# Patient Record
Sex: Female | Born: 1960
Health system: Southern US, Community
[De-identification: ages and names within clinical notes are randomized; demographics above are authoritative.]

## PROBLEM LIST (undated history)

## (undated) DIAGNOSIS — M779 Enthesopathy, unspecified: Secondary | ICD-10-CM

## (undated) DIAGNOSIS — E119 Type 2 diabetes mellitus without complications: Secondary | ICD-10-CM

## (undated) DIAGNOSIS — G8929 Other chronic pain: Secondary | ICD-10-CM

## (undated) DIAGNOSIS — K219 Gastro-esophageal reflux disease without esophagitis: Secondary | ICD-10-CM

## (undated) DIAGNOSIS — M549 Dorsalgia, unspecified: Secondary | ICD-10-CM

## (undated) DIAGNOSIS — M778 Other enthesopathies, not elsewhere classified: Secondary | ICD-10-CM

## (undated) DIAGNOSIS — I10 Essential (primary) hypertension: Secondary | ICD-10-CM

## (undated) HISTORY — PX: BREAST BIOPSY: SHX20

## (undated) HISTORY — PX: ABDOMINAL HYSTERECTOMY: SHX81

## (undated) HISTORY — PX: ABDOMINAL SURGERY: SHX537

---

## 2007-04-05 ENCOUNTER — Ambulatory Visit: Payer: Self-pay | Admitting: Unknown Physician Specialty

## 2007-04-08 ENCOUNTER — Inpatient Hospital Stay: Payer: Self-pay | Admitting: Unknown Physician Specialty

## 2007-04-19 ENCOUNTER — Inpatient Hospital Stay: Payer: Self-pay | Admitting: Unknown Physician Specialty

## 2008-01-01 ENCOUNTER — Emergency Department: Payer: Self-pay | Admitting: Internal Medicine

## 2008-11-08 ENCOUNTER — Emergency Department: Payer: Self-pay | Admitting: Internal Medicine

## 2008-11-15 LAB — METABOLIC PANEL, COMPREHENSIVE
A-G Ratio: 0.5 — ABNORMAL LOW (ref 1.1–2.2)
ALT (SGPT): 40 U/L (ref 30–65)
AST (SGOT): 67 U/L — ABNORMAL HIGH (ref 15–37)
Albumin: 2.9 g/dL — ABNORMAL LOW (ref 3.5–5.0)
Alk. phosphatase: 134 U/L (ref 50–136)
Anion gap: 5 mmol/L (ref 5–15)
BUN/Creatinine ratio: 15 (ref 12–20)
BUN: 26 MG/DL — ABNORMAL HIGH (ref 6–20)
Bilirubin, total: 0.3 MG/DL (ref <1.0)
CO2: 27 MMOL/L (ref 21–32)
Calcium: 8.6 MG/DL (ref 8.5–10.1)
Chloride: 101 MMOL/L (ref 97–108)
Creatinine: 1.7 MG/DL — ABNORMAL HIGH (ref 0.6–1.3)
GFR est AA: 41 mL/min/{1.73_m2} — ABNORMAL LOW (ref >60)
GFR est non-AA: 34 mL/min/{1.73_m2} — ABNORMAL LOW (ref >60)
Globulin: 5.6 g/dL — ABNORMAL HIGH (ref 2.0–4.0)
Glucose: 106 MG/DL — ABNORMAL HIGH (ref 50–100)
Potassium: 4.7 MMOL/L (ref 3.5–5.1)
Protein, total: 8.5 g/dL — ABNORMAL HIGH (ref 6.4–8.2)
Sodium: 133 MMOL/L — ABNORMAL LOW (ref 136–145)

## 2008-11-15 LAB — CK W/ REFLX CKMB: CK: 991 U/L — ABNORMAL HIGH (ref 21–215)

## 2008-11-15 LAB — CBC WITH AUTOMATED DIFF
ABS. BASOPHILS: 0 10*3/uL (ref 0.0–0.1)
ABS. EOSINOPHILS: 0 10*3/uL (ref 0.0–0.4)
ABS. LYMPHOCYTES: 0.8 10*3/uL (ref 0.8–3.5)
ABS. MONOCYTES: 0.3 10*3/uL (ref 0–1.0)
ABS. NEUTROPHILS: 5.8 10*3/uL (ref 1.8–8.0)
BASOPHILS: 0 % (ref 0–1)
EOSINOPHILS: 0 % (ref 0–7)
HCT: 33 % — ABNORMAL LOW (ref 35.0–47.0)
HGB: 11.3 g/dL — ABNORMAL LOW (ref 11.5–16.0)
LYMPHOCYTES: 12 % (ref 12–49)
MCH: 30.7 PG (ref 26.0–34.0)
MCHC: 34.2 g/dL (ref 30.0–35.0)
MCV: 89.7 FL (ref 80.0–99.0)
MONOCYTES: 4 % — ABNORMAL LOW (ref 5–13)
NEUTROPHILS: 84 % — ABNORMAL HIGH (ref 32–75)
PLATELET: 267 10*3/uL (ref 150–400)
RBC: 3.68 M/uL — ABNORMAL LOW (ref 3.80–5.20)
RDW: 15.4 % — ABNORMAL HIGH (ref 11.5–14.5)
WBC: 6.9 10*3/uL (ref 3.6–11.0)

## 2008-11-15 LAB — URINALYSIS W/ REFLEX CULTURE
Bacteria: NEGATIVE /HPF
Bilirubin: NEGATIVE
Glucose: NEGATIVE MG/DL
Ketone: NEGATIVE MG/DL
Leukocyte Esterase: NEGATIVE
Nitrites: NEGATIVE
Protein: 300 MG/DL — AB
Specific gravity: 1.014 (ref 1.003–1.030)
Urobilinogen: 0.2 EU/DL (ref 0.2–1.0)
pH (UA): 6 (ref 5.0–8.0)

## 2008-11-15 LAB — CK-MB,QUANT.
CK - MB: 1.9 NG/ML (ref 0–5)
CK-MB Index: 0.2 (ref 0–2.5)

## 2008-11-15 LAB — GRAM STAIN

## 2008-11-15 LAB — TROPONIN I: Troponin-I, Qt.: <0.04 ng/mL (ref 0.0–0.05)

## 2008-11-15 LAB — NT-PRO BNP: NT pro-BNP: 1249 PG/ML — ABNORMAL HIGH (ref 0–125)

## 2008-11-15 LAB — D DIMER: D-dimer: 3.46 MG/L — ABNORMAL HIGH (ref 0.0–3.0)

## 2008-11-15 NOTE — Consults (Signed)
Name: Cheyenne Owens, Cheyenne Owens Admitted: 11/15/2008  MR #: [id number] DOB: 04-14-1961  Account #: [id number] Age: 47  Consultant: Casimiro Needle A. Yug Loria, M.D. Location:     CONSULTATION REPORT    DATE OF CONSULTATION: 11/15/2008  REFERRING PHYSICIAN:      PROBLEM: Pneumonia.    HISTORY OF PRESENT ILLNESS: The patient is a 47 year old black female who  presents for evaluation of increasing cough and dyspnea. She states that  she was in her usual state of health until last Thursday. She developed a  scratchy throat. Through the weekend, she had almost persistent  temperature of 101.3 responsive only to Advil. She has had originally green  and yellow and then pinkish sputum production. Her cough is largely  nonproductive at this time. She has a history of reactive airway disease  and asthma. She is normally on Advair. She normally has good control of her  reactive airway disease. She has not received the flu vaccination or the  H1N1 vaccine. She has had some chest aching with cough but not otherwise.  She has chronic lower extremity edema. She has no foreign travel history.  She remains a nonsmoker. [facility] and [facility]  steroids and azithromycin. She is compliant with her CPAP device.    PAST MEDICAL HISTORY: Obstructive sleep apnea, obesity, hypertension,  atrial fibrillation, cardiomegaly, asthma.    MEDICATIONS AT HOME:  1. Advair.  2. Prednisone.  3. Azithromycin.  4. Labetalol.    ALLERGIES: The patient states that she developed skin flaking with  PENICILLIN.    SOCIAL HISTORY: The patient is unmarried. She has no children. She cares  for her mother with dementia and sister who currently has renal failure on  dialysis. She is a nonsmoker and does not drink alcohol. She works as a  Biomedical engineer.    FAMILY HISTORY: Noncontributory.    REVIEW OF SYSTEMS: Constitutional, HEENT, cardiovascular, pulmonary,  gastrointestinal, genitourinary, musculoskeletal, integument, neurologic,   and endocrinologic review of systems as per the history of the present  illness with the following additions. The patient has had urinary  incontinence with coughing. She has frequent retention of fluid within the  lower extremities. She remains overweight. Otherwise standard findings.    PHYSICAL EXAMINATION:  GENERAL: The patient is a morbidly obese black female resting comfortably.  VITAL SIGNS: Blood pressure 179/100, pulse 82, respirations 26,  temperature 99.7.  HEENT: Normocephalic, atraumatic. Pupils equal, round and reactive to  light. Sclerae anicteric. Conjunctivae not injected. Ears and nose without  drainage. Mouth: No oral lesions.  NECK: No lymphadenopathy or stridor.  BACK: Without CVA tenderness or spinal tenderness.  CHEST: Equal breath sounds bilaterally throughout. End-expiratory wheeze,  occasional rales. No rhonchi.  CARDIAC: Regular. Faint S1, S2. No appreciable murmur, rub, or gallop.  ABDOMEN: Obese, soft, nontender. No rigidity.  EXTREMITIES: Without cyanosis or clubbing. There is bilateral lower  extremity pedal edema to the knees.  SKIN: Warm and dry. No generalized rash or lesions.  NEUROLOGICAL: The patient is alert and oriented. She moves all extremities.      IMAGING: Chest x-ray is reviewed. The patient has extensive bilateral  infiltrates, right greater than left, and cardiomegaly without significant  effusions.    LABORATORY DATA: Sodium 137, potassium 4.7, chloride 101, bicarbonate 28,  BUN 26, creatinine 1.7, glucose 106. White count 6.9, hemoglobin 11.3,  platelets 267,000. D-dimer is 3.46.    ASSESSMENT:  1. Multilobar pneumonia.  2. Respiratory failure with hypoxemia.  3. Morbid obesity.  4. Obstructive sleep apnea.  5. Hypertension.  6. Asthma with mild exacerbation.  7. Atrial fibrillation.  8. Lack of prior flu vaccination.    PLAN:  1. Broad-spectrum antibiotics.  2. Supplement oxygen therapy.  3. Followup chest x-ray.   4. BIPAP at night. The patient has been advised to have her sister bring  her BIPAP machine to the hospital.  5. Wean steroids as able.  6. Cough suppressants.  7. Sputum Gram stain and culture.  8. Influenza vaccinations and Pneumococcal vaccine prior to discharge.  9. Bronchodilator therapy for wheezing.    The patient is at high risk for progressive respiratory failure. Chest  x-ray is very worrisome. She is not thought to have opportunistic  infection or aspiration. Congestive heart failure is thought to be less  likely. She may require bronchoscopy.    Thank you for this consultation.          Electronically Signed By  Rudy Jew Lucienne Capers, M.D. 11/27/2008 22:07    Casimiro Needle A. Tamarcus Condie, M.D.    cc: Fredirick Lathe, M.D.   Casimiro Needle A. Liam Graham, M.D.      MAM/WMX; D: 11/15/2008 3:02 P; T: 11/15/2008 4:08 P; DOC# [id number]; Job#  [id number]

## 2008-11-15 NOTE — H&P (Signed)
Name: Cheyenne Owens, Cheyenne Owens Admitted: 11/15/2008  MR #: [id number] DOB: 05-17-1961  Account #: [id number] Age: 47  Physician: Shireen Quan, M.D. Location: [id number]     HISTORY PHYSICAL      PRIMARY CARE PHYSICIAN: [facility].    CHIEF COMPLAINT: Cough.    HISTORY OF PRESENT ILLNESS: This 47 year old female is admitted for acute  hypoxic respiratory failure with a room air oxygen saturation of 86%. She  had been found in the emergency department to have pneumonia and possibly  congestive heart failure and was started on Solu-Medrol, nebulized  bronchodilators, Levaquin, and Lasix.    The patient has a long-standing history of asthma and uses Advair  routinely. Last week, she had developed a sore throat and cough which had  led to an asthma exacerbation. She had subsequently developed yellow/green  sputum. She had been seen at [facility] on November 19th and was  discharged with prescriptions for Z-PAK, prednisone Dosepak, albuterol and  Advair. From November 20th through the 23rd, she ran fevers in the 101.5  range which she treated with Tylenol and Advil. The fever subsequently  resolved on November 23rd. Unfortunately, she continued to have a frequent  cough. Not only has that resulted in marked musculoskeletal pain in her  chest and abdominal wall, but it is also resulted in stress urinary  incontinence with the urine at times flowing out "like a fountain." In  addition, the patient has had shortness of breath. She notes that her  sputum has developed a pink tinge to it. Because of her worsening  shortness of breath, she presented to the emergency department.    She has had no midsternal chest heaviness, nausea, vomiting or diaphoresis.  She has had no nausea, vomiting, or diarrhea.    PAST MEDICAL HISTORY:  1. Morbid obesity.  2. Hypertension.  3. Chronic kidney disease, stage 3 with a baseline creatinine from  February 2008 of 1.6.  4. Cardiomegaly diagnosed at least one year ago but never evaluated.   5. Gout which was brought on by Lasix use.    MEDICATIONS PRIOR TO ADMISSION:  1. Labetalol 200 mg 2 b.i.d.  2. Advair Diskus.  3. Medrol Dosepak.  4. Albuterol.  5. The patient has completed her Z-PAK.    ALLERGIES: PENICILLIN.    SOCIAL HISTORY: The patient does not use tobacco or alcohol. She lives  with her mother and sister.    FAMILY HISTORY: Her mother has severe Alzheimer's disease. Her sister has  diabetes, was recently admitted to [facility] with  sepsis, and was started on hemodialysis therapy.    REVIEW OF SYSTEMS: CONSTITUTIONAL: Positive for fevers. EYES: No  blurred vision. EARS, NOSE, MOUTH AND THROAT: No sinus discharge.  Positive for a tickle in her throat. CARDIAC: No midsternal chest  heaviness. RESPIRATORY: Positive for productive cough and shortness of  breath. GI: No nausea or vomiting. GU: Positive for stress  incontinence. MUSCULOSKELETAL: No muscle or joint pains. NEURO: No  headaches or loss of consciousness. SKIN: No rashes or lesions. All  other systems negative.    PHYSICAL EXAMINATION:  GENERAL: Reveals a morbidly obese female who is alert and oriented x3, and  in no acute distress, lying still and wearing supplemental oxygen. There  is no accessory muscle use. She is not anxious or agitated.  VITAL SIGNS: Included temperature 99.7, pulse 82, respirations 26, blood  pressure 179/101, oxygen saturation 86% on room air.  HEENT: Head normocephalic, atraumatic. Pupils equal, round, reactive  to  light. Extraocular muscles intact. No conjunctival drainage or scleral  icterus. No external lesions on the nose or ears. Oral mucosa moist.  NECK: Supple. Trachea midline. No masses or palpable lymph nodes.  Carotids 2+ bilaterally.  HEART: Regular in rate and rhythm without murmurs, gallops, or rubs.  LUNGS: Reveal crackles bilaterally.  ABDOMEN: Obese, soft, nontender. Bowel sounds present. No masses or  palpable organomegaly.  EXTREMITIES: Without pretibial or pedal edema.   SKIN: Without rashes or lesions.  NEURO: Cranial nerves II-XII intact. Motor exam nonfocal.    LABORATORY DATA: White count 6.9, hemoglobin 11.3, hematocrit 33.0,  platelets 267, MCV 89.7, 84 segs, 12 lymphs, 4 monos. Sodium 133,  potassium 4.7, chloride 101, bicarb 27, glucose 106, BUN 26, creatinine  1.7. The BUN and creatinine in February 2008 were 19 and 1.6. Liver  enzymes notable for AST of 67. CPK 991 with MB index of 0.2, troponin-I  less than 0.04. D-dimer 3.46. Urinalysis was clear. Chest x-ray showed  diffuse infiltrates which involved almost the entire right lung and  primarily the bottom half of the left lung. EKG showed sinus rhythm,  nonspecific ST changes.    ASSESSMENT AND PLAN:  1. Acute hypoxic respiratory failure. The patient will be continued on  supplemental oxygen.  2. Asthma. The patient has no wheezing at this time. She will be  continued on Solu-Medrol and nebulized bronchodilators in view of her acute  hypoxic respiratory failure.  3. Pneumonia. Levaquin will be continued and sputum studies requested.  Consultation will be placed to pulmonary for possible bronchoscopy.  4. Cardiomegaly. An echocardiogram will be obtained to evaluate heart  function. The patient may very well have core pulmonale in view of her  morbid obesity and likely sleep apnea. Nevertheless, because of the lung  findings suggestive of congestive heart failure, the patient will be  continued on Lasix therapy. No ACE inhibitors will be initiated in spite  of the possibility of congestive heart failure due to the risk of causing  acute renal in this patient with chronic renal failure who is being  diuresed.  5. Hypertension. For the present time, the patient will continue on her  usual labetalol dose.  6. Chronic kidney disease stage 3. Will need to monitor the patient's  renal function closely as she is diuresed.  7. Anemia. This may be related to her chronic kidney disease. Her MCV   suggests that her iron stores are adequate.  8. Prophylaxis. Protonix will be ordered for GI protection and sequential  compression devices will be ordered for DVT prophylaxis. I would like to  avoid Lovenox in view of the patient's sputum being pink tinged and in view  of the possibility of her needing to have bronchoscopy.  9. Morbid obesity. Unfortunately, the patient's long-term outlook is very  poor due to the complications which occur as a result of morbid obesity.    Total time here was 80 minutes.          E-Signed By  Shireen Quan, M.D. 11/15/2008 19:26    Shireen Quan, M.D.    cc: Shireen Quan, M.D.        RT/WMX; D: 11/15/2008 4:17 A; T: 11/15/2008 7:55 A; DOC# [id number]; Job#  [id number]

## 2008-11-15 NOTE — Procedures (Signed)
Name: Cheyenne Owens, Cheyenne Owens Admitted: 11/15/2008  MR #: [id number] DOB: 07-24-1961  Account #: [id number] Age 47  Ref MD: Location: [id number]     ECHOCARDIOGRAPHY REPORT    STUDY DATE: 11/15/2008    PROCEDURE: 2D and Doppler echocardiogram.    INDICATIONS: Congestive heart failure, respiratory failure.    FINDINGS:  1. Technically suboptimal study secondary to poor acoustic window.  2. Normal left ventricular size and systolic function with an estimated  ejection fraction of 60% to 65%. No obvious regional wall motion  abnormalities are seen.  3. The right ventricle is not well seen but does not appear significantly  enlarged.  4. The left atrium is at the upper limit of normal in size.  5. The aortic root does not appear enlarged.  6. The aortic valve is unremarkable in appearance without evidence of  significant stenosis or regurgitation.  7. The mitral valve is mildly thickened with evidence of some mitral  annular calcification and mild mitral regurgitation.  8. The mitral valve is unremarkable in appearance with mild mitral  regurgitation. Estimated PA systolic pressure is around 50 mmHg suggesting  mild pulmonary systolic hypertension.  9. No obvious intracardiac masses, thrombi or vegetations.  10. No pericardial effusion is seen.  11. There is Doppler evidence of at least a mild degree of left  ventricular diastolic dysfunction.    SUMMARY: Technically suboptimal study secondary to poor acoustic window.  Grossly normal left ventricular size and systolic function. No significant  valvular abnormalities seen. Mild pulmonary systolic hypertension by  Doppler estimate. Evidence of at least mild left ventricular diastolic  dysfunction by Doppler evaluation.        E-Signed By  Jasmine Awe, M.D. 12/01/2008 22:32    Jasmine Awe, M.D.    cc: Fredirick Lathe, M.D.   Jasmine Awe, M.D.       [facility] Stress Lab*    BKH/WMX; D: 11/15/2008 11:01 A; T: 11/15/2008 3:57 P; DOC# [id number]; Job#  [id number]

## 2008-11-16 LAB — METABOLIC PANEL, BASIC
Anion gap: 9 mmol/L (ref 5–15)
BUN/Creatinine ratio: 23 — ABNORMAL HIGH (ref 12–20)
BUN: 49 MG/DL — ABNORMAL HIGH (ref 6–20)
CO2: 26 MMOL/L (ref 21–32)
Calcium: 8.9 MG/DL (ref 8.5–10.1)
Chloride: 100 MMOL/L (ref 97–108)
Creatinine: 2.1 MG/DL — ABNORMAL HIGH (ref 0.6–1.3)
GFR est AA: 32 mL/min/{1.73_m2} — ABNORMAL LOW (ref >60)
GFR est non-AA: 27 mL/min/{1.73_m2} — ABNORMAL LOW (ref >60)
Glucose: 162 MG/DL — ABNORMAL HIGH (ref 50–100)
Potassium: 3.8 MMOL/L (ref 3.5–5.1)
Sodium: 135 MMOL/L — ABNORMAL LOW (ref 136–145)

## 2008-11-16 LAB — CBC W/O DIFF
HCT: 34 % — ABNORMAL LOW (ref 35.0–47.0)
HGB: 11.9 g/dL (ref 11.5–16.0)
MCH: 30.4 PG (ref 26.0–34.0)
MCHC: 35 g/dL (ref 30.0–35.0)
MCV: 87 FL (ref 80.0–99.0)
PLATELET: 260 10*3/uL (ref 150–400)
RBC: 3.91 M/uL (ref 3.80–5.20)
RDW: 15.1 % — ABNORMAL HIGH (ref 11.5–14.5)
WBC: 8.1 10*3/uL (ref 3.6–11.0)

## 2008-11-16 LAB — MAGNESIUM: Magnesium: 1.8 MG/DL (ref 1.6–2.4)

## 2008-11-17 LAB — CULTURE, RESPIRATORY/SPUTUM/BRONCH W GRAM STAIN: Culture result:: NORMAL

## 2008-11-18 LAB — METABOLIC PANEL, BASIC
Anion gap: 9 mmol/L (ref 5–15)
BUN/Creatinine ratio: 28 — ABNORMAL HIGH (ref 12–20)
BUN: 55 MG/DL — ABNORMAL HIGH (ref 6–20)
CO2: 27 MMOL/L (ref 21–32)
Calcium: 8.9 MG/DL (ref 8.5–10.1)
Chloride: 104 MMOL/L (ref 97–108)
Creatinine: 2 MG/DL — ABNORMAL HIGH (ref 0.6–1.3)
GFR est AA: 34 mL/min/{1.73_m2} — ABNORMAL LOW (ref >60)
GFR est non-AA: 28 mL/min/{1.73_m2} — ABNORMAL LOW (ref >60)
Glucose: 145 MG/DL — ABNORMAL HIGH (ref 50–100)
Potassium: 4.2 MMOL/L (ref 3.5–5.1)
Sodium: 140 MMOL/L (ref 136–145)

## 2008-11-19 NOTE — Discharge Summary (Signed)
Name: Cheyenne Owens, Cheyenne Owens Admitted: 11/15/2008  MR #: [id number] Discharged: 11/18/2008  Account #: [id number] DOB: [birth year]  Physician: Corine Shelter, MD Age 47     DISCHARGE SUMMARY        SUMMARY OF DIAGNOSES: Include:  1. Hypertension.  2. Morbid obesity.  3. Chronic kidney disease, stage III, with baseline creatinine in  February 2008 of 1.6.  4. Cardiomegaly.  5. Gout.  6. Acute hypoxic respiratory failure.  7. Acute asthma with exacerbation.  8. Bilateral pneumonia.  9. Anemia of chronic disease, probably of chronic disease.  10. Obstructive sleep apnea.    MEDICATIONS AT DISCHARGE:  1. Levaquin 500 mg daily for an additional 10 days.  2. Advair Diskus 250/50, 2 times a day.  3. Labetalol 400 mg 3 times a day.  4. Clonidine 0.2 mg 2 times a day.  5. Prednisone 60 mg, taper down to 10 mg over the next 6 days.  6. Mucinex 1200 mg 2 times a day.  7. DuoNeb every 6 hours at home.  DIET: Heart-healthy.    CONSULTANTS THIS ADMISSION: Dr. Rudie Meyer, [facility],  recommended giving pneumonia and flu vaccines, which were given.    The patient was treated for multilobar pneumonia with broad-spectrum  antibiotics and converted to oral Levaquin at discharge.    DIET: Heart-healthy.    ACTIVITY: As tolerates, stay out of work over the next week and convalesce  at home.    DISCHARGE PHYSICAL EXAMINATION: VITAL SIGNS: At discharge, blood pressure  182/98, temperature 98, pulse 86, respiratory rate 18, saturations 95% on 2  liters, the patient will go home on her BiPAP mask in the evening and home  O2 of 2 liters over the next 7 to 10 days, to be titrated as an outpatient.  HEENT: Otherwise unremarkable. NECK: Supple. LUNGS: Few end-expiratory  wheeze and rhonchi, but better air movement. HEART: Regular. ABDOMEN:  Benign. EXTREMITIES: No edema. NEUROLOGIC: Alert and oriented x3.  GENERAL: Morbidly obese.    LABORATORY DATA: At discharge on November 16, 2008, white count 8.1,   hemoglobin 11.9, and platelets 260,000. Sodium 140, potassium 4.2,  chloride 104, bicarbonate 27, BUN 55, creatinine 2.0, and calcium 8.9. BUN  and creatinine ranged from 1.6 to 2.1. These labs were obtained on  November 18, 2008.    DISCHARGE PLAN: She is to watch concentrated sweets in her diet and should  have a hemoglobin A1c checked as an outpatient as well. She will have  [facility]up [facility] Lianne Cure at the [facility]  location, to follow up accelerated hypertension, chronic kidney disease,  and followup bilateral pneumonia. She should have laboratory studies  including a BMP, CBC, and chest x-ray in the next 7 to 10 days with  cautious followup. Again, she was set up with home O2. She has her BiPAP  mask at home. [employer] provided Levaquin and [employer]. Her other medications can be filled at the $4 plan at [facility].    Greater than 30 minutes spent in coordinating care.            E-Signed By  Corine Shelter, MD 11/20/2008 07:22    Corine Shelter, MD    cc: Corine Shelter, MD      COPY TO: Dr. Melene Muller at [facility], [facility].      JMF/FI2; D: 11/19/2008 6:13 P; T: 11/19/2008 6:27 P; DOC# [id number]; Job#  [id number]

## 2008-11-20 LAB — CULTURE, BLOOD, PAIRED

## 2011-04-17 ENCOUNTER — Ambulatory Visit: Payer: Self-pay | Admitting: Unknown Physician Specialty

## 2011-08-01 ENCOUNTER — Ambulatory Visit: Payer: Self-pay | Admitting: Gastroenterology

## 2011-11-07 ENCOUNTER — Emergency Department: Payer: Self-pay | Admitting: Unknown Physician Specialty

## 2012-05-06 ENCOUNTER — Ambulatory Visit: Payer: Self-pay

## 2012-08-30 ENCOUNTER — Emergency Department: Payer: Self-pay | Admitting: Emergency Medicine

## 2013-01-03 LAB — COMPREHENSIVE METABOLIC PANEL
Albumin: 4.5 g/dL (ref 3.4–5.0)
Anion Gap: 12 (ref 7–16)
BUN: 20 mg/dL — ABNORMAL HIGH (ref 7–18)
Bilirubin,Total: 0.4 mg/dL (ref 0.2–1.0)
Chloride: 90 mmol/L — ABNORMAL LOW (ref 98–107)
Co2: 23 mmol/L (ref 21–32)
EGFR (African American): >60
EGFR (Non-African Amer.): >60
Glucose: 702 mg/dL (ref 65–99)
Sodium: 125 mmol/L — ABNORMAL LOW (ref 136–145)
Total Protein: 8.2 g/dL (ref 6.4–8.2)

## 2013-01-03 LAB — CBC
HGB: 15.4 g/dL (ref 12.0–16.0)
MCH: 26.5 pg (ref 26.0–34.0)
MCV: 80 fL (ref 80–100)
RDW: 17.2 % — ABNORMAL HIGH (ref 11.5–14.5)
WBC: 7.7 10*3/uL (ref 3.6–11.0)

## 2013-01-03 LAB — BASIC METABOLIC PANEL WITH GFR
Anion Gap: 9
BUN: 12 mg/dL
Calcium, Total: 8.2 mg/dL — ABNORMAL LOW
Chloride: 107 mmol/L
Co2: 21 mmol/L
Creatinine: 0.57 mg/dL — ABNORMAL LOW
EGFR (African American): >60
EGFR (Non-African Amer.): >60
Glucose: 273 mg/dL — ABNORMAL HIGH
Osmolality: 283
Potassium: 3.8 mmol/L
Sodium: 137 mmol/L

## 2013-01-03 LAB — URINALYSIS, COMPLETE
Bilirubin,UR: NEGATIVE
Blood: NEGATIVE
Leukocyte Esterase: NEGATIVE
Nitrite: NEGATIVE
WBC UR: 1 /HPF (ref 0–5)

## 2013-01-03 LAB — HEMOGLOBIN A1C: Hemoglobin A1C: 10 % — ABNORMAL HIGH

## 2013-01-04 ENCOUNTER — Observation Stay: Payer: Self-pay | Admitting: Internal Medicine

## 2013-05-09 ENCOUNTER — Ambulatory Visit: Payer: Self-pay | Admitting: Internal Medicine

## 2014-03-21 LAB — AMB EXT CREATININE: Creatinine, External: 3.14

## 2014-10-07 ENCOUNTER — Inpatient Hospital Stay
Admit: 2014-10-07 | Discharge: 2014-10-22 | Disposition: E | Payer: PRIVATE HEALTH INSURANCE | Attending: Emergency Medicine

## 2014-10-07 DIAGNOSIS — J9601 Acute respiratory failure with hypoxia: Secondary | ICD-10-CM

## 2014-10-07 MED ORDER — EPINEPHRINE 0.1 MG/ML SYRINGE
0.1 mg/mL | INTRAMUSCULAR | Status: AC
Start: 2014-10-07 — End: 2014-10-07
  Administered 2014-10-07: 21:00:00 via INTRAVENOUS

## 2014-10-07 MED ORDER — EPINEPHRINE 0.1 MG/ML SYRINGE
0.1 mg/mL | INTRAMUSCULAR | Status: AC | PRN
Start: 2014-10-07 — End: 2014-10-07
  Administered 2014-10-07 (×3): via INTRAVENOUS

## 2014-10-07 MED ORDER — SODIUM CHLORIDE 0.9% BOLUS IV
0.9 % | Freq: Once | INTRAVENOUS | Status: AC
Start: 2014-10-07 — End: 2014-10-07
  Administered 2014-10-07: 21:00:00 via INTRAVENOUS

## 2014-10-07 MED ORDER — SODIUM CHLORIDE 0.9% BOLUS IV
0.9 % | INTRAVENOUS | Status: AC | PRN
Start: 2014-10-07 — End: 2014-10-07
  Administered 2014-10-07: 21:00:00 via INTRAVENOUS

## 2014-10-07 MED ORDER — EPINEPHRINE (PF) 1 MG/ML INJECTION
1 mg/mL ( mL) | INTRAMUSCULAR | Status: DC
Start: 2014-10-07 — End: 2014-10-08
  Administered 2014-10-07: 22:00:00 via INTRAMUSCULAR

## 2014-10-07 MED ORDER — EPINEPHRINE 0.1 MG/ML SYRINGE
0.1 mg/mL | INTRAMUSCULAR | Status: AC | PRN
Start: 2014-10-07 — End: 2014-10-07
  Administered 2014-10-07: 21:00:00 via INTRAVENOUS

## 2014-10-07 MED ORDER — EPINEPHRINE 0.1 MG/ML SYRINGE
0.1 mg/mL | INTRAMUSCULAR | Status: AC
Start: 2014-10-07 — End: 2014-10-07
  Administered 2014-10-07: 21:00:00

## 2014-10-07 MED FILL — SODIUM CHLORIDE 0.9 % IV: INTRAVENOUS | Qty: 1000

## 2014-10-07 MED FILL — EPINEPHRINE 0.1 MG/ML SYRINGE: 0.1 mg/mL | INTRAMUSCULAR | Qty: 20

## 2014-10-07 NOTE — ED Notes (Signed)
Cheyenne Owens was paged at this time.

## 2014-10-07 NOTE — ED Notes (Signed)
Per [facility] the patient is not a candidate for eye donation.
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
## 2014-10-07 NOTE — ED Notes (Signed)
[facility]

## 2014-10-07 NOTE — ED Notes (Addendum)
Dr. Hetty ElyHeimbach is at bedside with ultrasound at this time. CPR is being held at this time. Dr. Hetty ElyHeimbach, Dr. Daleen BoSalley, Dr. Bryson HaKwan is at bedside. Pt has a pulse at this time. Cycle BP cuff at this time. Respiratory is continually bagging pt at this time.

## 2014-10-07 NOTE — ED Notes (Signed)
Pt's family members bedside with Dr. Hetty ElyHeimbach and Gavin Poundeborah from [facility].
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
## 2014-10-07 NOTE — ED Notes (Signed)
Pt arrives CPR was in progress 0 pulse.

## 2014-10-07 NOTE — Progress Notes (Signed)
Paged by Barnett Abuerecia to assist family of Ms. Manson PasseyBrown.  Consulted with RN and escorted family to conference room.  Offered a supportive and spritual presence to Ms. Doro's sister, brother, and sister-in-law.  Remained with them as Physican broke to the news of Ms. Coller's death.  Accompanied family to view Ms. Manson PasseyBrown.  Offered a gentle presence and affirmation.  Ms. Manson PasseyBrown and her family are [facility]; Ms. Manson PasseyBrown had a lovely singing voice that she often shared in worship.  The family is still grieving the loss of their mother about a year ago.  Provided opportunity for life review.  Her family was appropriately grieving and tearful.  She and her sister had a special bond and shared a home together.  Shared in prayer of commendation with family and RN Barnett Abuerecia.  Rev. Pollyann Samplesebbie Carlton, PRN Chaplain  [phone]  [phone])
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
## 2014-10-07 NOTE — ED Notes (Signed)
Dr. Hetty ElyHeimbach is attempting central line at this time.

## 2014-10-08 MED FILL — SODIUM CHLORIDE 0.9 % IV: INTRAVENOUS | Qty: 1

## 2014-10-08 MED FILL — EPINEPHRINE 1 MG/ML IJ SOLN: 1 mg/mL | INTRAMUSCULAR | Qty: 1

## 2014-10-08 MED FILL — BD POSIFLUSH NORMAL SALINE 0.9 % INJECTION SYRINGE: INTRAMUSCULAR | Qty: 50

## 2014-10-08 MED FILL — EPINEPHRINE 0.1 MG/ML SYRINGE: 0.1 mg/mL | INTRAMUSCULAR | Qty: 50

## 2014-10-22 DEATH — deceased

## 2014-11-10 ENCOUNTER — Ambulatory Visit: Payer: Self-pay | Admitting: Physician Assistant

## 2014-12-01 ENCOUNTER — Ambulatory Visit: Payer: Self-pay | Admitting: Podiatry

## 2014-12-29 ENCOUNTER — Ambulatory Visit: Payer: Self-pay | Admitting: Physician Assistant

## 2014-12-29 ENCOUNTER — Ambulatory Visit: Payer: Self-pay | Admitting: Podiatry

## 2015-02-02 ENCOUNTER — Ambulatory Visit: Payer: Self-pay | Admitting: Physician Assistant

## 2015-02-09 ENCOUNTER — Ambulatory Visit: Payer: Self-pay

## 2015-02-23 DIAGNOSIS — M7542 Impingement syndrome of left shoulder: Secondary | ICD-10-CM | POA: Insufficient documentation

## 2015-02-23 DIAGNOSIS — M65812 Other synovitis and tenosynovitis, left shoulder: Secondary | ICD-10-CM | POA: Insufficient documentation

## 2015-02-23 DIAGNOSIS — M65912 Unspecified synovitis and tenosynovitis, left shoulder: Secondary | ICD-10-CM | POA: Insufficient documentation

## 2015-04-13 NOTE — H&P (Signed)
PATIENT NAME:  Regina Short, Regina Short MR#:  [id number] DATE OF BIRTH:  November 15, 1961  DATE OF ADMISSION:  01/04/2013  PRIMARY CARE PHYSICIAN: She has no local physician.   REFERRING PHYSICIAN: Dr. Governor Rooks.   CHIEF COMPLAINT: Excessive thirst, dryness of mouth, mild dizziness and blurring of vision.   HISTORY OF PRESENT ILLNESS: The patient is a 54 year old, African American female with healthy past medical history. She is not known to be diabetic. She is not taking any medication for any reason. She states she was fine until the last 24 hours when she noticed that she is getting mild dizziness, mild blurring of vision, excessive thirst and excessive urination. She finally decided to come to the Emergency Department. Here, her blood sugar was checked and it was 702. The patient received IV hydration and was admitted to the hospital for further evaluation and treatment of newly diagnosed severe hyperglycemia that is non-ketotic. This is associated with hyponatremia with a sodium reaching 125. Other than that, the patient denies having any vomiting. No abdominal pain. No fever. No chills.   REVIEW OF SYSTEMS:   CONSTITUTIONAL: Denies any fever. No chills. No night sweats. She has mild fatigue.  EYES: Reports blurring of vision; however, this is getting better over the last couple of hours with the treatment of her hyperglycemia. No double vision.  EARS, NOSE AND THROAT: No hearing impairment. No sore throat. No dysphagia, but reports dry mouth.  CARDIOVASCULAR: No chest pain. No shortness of breath. No peripheral edema. No syncope.  RESPIRATORY: No cough. No shortness of breath. No chest pain.  GASTROINTESTINAL: No abdominal pain, no vomiting and no diarrhea.  GENITOURINARY: Reports polyuria, but no dysuria.  MUSCULOSKELETAL: No joint pain or swelling. No muscular pain or swelling.  INTEGUMENTARY: No skin rash. No ulcers.  NEUROLOGY: No focal weakness. No seizure activity. No headache.  PSYCHIATRY: No  anxiety. No depression.  ENDOCRINE: Reports polyuria and polydipsia. No heat or cold intolerance.  HEMATOLOGY: No easy bruisability. No lymph node enlargement.  SOCIAL HABITS: Nonsmoker. No history of alcohol or drug abuse.    FAMILY HISTORY: She reports her sisters have diabetes mellitus as well as her mother.   SOCIAL HISTORY: She is single and works at [employer].   ADMISSION MEDICATIONS: None.   ALLERGIES: No known drug allergies.   PAST SURGICAL HISTORY: Partial hysterectomy.   PHYSICAL EXAMINATION:  VITAL SIGNS: Blood pressure 144/78, respiratory rate 20, pulse 68, temperature 98.4, 02 saturation was 98%.  GENERAL APPEARANCE: Middle-aged female lying in bed in no acute distress.  HEAD AND NECK: No pallor. No icterus. No cyanosis. Ears: Ear examination revealed normal hearing, no discharge, no lesions. Nasal mucosa was normal without ulcers. No discharge. No bleeding. Oral mucosa was normal without any oral thrush. No ulcers. Dry mucous membranes. Eye examination revealed normal eyelids and conjunctiva. Pupils about 4 to 5 mm, round, equal and reactive to light.  NECK: Supple. Trachea at midline. No thyromegaly. No cervical lymphadenopathy. No masses.  HEART: Normal S1, S2. No S3, S4. No murmur. No gallop. No carotid bruits.  LUNGS: Normal breathing pattern without use of accessory muscles. No rales. No wheezing.  ABDOMEN: Soft without tenderness. No hepatosplenomegaly. No masses. No hernias.  SKIN: No ulcers. No subcutaneous nodules.  MUSCULOSKELETAL: No joint swelling. No clubbing.  NEUROLOGIC: Cranial nerves II through XII are intact. No focal motor deficit.  PSYCHIATRY: The patient is alert and oriented x3. Mood and affect were normal.   LABORATORY FINDINGS: Serum glucose 702, BUN  20, creatinine 1.02, sodium 125, potassium 4.2. Calcium 9.5, total protein 8.2, albumin 4.5, bilirubin 0.4, alkaline phosphatase 194, AST 16 and ALT 27. CBC showed white count of 7000, hemoglobin 15,  hematocrit 46, platelet count 200. Urinalysis showed more than 500 glucose, 1 RBC, 1 white blood cell.   ASSESSMENT:  1.  Hyperosmolar non-ketotic hyperglycemia. 2.  Polyuria and polydipsia secondary to the severe hyperglycemia.  3.  Hyponatremia.  4.  Noted elevated blood pressure, this needs to be further monitored to ensure there is no underlying hypertension.   PLAN: Aggressive IV hydration with normal saline. Follow up on the blood sugar and use sliding scale. Subsequent blood sugar was down to 600, then 492 and then at 310. We will follow up on her electrolytes and sodium in particular and potassium. Consult a dietitian since she is newly diagnosed diabetes mellitus. I would like to mention that her hemoglobin A1c came back now showing a level of 10.   Time Spent in evaluating this patient took more than 50 minutes.   ____________________________ Carney CornersAmir M. Rudene Rearwish, MD amd:aw D: 01/04/2013 02:05:48 ET T: 01/04/2013 07:09:27 ET JOB#: [id number]  cc: Carney CornersAmir M. Rudene Rearwish, MD, <Dictator> Karolee OhsAMIR Dala DockM Theador Jezewski MD ELECTRONICALLY SIGNED 01/05/2013 7:50

## 2015-04-13 NOTE — Consult Note (Signed)
PATIENT NAME:  Regina Short, Regina Short MR#:  [id number] DATE OF BIRTH:  1961-06-23  DATE OF CONSULTATION:  01/04/2013  REFERRING PHYSICIAN:  Enid Baas, MD  CONSULTING PHYSICIAN:  A. Wendall Mola, MD  CHIEF COMPLAINT: New onset diabetes.   HISTORY OF PRESENT ILLNESS: This is a 54 year old female with no significant past medical history who presented early this morning to the ED with complaints of dizziness, blurred vision, increased thirst and increased urination. Onset of symptoms was the day she presented, in the morning. She had had no recent illnesses. No recent known exposure to glucocorticoids. On presenting to the ED, initial blood sugar was 700. She was not acidotic. She was initiated on IV fluids and given subcutaneous insulin. Her A1c has been found to be 10%. This is consistent with nonketotic severe hyperglycemia.   PAST MEDICAL HISTORY: None.   PAST SURGICAL HISTORY: Hysterectomy.   SOCIAL HISTORY: The patient smokes 1/2 pack of cigarettes per day. She drinks alcohol at holidays only. She works first shift, she is a Writer.   FAMILY HISTORY: Parents as well as several sisters all have diabetes. Her sister and her daughter are both at the bedside. Her sister is fairly knowledgeable about diabetes, hers is diet controlled.   ALLERGIES: No known drug allergies.   OUTPATIENT MEDICATIONS: None.  REVIEW OF SYSTEMS:  GENERAL: No weight loss. No fever.  HEENT: Blurred vision has improved. No sore throat.  NECK: No neck pain. No dysphagia.  CARDIAC: No chest pain, no palpitations.  PULMONARY: No cough. No shortness of breath.   GASTROINTESTINAL: No abdominal pain. She had diarrhea after being given glyburide and metformin this morning. Her appetite is good.  EXTREMITIES: She denies leg swelling.  SKIN: She denies rash or pruritus.  ENDOCRINE: She denies heat or cold intolerance.  GENITOURINARY: She denies dysuria. She has had polyuria. No hematuria.  RHEUMATOLOGIC: She denies  myalgias or arthralgias.   PHYSICAL EXAMINATION: VITAL SIGNS: Height 65.9 inches, weight 160 pounds, BMI 25.8. Temperature 97.9, pulse 51, respirations 18, blood pressure 119/68, pulse ox 97% on room air.  GENERAL: Well-developed, well-nourished African American female in no acute distress.  HEENT: EOMI. No proptosis.  Oropharynx is clear. Mucous membranes moist.  NECK: Supple.  CARDIAC: Regular rate and rhythm. No carotid bruit.  PULMONARY: Clear to auscultation bilaterally. No rales or wheeze. Good inspiratory effort.  ABDOMEN: Diffusely soft, nontender, nondistended.  EXTREMITIES: No edema is present.  SKIN: No rash or dermatopathy is present.  PSYCHIATRIC: Alert and oriented x3, calm and cooperative.  NEUROLOGIC: Nonfocal.   LABORATORY DATA: Glucose 273, BUN 12, creatinine 0.57, sodium 137, potassium 3.8, chloride 107, CO2 21, calcium 8.2. Hemoglobin A1c 10%. Labs drawn 01/03/2013 showed glucose 702, BUN 20, creatinine 1.02, sodium 125, albumin 4.5, AST 16, ALT 27, alkaline phosphatase 194. Hematocrit 46.9, WBC 7.7. Urine with glucose greater than 500, ketones trace, negative leukocyte esterase, negative nitrite.   ASSESSMENT: The patient is a 54 year old female with severe hyperglycemia in setting of new onset diabetes. Blood sugars have improved with subcutaneous insulin and initiation of glyburide/metformin.   PLAN: 1. I agree with ongoing use of glyburide/metformin.  Will adjust dose to one tab b.i.d. She has already had some GI intolerance with her first dose, and it is likely that if her dose is increased much more she will not be able to tolerate it at all. I encouraged her good compliance. Typically, the GI symptoms improve after consistent use of metformin for one week.  2. The patient  should have diabetic teaching. This has been initiated. She will need to be given a glucometer. We did discuss the importance of self-monitoring of blood glucose. I suggested she check her blood  sugars twice daily.  3. She does not currently have a primary care physician. She plans to establish care with [facility], [facility]. She plans to get an appointment there next week. In the meantime, she should continue her glyburide/metformin one tab b.i.d. and check her blood sugars regularly. I did provide her with my clinic information and asked her to contact me if she has any questions or concerns. She declined a follow-up appointment in my clinic.  4. I would not discharge with insulin. I suspect blood sugar should be fairly controlled on the glyburide/metformin alone.  5. Ongoing outpatient diabetes teaching will be also important; she can be set up for [facility] diabetic classes by her PCP.   Thank you for the kind request for consultation.  ____________________________ A. Wendall MolaMelissa Solum, MD ams:cb D: 01/04/2013 13:02:59 ET T: 01/04/2013 13:18:24 ET JOB#: [id number]  cc: A. Wendall MolaMelissa Solum, MD, <Dictator> [facility] at [facility], [facility] Macy MisA. MELISSA SOLUM MD ELECTRONICALLY SIGNED 01/13/2013 16:11

## 2015-04-13 NOTE — Discharge Summary (Signed)
PATIENT NAME:  Regina Short, Regina Short DATE OF BIRTH:  1961-03-04  DATE OF ADMISSION:  01/04/2013  DATE OF DISCHARGE:  01/04/2013  ADMITTING PHYSICIAN: Dr. Marlaine HindAmir Darwish.   DISCHARGING PHYSICIAN:  Dr. Enid Baasadhika Kashmir Lysaght.   PRIMARY CARE PHYSICIAN:  None, but at the time of discharge she is being set up with [facility] with Dr. Welton FlakesKhan.   CONSULTATIONS IN THE HOSPITAL:  1.  Endocrinology consultation with Dr. Tedd SiasSolum.   DISCHARGE DIAGNOSES: 1.  New onset diabetes mellitus with HbA1c of 10.  2.  Hyponatremia.   DISCHARGE HOME MEDICATIONS:  1.  Lisinopril 5 mg p.o. daily.  2.  Glyburide/metformin 2.5 mg/500 mg 1 tablet p.o. b.i.d. with meals.   DISCHARGE DIET:  ADA 1800 diet.   DISCHARGE ACTIVITY:  As tolerated.     FOLLOWUP INSTRUCTIONS:  PCP followup in 2 weeks. Appointment has been set up with [facility] with Dr. Welton FlakesKhan.   LABS AND IMAGING STUDIES:  Sodium 137, potassium 3.8, chloride 107, bicarb 21, BUN 12, creatinine 0.57, glucose 273, calcium of 8.2.  HbA1c is 10.0.   WBC is 7.7, hemoglobin 15.4, hematocrit 46.9, platelet count 200, this is prior to giving her IV fluids, so I expect it to be further lower after dilution. ALT 27, AST 16, total bili 0.4, albumin 4.5. Urinalysis negative for any protein excretion or infection. Her anion gap was 12 at the time of admission, and her sugars were greater than 700.   BRIEF HOSPITAL COURSE:  The patient is a 54 year old African American female with no significant past medical history, who comes to the hospital secondary to worsening polyuria, polydipsia and weakness, and was found to have sugars greater than 700. 1.  New onset diabetes mellitus. Does have family history of diabetes mellitus. No personal history. Has not had a physician to check her lately.  The sugars were greater than 700. Normal anion gap. Nonketotic hyperglycemia. She was started on oral medication with glyburide and metformin, seen by endocrinologist, Dr.  Tedd SiasSolum, in the hospital.  Her sugars have been in the range of 160 to 200 and is being discharged on oral medications at this time. She will need a strict outpatient followup and would like to see Dr. Welton FlakesKhan from [facility] after discharge. 2.  She also had hyponatremia from elevated sugars and also dehydration on admission, which was treated with IV fluids, and her sodium has improved up to 137 from 125 on admission. Her  course has been otherwise uneventful in the hospital.   She was also started on low dose lisinopril for renal protection with her diabetes.   DISCHARGE CONDITION:  Stable.   DISCHARGE DISPOSITION:  Home.   TIME SPENT ON DISCHARGE:  45 minutes.    ____________________________ Enid Baasadhika Alger Kerstein, MD rk:dm D: 01/04/2013 15:13:00 ET T: 01/04/2013 21:02:06 ET JOB#: [id number]  cc: A. Wendall MolaMelissa Solum, MD Dr. [facility] at [facility] Enid Baasadhika Ivet Guerrieri, MD, <Dictator> Enid BaasADHIKA Marleah Beever MD ELECTRONICALLY SIGNED 01/24/2013 21:15

## 2015-04-13 NOTE — Consult Note (Signed)
Chief Complaint and History:   Referring Physician Dr. Nemiah CommanderKalisetti    Chief Complaint Diabetes   Allergies:  No Known Allergies:   Assessment/Plan:   Assessment/Plan 54 yo F presented to ED with polyuria, increased thirst, generalized weakness and was found to have initial BG of >700. She was admitted with new onset diabetes.  Hgb A1c is 10%. No acidosis or ketosis. Sugars have improved with SQ insulin. She had diarreha after first dose of glyburide-metformin this morning. She is tolerating her diet.  A/ 1. Diabetes, new onset, uncontrolled   2. Tobacco dependence  P/ Agree with use of glyburide-metformin 2.5-500 mg. Would adjust dose to BID. Agree with plan to DC home. Would not continue insulin on discharge. Patient plans to establish care with [facility]. She [facility].   She was counseled to quit smoking.  I am available for follow-up as needed.  Full consult has been dictated.   Electronic Signatures: Raj JanusSolum, Anna M (MD)  (Signed 14-Jan-14 13:04)  Authored: Chief Complaint and History, ALLERGIES, Assessment/Plan   Last Updated: 14-Jan-14 13:04 by Raj JanusSolum, Anna M (MD)

## 2015-04-16 LAB — SURGICAL PATHOLOGY

## 2015-07-13 ENCOUNTER — Ambulatory Visit
Admission: RE | Admit: 2015-07-13 | Discharge: 2015-07-13 | Disposition: A | Discharge status: Home / Self Care | Payer: No Typology Code available for payment source | Source: Ambulatory Visit | Attending: Nurse Practitioner | Admitting: Nurse Practitioner

## 2015-07-13 ENCOUNTER — Other Ambulatory Visit: Payer: Self-pay | Admitting: Nurse Practitioner

## 2015-07-13 DIAGNOSIS — M238X1 Other internal derangements of right knee: Secondary | ICD-10-CM | POA: Insufficient documentation

## 2015-07-13 DIAGNOSIS — M25561 Pain in right knee: Secondary | ICD-10-CM

## 2015-07-21 ENCOUNTER — Encounter: Payer: Self-pay | Admitting: *Deleted

## 2015-07-21 ENCOUNTER — Emergency Department
Admission: EM | Admit: 2015-07-21 | Discharge: 2015-07-21 | Disposition: A | Discharge status: Home / Self Care | Payer: No Typology Code available for payment source | Attending: Emergency Medicine | Admitting: Emergency Medicine

## 2015-07-21 DIAGNOSIS — R531 Weakness: Secondary | ICD-10-CM | POA: Diagnosis not present

## 2015-07-21 DIAGNOSIS — I10 Essential (primary) hypertension: Secondary | ICD-10-CM | POA: Diagnosis not present

## 2015-07-21 DIAGNOSIS — R519 Headache, unspecified: Secondary | ICD-10-CM

## 2015-07-21 DIAGNOSIS — E119 Type 2 diabetes mellitus without complications: Secondary | ICD-10-CM | POA: Diagnosis not present

## 2015-07-21 DIAGNOSIS — R51 Headache: Secondary | ICD-10-CM | POA: Diagnosis present

## 2015-07-21 DIAGNOSIS — K219 Gastro-esophageal reflux disease without esophagitis: Secondary | ICD-10-CM | POA: Insufficient documentation

## 2015-07-21 HISTORY — DX: Type 2 diabetes mellitus without complications: E11.9

## 2015-07-21 HISTORY — DX: Essential (primary) hypertension: I10

## 2015-07-21 LAB — BASIC METABOLIC PANEL
Anion gap: 8 (ref 5–15)
BUN: 16 mg/dL (ref 6–20)
CO2: 26 mmol/L (ref 22–32)
Calcium: 9.1 mg/dL (ref 8.9–10.3)
Chloride: 101 mmol/L (ref 101–111)
Creatinine, Ser: 0.57 mg/dL (ref 0.44–1.00)
GFR calc non Af Amer: >60 mL/min (ref >60)
Glucose, Bld: 195 mg/dL — ABNORMAL HIGH (ref 65–99)
POTASSIUM: 3.7 mmol/L (ref 3.5–5.1)
SODIUM: 135 mmol/L (ref 135–145)

## 2015-07-21 LAB — URINALYSIS COMPLETE WITH MICROSCOPIC (ARMC ONLY)
Bacteria, UA: NONE SEEN
Bilirubin Urine: NEGATIVE
Glucose, UA: NEGATIVE mg/dL
HGB URINE DIPSTICK: NEGATIVE
Leukocytes, UA: NEGATIVE
NITRITE: NEGATIVE
PH: 5 (ref 5.0–8.0)
PROTEIN: NEGATIVE mg/dL
Specific Gravity, Urine: 1.02 (ref 1.005–1.030)

## 2015-07-21 LAB — CBC
HCT: 37.4 % (ref 35.0–47.0)
HEMOGLOBIN: 12.3 g/dL (ref 12.0–16.0)
MCH: 25.6 pg — ABNORMAL LOW (ref 26.0–34.0)
MCHC: 33 g/dL (ref 32.0–36.0)
MCV: 77.4 fL — ABNORMAL LOW (ref 80.0–100.0)
Platelets: 185 10*3/uL (ref 150–440)
RBC: 4.83 MIL/uL (ref 3.80–5.20)
RDW: 16.7 % — ABNORMAL HIGH (ref 11.5–14.5)
WBC: 6.5 10*3/uL (ref 3.6–11.0)

## 2015-07-21 MED ORDER — FAMOTIDINE 20 MG PO TABS
40.0000 mg | ORAL_TABLET | Freq: Once | ORAL | Status: AC
  Administered 2015-07-21: 40 mg via ORAL
  Filled 2015-07-21: qty 2

## 2015-07-21 MED ORDER — RANITIDINE HCL 150 MG PO CAPS: 150.0000 mg | ORAL_CAPSULE | Freq: Two times a day (BID) | ORAL | Status: DC

## 2015-07-21 MED ORDER — DIPHENHYDRAMINE HCL 25 MG PO CAPS
25.0000 mg | ORAL_CAPSULE | Freq: Once | ORAL | Status: AC
  Administered 2015-07-21: 25 mg via ORAL
  Filled 2015-07-21: qty 1

## 2015-07-21 MED ORDER — METOCLOPRAMIDE HCL 10 MG PO TABS
10.0000 mg | ORAL_TABLET | Freq: Once | ORAL | Status: AC
  Administered 2015-07-21: 10 mg via ORAL
  Filled 2015-07-21: qty 1

## 2015-07-21 MED ORDER — GI COCKTAIL ~~LOC~~
30.0000 mL | ORAL | Status: AC
  Administered 2015-07-21: 30 mL via ORAL
  Filled 2015-07-21: qty 30

## 2015-07-21 NOTE — ED Notes (Signed)
Pt c/o headache on left temporal area aching starting last night 1900 pm took tylenol no relief. Pt c/o nausea after taking tramadol. Pt c/o weakness since headache started.  Pt denies vomiting, chest pain and shortness of breath.

## 2015-07-21 NOTE — ED Provider Notes (Signed)
[facility] Emergency Department Provider Note  ____________________________________________  Time seen: 3:30 AM  I have reviewed the triage vital signs and the nursing notes.   HISTORY  Chief Complaint Headache and Weakness    HPI Regina Short is a 54 y.o. female who complains of headache as well as upper abdominal pain and nausea after taking a tramadol yesterday.She took it around 4 PM for chronic musculoskeletal pain including in the left shoulder and left knee. She doesn't have any rashes or traumas or other injuries. She had not taken tramadol before. A few hours later around 7 PM, she began having a headache with dizziness, it was gradual onset bilateral, as well as upper abdominal pain and nausea without vomiting chest pain shortness of breath diarrhea. No fevers or chills.  She is otherwise been in her usual state of health in no acute symptoms prior to the tramadol ingestion   Past Medical History  Diagnosis Date  . Diabetes mellitus without complication   . Hypertension     There are no active problems to display for this patient.   Past Surgical History  Procedure Laterality Date  . Abdominal hysterectomy      Current Outpatient Rx  Name  Route  Sig  Dispense  Refill  . ranitidine (ZANTAC) 150 MG capsule   Oral   Take 1 capsule (150 mg total) by mouth 2 (two) times daily.   28 capsule   0     Allergies Review of patient's allergies indicates no known allergies.  History reviewed. No pertinent family history.  Social History History  Substance Use Topics  . Smoking status: Former Games developer  . Smokeless tobacco: Never Used  . Alcohol Use: No    Review of Systems  Constitutional: No fever or chills. No weight changes Eyes:No blurry vision or double vision.  ENT: No sore throat. Cardiovascular: No chest pain. Respiratory: No dyspnea or cough. Gastrointestinal: Upper abdominal pain with nausea as above.  No BRBPR or  melena. Genitourinary: Negative for dysuria, urinary retention, bloody urine, or difficulty urinating. Musculoskeletal: Negative for back pain. No joint swelling or pain. Skin: Negative for rash. Neurological: Headache without numbness weakness or paresthesias. Psychiatric:No anxiety or depression.   Endocrine:No hot/cold intolerance, changes in energy, or sleep difficulty.  10-point ROS otherwise negative.  ____________________________________________   PHYSICAL EXAM:  VITAL SIGNS: ED Triage Vitals  Enc Vitals Group     BP 07/21/15 0204 169/83 mmHg     Pulse Rate 07/21/15 0204 67     Resp 07/21/15 0204 18     Temp 07/21/15 0204 98.1 F (36.7 C)     Temp Source 07/21/15 0204 Oral     SpO2 07/21/15 0204 96 %     Weight 07/21/15 0204 160 lb (72.576 kg)     Height 07/21/15 0204  (1.676 m)     Head Cir --      Peak Flow --      Pain Score 07/21/15 0205 10     Pain Loc --      Pain Edu? --      Excl. in GC? --      Constitutional: Alert and oriented. Well appearing and in no distress. Eyes: No scleral icterus. No conjunctival pallor. PERRL. EOMI ENT   Head: Normocephalic and atraumatic.   Nose: No congestion/rhinnorhea. No septal hematoma   Mouth/Throat: MMM, no pharyngeal erythema. No peritonsillar mass. No uvula shift.   Neck: No stridor. No SubQ emphysema. No  meningismus. Hematological/Lymphatic/Immunilogical: No cervical lymphadenopathy. Cardiovascular: RRR. Normal and symmetric distal pulses are present in all extremities. No murmurs, rubs, or gallops. Respiratory: Normal respiratory effort without tachypnea nor retractions. Breath sounds are clear and equal bilaterally. No wheezes/rales/rhonchi. Gastrointestinal: Epigastric and left upper quadrant tenderness, mild. No distention. There is no CVA tenderness.  No rebound, rigidity, or guarding. Genitourinary: deferred Musculoskeletal: Nontender with normal range of motion in all extremities. No joint  effusions.  No lower extremity tenderness.  No edema. Neurologic:   Normal speech and language.  CN 2-10 normal. Motor grossly intact. No pronator drift.  Normal gait. No gross focal neurologic deficits are appreciated.  Skin:  Skin is warm, dry and intact. No rash noted.  No petechiae, purpura, or bullae. Psychiatric: Mood and affect are normal. Speech and behavior are normal. Patient exhibits appropriate insight and judgment.  ____________________________________________    LABS (pertinent positives/negatives) (all labs ordered are listed, but only abnormal results are displayed) Labs Reviewed  BASIC METABOLIC PANEL - Abnormal; Notable for the following:    Glucose, Bld 195 (*)    All other components within normal limits  CBC - Abnormal; Notable for the following:    MCV 77.4 (*)    MCH 25.6 (*)    RDW 16.7 (*)    All other components within normal limits  URINALYSIS COMPLETEWITH MICROSCOPIC ([facility] ONLY) - Abnormal; Notable for the following:    Color, Urine YELLOW (*)    APPearance CLEAR (*)    Ketones, ur TRACE (*)    Squamous Epithelial / LPF 0-5 (*)    All other components within normal limits  CBG MONITORING, ED   ____________________________________________   EKG  Interpreted by me Sinus bradycardia rate of 59, total axis and intervals, normal QRS ST segments and T waves  ____________________________________________    RADIOLOGY    ____________________________________________   PROCEDURES  ____________________________________________   INITIAL IMPRESSION / ASSESSMENT AND PLAN / ED COURSE  Pertinent labs & imaging results that were available during my care of the patient were reviewed by me and considered in my medical decision making (see chart for details).  Labs unremarkable. Patient appears to have had an adverse drug reaction to tramadol including nausea and headache. This is likely to be self-limited. Low suspicion for intracranial hemorrhage  or stroke, meningitis encephalitis, cardiopulmonary pathology, or surgical abdomen. Give her a GI cocktail as well as nausea medicine which I think will help with her GI symptoms as well as headache. She is well-appearing no acute distress with normal vital signs in the ED. We'll discharge her home to follow up with primary care.  ____________________________________________   FINAL CLINICAL IMPRESSION(S) / ED DIAGNOSES  Final diagnoses:  Acute nonintractable headache, unspecified headache type  Gastroesophageal reflux disease without esophagitis      Sharman Cheek, MD 07/21/15 [phone]

## 2015-07-21 NOTE — Discharge Instructions (Signed)
Heartburn  Heartburn is a painful, burning sensation in the chest. It may feel worse in certain positions, such as lying down or bending over. It is caused by stomach acid backing up into the tube that carries food from the mouth down to the stomach (lower esophagus).   CAUSES   · Large meals.  · Certain foods and drinks.  · Exercise.  · Increased acid production.  · Being overweight or obese.  · Certain medicines.  SYMPTOMS   · Burning pain in the chest or lower throat.  · Bitter taste in the mouth.  · Coughing.  DIAGNOSIS   If the usual treatments for heartburn do not improve your symptoms, then tests may be done to see if there is another condition present. Possible tests may include:  · X-rays.  · Endoscopy. This is when a tube with a light and a camera on the end is used to examine the esophagus and the stomach.  · A test to measure the amount of acid in the esophagus (pH test).  · A test to see if the esophagus is working properly (esophageal manometry).  · Blood, breath, or stool tests to check for bacteria that cause ulcers.  TREATMENT   · Your caregiver may tell you to use certain over-the-counter medicines (antacids, acid reducers) for mild heartburn.  · Your caregiver may prescribe medicines to decrease the acid in your stomach or protect your stomach lining.  · Your caregiver may recommend certain diet changes.  · For severe cases, your caregiver may recommend that the head of your bed be elevated on blocks. (Sleeping with more pillows is not an effective treatment as it only changes the position of your head and does not improve the main problem of stomach acid refluxing into the esophagus.)  HOME CARE INSTRUCTIONS   · Take all medicines as directed by your caregiver.  · Raise the head of your bed by putting blocks under the legs if instructed to by your caregiver.  · Do not exercise right after eating.  · Avoid eating 2 or 3 hours before bed. Do not lie down right after eating.  · Eat small meals  throughout the day instead of 3 large meals.  · Stop smoking if you smoke.  · Maintain a healthy weight.  · Identify foods and beverages that make your symptoms worse and avoid them. Foods you may want to avoid include:  ¨ Peppers.  ¨ Chocolate.  ¨ High-fat foods, including fried foods.  ¨ Spicy foods.  ¨ Garlic and onions.  ¨ Citrus fruits, including oranges, grapefruit, lemons, and limes.  ¨ Food containing tomatoes or tomato products.  ¨ Mint.  ¨ Carbonated drinks, caffeinated drinks, and alcohol.  ¨ Vinegar.  SEEK IMMEDIATE MEDICAL CARE IF:  · You have severe chest pain that goes down your arm or into your jaw or neck.  · You feel sweaty, dizzy, or lightheaded.  · You are short of breath.  · You vomit blood.  · You have difficulty or pain with swallowing.  · You have bloody or black, tarry stools.  · You have episodes of heartburn more than 3 times a week for more than 2 weeks.  MAKE SURE YOU:  · Understand these instructions.  · Will watch your condition.  · Will get help right away if you are not doing well or get worse.  Document Released: 04/26/2009 Document Revised: 03/01/2012 Document Reviewed: 05/25/2011  Exit[employer]® Patient Information ©2015 [employer]. This information is   not intended to replace advice given to you by your health care provider. Make sure you discuss any questions you have with your health care provider.

## 2015-07-21 NOTE — ED Notes (Addendum)
Pt c/o headache starting last night. Pt c/o nausea after taking tramadol. Pt c/o weakness since headache started. Pt is somewhat hypertensive in triage. Pt denies vomiting, chest pain and shortness of breath. CBG at home 131 just prior to arrival.

## 2015-07-25 ENCOUNTER — Emergency Department: Payer: No Typology Code available for payment source

## 2015-07-25 ENCOUNTER — Inpatient Hospital Stay
Admission: EM | Admit: 2015-07-25 | Discharge: 2015-07-28 | DRG: 390 | Disposition: A | Discharge status: Home / Self Care | Payer: No Typology Code available for payment source | Attending: Surgery | Admitting: Surgery

## 2015-07-25 ENCOUNTER — Inpatient Hospital Stay: Payer: No Typology Code available for payment source

## 2015-07-25 DIAGNOSIS — Z8249 Family history of ischemic heart disease and other diseases of the circulatory system: Secondary | ICD-10-CM

## 2015-07-25 DIAGNOSIS — R1084 Generalized abdominal pain: Secondary | ICD-10-CM | POA: Insufficient documentation

## 2015-07-25 DIAGNOSIS — I10 Essential (primary) hypertension: Secondary | ICD-10-CM | POA: Diagnosis present

## 2015-07-25 DIAGNOSIS — K219 Gastro-esophageal reflux disease without esophagitis: Secondary | ICD-10-CM | POA: Diagnosis present

## 2015-07-25 DIAGNOSIS — K565 Intestinal adhesions [bands] with obstruction (postprocedural) (postinfection): Principal | ICD-10-CM | POA: Diagnosis present

## 2015-07-25 DIAGNOSIS — E119 Type 2 diabetes mellitus without complications: Secondary | ICD-10-CM | POA: Diagnosis present

## 2015-07-25 DIAGNOSIS — Z87891 Personal history of nicotine dependence: Secondary | ICD-10-CM | POA: Diagnosis not present

## 2015-07-25 DIAGNOSIS — K5669 Other intestinal obstruction: Secondary | ICD-10-CM | POA: Diagnosis not present

## 2015-07-25 DIAGNOSIS — K56609 Unspecified intestinal obstruction, unspecified as to partial versus complete obstruction: Secondary | ICD-10-CM | POA: Diagnosis present

## 2015-07-25 HISTORY — DX: Dorsalgia, unspecified: M54.9

## 2015-07-25 HISTORY — DX: Other chronic pain: G89.29

## 2015-07-25 HISTORY — DX: Gastro-esophageal reflux disease without esophagitis: K21.9

## 2015-07-25 LAB — CBC WITH DIFFERENTIAL/PLATELET
BASOS ABS: 0 10*3/uL (ref 0–0.1)
BASOS PCT: 0 %
EOS ABS: 0.1 10*3/uL (ref 0–0.7)
Eosinophils Relative: 2 %
HCT: 39.5 % (ref 35.0–47.0)
HEMOGLOBIN: 13 g/dL (ref 12.0–16.0)
Lymphocytes Relative: 32 %
Lymphs Abs: 1.9 10*3/uL (ref 1.0–3.6)
MCH: 25.4 pg — ABNORMAL LOW (ref 26.0–34.0)
MCHC: 33 g/dL (ref 32.0–36.0)
MCV: 77 fL — ABNORMAL LOW (ref 80.0–100.0)
Monocytes Absolute: 0.3 10*3/uL (ref 0.2–0.9)
Monocytes Relative: 5 %
Neutro Abs: 3.6 10*3/uL (ref 1.4–6.5)
Neutrophils Relative %: 61 %
Platelets: 173 10*3/uL (ref 150–440)
RBC: 5.14 MIL/uL (ref 3.80–5.20)
RDW: 16.5 % — AB (ref 11.5–14.5)
WBC: 6 10*3/uL (ref 3.6–11.0)

## 2015-07-25 LAB — URINALYSIS COMPLETE WITH MICROSCOPIC (ARMC ONLY)
Bilirubin Urine: NEGATIVE
Glucose, UA: NEGATIVE mg/dL
Hgb urine dipstick: NEGATIVE
LEUKOCYTES UA: NEGATIVE
Nitrite: NEGATIVE
PH: 6 (ref 5.0–8.0)
PROTEIN: NEGATIVE mg/dL
SPECIFIC GRAVITY, URINE: 1.014 (ref 1.005–1.030)

## 2015-07-25 LAB — COMPREHENSIVE METABOLIC PANEL
ALK PHOS: 101 U/L (ref 38–126)
ALT: 15 U/L (ref 14–54)
AST: 20 U/L (ref 15–41)
Albumin: 4.3 g/dL (ref 3.5–5.0)
Anion gap: 9 (ref 5–15)
BUN: 16 mg/dL (ref 6–20)
CO2: 27 mmol/L (ref 22–32)
CREATININE: 0.52 mg/dL (ref 0.44–1.00)
Calcium: 9.6 mg/dL (ref 8.9–10.3)
Chloride: 104 mmol/L (ref 101–111)
GFR calc Af Amer: >60 mL/min (ref >60)
GFR calc non Af Amer: >60 mL/min (ref >60)
Glucose, Bld: 143 mg/dL — ABNORMAL HIGH (ref 65–99)
Potassium: 4.1 mmol/L (ref 3.5–5.1)
SODIUM: 140 mmol/L (ref 135–145)
TOTAL PROTEIN: 7.2 g/dL (ref 6.5–8.1)
Total Bilirubin: 0.2 mg/dL — ABNORMAL LOW (ref 0.3–1.2)

## 2015-07-25 LAB — LIPASE, BLOOD: LIPASE: 23 U/L (ref 22–51)

## 2015-07-25 MED ORDER — LACTATED RINGERS IV SOLN
Freq: Once | INTRAVENOUS | Status: AC
  Administered 2015-07-25: 23:00:00 via INTRAVENOUS

## 2015-07-25 MED ORDER — PANTOPRAZOLE SODIUM 40 MG IV SOLR
40.0000 mg | Freq: Two times a day (BID) | INTRAVENOUS | Status: DC
  Administered 2015-07-25 – 2015-07-28 (×5): 40 mg via INTRAVENOUS
  Filled 2015-07-25 (×6): qty 40

## 2015-07-25 MED ORDER — HEPARIN SODIUM (PORCINE) 5000 UNIT/ML IJ SOLN
5000.0000 [IU] | Freq: Three times a day (TID) | INTRAMUSCULAR | Status: DC
  Administered 2015-07-26 – 2015-07-27 (×6): 5000 [IU] via SUBCUTANEOUS
  Filled 2015-07-25 (×8): qty 1

## 2015-07-25 MED ORDER — KCL IN DEXTROSE-NACL 30-5-0.45 MEQ/L-%-% IV SOLN
INTRAVENOUS | Status: DC
  Administered 2015-07-26 – 2015-07-27 (×4): via INTRAVENOUS
  Filled 2015-07-25 (×9): qty 1000

## 2015-07-25 MED ORDER — ONDANSETRON HCL 4 MG/2ML IJ SOLN
4.0000 mg | Freq: Four times a day (QID) | INTRAMUSCULAR | Status: DC | PRN
Start: 2015-07-25 — End: 2015-07-28

## 2015-07-25 MED ORDER — ONDANSETRON HCL 4 MG PO TABS: 4.0000 mg | ORAL_TABLET | Freq: Four times a day (QID) | ORAL | Status: DC | PRN

## 2015-07-25 MED ORDER — SODIUM CHLORIDE 0.9 % IV BOLUS (SEPSIS)
500.0000 mL | Freq: Once | INTRAVENOUS | Status: AC
  Administered 2015-07-25: 500 mL via INTRAVENOUS

## 2015-07-25 MED ORDER — IOHEXOL 240 MG/ML SOLN
25.0000 mL | Freq: Once | INTRAMUSCULAR | Status: AC | PRN
  Administered 2015-07-25: 25 mL via ORAL

## 2015-07-25 MED ORDER — IOHEXOL 300 MG/ML  SOLN
100.0000 mL | Freq: Once | INTRAMUSCULAR | Status: AC | PRN
  Administered 2015-07-25: 100 mL via INTRAVENOUS

## 2015-07-25 MED ORDER — MORPHINE SULFATE 4 MG/ML IJ SOLN
4.0000 mg | Freq: Once | INTRAMUSCULAR | Status: AC
  Administered 2015-07-25: 4 mg via INTRAVENOUS
  Filled 2015-07-25: qty 1

## 2015-07-25 MED ORDER — MORPHINE SULFATE 2 MG/ML IJ SOLN
2.0000 mg | INTRAMUSCULAR | Status: DC | PRN
  Administered 2015-07-26 – 2015-07-27 (×4): 2 mg via INTRAVENOUS
  Filled 2015-07-25 (×6): qty 1

## 2015-07-25 MED ORDER — ONDANSETRON HCL 4 MG/2ML IJ SOLN
4.0000 mg | Freq: Once | INTRAMUSCULAR | Status: AC
  Administered 2015-07-25: 4 mg via INTRAVENOUS
  Filled 2015-07-25: qty 2

## 2015-07-25 NOTE — ED Notes (Addendum)
Patient stated that the abdominal pain started this morning at 0900 while at work. She worked through it until could not handle it any longer. [place] [facility] brought her to the ED. Patient states the "pain is a 10 of 10 and cannot function" the pain is in the LUQ.

## 2015-07-25 NOTE — Progress Notes (Signed)
   07/25/15 1800  Clinical Encounter Type  Visited With Patient and family together  Visit Type Spiritual support  Spiritual Encounters  Spiritual Needs Prayer  Stress Factors  Patient Stress Factors Health changes  Family Stress Factors Health changes   Status: patient says abdominal pain Faith: Christian Family: Sister, Aunt, Daughter, Boyfriend, present Age/Sex: 68 yrs female Visit Assessment: The chaplain visited with patient and then patient's family. The patient shared that she's in pain as she sat with boyfriend bedside. The family said that they were very upset that the doctor had not been in room yet. The chaplain calmed the family down a bit and politely explained that the ER has high volume tonight.The chaplain gave encouraging words as the family shared their anxieties about their niece or sister who had a heartattack this past weekend and was released and had to return with another heartattack, they said.  Pastoral Care: [phone] pager or submit a request online

## 2015-07-25 NOTE — H&P (Signed)
Regina Short is an 54 y.o. female.    Chief Complaint: Abdominal pain nausea and vomiting  HPI: This patient with abdominal pain nausea and vomiting that started this morning approximately 9:00 it has been gradually worsening she is vomited multiple times. She's never had an episode like this before she denies fevers or chills has not passed gas today but did have a normal bowel movement this morning no melena or hematochezia. She has a history of fibroid tumors removed in the past.  Past Medical History  Diagnosis Date  . Diabetes mellitus without complication   . Hypertension     Past Surgical History  Procedure Laterality Date  . Abdominal hysterectomy    . Abdominal surgery      5 tumors removed    History reviewed. No pertinent family history. Social History:  reports that she has quit smoking. She has quit using smokeless tobacco. She reports that she does not drink alcohol or use illicit drugs.  Allergies: No Known Allergies   (Not in a hospital admission)   Review of Systems  Constitutional: Negative for fever and chills.  HENT: Negative.   Eyes: Negative.   Respiratory: Negative.   Cardiovascular: Negative.   Gastrointestinal: Positive for nausea, vomiting and abdominal pain. Negative for heartburn, diarrhea, constipation, blood in stool and melena.  Genitourinary: Negative.   Musculoskeletal: Negative.   Skin: Negative.   Neurological: Negative.  Negative for weakness.  Endo/Heme/Allergies: Negative.   Psychiatric/Behavioral: Negative.      Physical Exam:  BP 152/92 mmHg  Pulse 61  Temp(Src) 98.3 F (36.8 C) (Oral)  Resp 12  Ht '5\' 6"'  (1.676 m)  Wt 160 lb (72.576 kg)  BMI 25.84 kg/m2  SpO2 99%  Physical Exam  Constitutional: She is oriented to person, place, and time and well-developed, well-nourished, and in no distress. No distress.  Uncomfortable-appearing  HENT:  Head: Normocephalic and atraumatic.  Eyes: No scleral icterus.  Neck: Normal  range of motion. Neck supple.  Cardiovascular: Normal rate and regular rhythm.   Pulmonary/Chest: Effort normal and breath sounds normal. No respiratory distress. She has no wheezes. She has no rales.  Abdominal: Soft. She exhibits distension. There is tenderness. There is guarding. There is no rebound.  Minimal tenderness no rebound no percussion tenderness no peritoneal signs moderately distended and tympanitic  Musculoskeletal: Normal range of motion. She exhibits no edema.  Lymphadenopathy:    She has no cervical adenopathy.  Neurological: She is alert and oriented to person, place, and time.  Skin: Skin is warm and dry. No erythema.  Psychiatric: Mood and affect normal.        Results for orders placed or performed during the hospital encounter of 07/25/15 (from the past 48 hour(s))  CBC with Differential     Status: Abnormal   Collection Time: 07/25/15  5:34 PM  Result Value Ref Range   WBC 6.0 3.6 - 11.0 K/uL   RBC 5.14 3.80 - 5.20 MIL/uL   Hemoglobin 13.0 12.0 - 16.0 g/dL   HCT 39.5 35.0 - 47.0 %   MCV 77.0 (L) 80.0 - 100.0 fL   MCH 25.4 (L) 26.0 - 34.0 pg   MCHC 33.0 32.0 - 36.0 g/dL   RDW 16.5 (H) 11.5 - 14.5 %   Platelets 173 150 - 440 K/uL   Neutrophils Relative % 61 %   Neutro Abs 3.6 1.4 - 6.5 K/uL   Lymphocytes Relative 32 %   Lymphs Abs 1.9 1.0 - 3.6 K/uL  Monocytes Relative 5 %   Monocytes Absolute 0.3 0.2 - 0.9 K/uL   Eosinophils Relative 2 %   Eosinophils Absolute 0.1 0 - 0.7 K/uL   Basophils Relative 0 %   Basophils Absolute 0.0 0 - 0.1 K/uL  Comprehensive metabolic panel     Status: Abnormal   Collection Time: 07/25/15  5:34 PM  Result Value Ref Range   Sodium 140 135 - 145 mmol/L   Potassium 4.1 3.5 - 5.1 mmol/L   Chloride 104 101 - 111 mmol/L   CO2 27 22 - 32 mmol/L   Glucose, Bld 143 (H) 65 - 99 mg/dL   BUN 16 6 - 20 mg/dL   Creatinine, Ser 0.52 0.44 - 1.00 mg/dL   Calcium 9.6 8.9 - 10.3 mg/dL   Total Protein 7.2 6.5 - 8.1 g/dL   Albumin  4.3 3.5 - 5.0 g/dL   AST 20 15 - 41 U/L   ALT 15 14 - 54 U/L   Alkaline Phosphatase 101 38 - 126 U/L   Total Bilirubin 0.2 (L) 0.3 - 1.2 mg/dL   GFR calc non Af Amer >60 >60 mL/min   GFR calc Af Amer >60 >60 mL/min    Comment: (NOTE) The eGFR has been calculated using the CKD EPI equation. This calculation has not been validated in all clinical situations. eGFR's persistently <60 mL/min signify possible Chronic Kidney Disease.    Anion gap 9 5 - 15  Lipase, blood     Status: None   Collection Time: 07/25/15  5:34 PM  Result Value Ref Range   Lipase 23 22 - 51 U/L  Urinalysis complete, with microscopic ([facility] only)     Status: Abnormal   Collection Time: 07/25/15  6:53 PM  Result Value Ref Range   Color, Urine STRAW (A) YELLOW   APPearance CLEAR (A) CLEAR   Glucose, UA NEGATIVE NEGATIVE mg/dL   Bilirubin Urine NEGATIVE NEGATIVE   Ketones, ur TRACE (A) NEGATIVE mg/dL   Specific Gravity, Urine 1.014 1.005 - 1.030   Hgb urine dipstick NEGATIVE NEGATIVE   pH 6.0 5.0 - 8.0   Protein, ur NEGATIVE NEGATIVE mg/dL   Nitrite NEGATIVE NEGATIVE   Leukocytes, UA NEGATIVE NEGATIVE   RBC / HPF 0-5 0 - 5 RBC/hpf   WBC, UA 0-5 0 - 5 WBC/hpf   Bacteria, UA RARE (A) NONE SEEN   Squamous Epithelial / LPF 0-5 (A) NONE SEEN   Dg Abd 1 View  07/25/2015   CLINICAL DATA:  Abdominal pain.  Evaluate perforation.  EXAM: ABDOMEN - 1 VIEW  COMPARISON:  None.  FINDINGS: Upright abdominal radiograph demonstrates no free air underneath the hemidiaphragms. Visible bowel gas pattern is within normal limits for not RIGHT AP image. Pelvis is excluded from view.  IMPRESSION: Negative for free air.  CT forthcoming.   Electronically Signed   By: Dereck Ligas M.D.   On: 07/25/2015 19:18   Ct Abdomen Pelvis W Contrast  07/25/2015   CLINICAL DATA:  Acute onset of left upper quadrant abdominal pain. Initial encounter.  EXAM: CT ABDOMEN AND PELVIS WITH CONTRAST  TECHNIQUE: Multidetector CT imaging of the abdomen and  pelvis was performed using the standard protocol following bolus administration of intravenous contrast.  CONTRAST:  182m OMNIPAQUE IOHEXOL 300 MG/ML  SOLN  COMPARISON:  None.  FINDINGS: The visualized lung bases are clear.  The liver and spleen are unremarkable in appearance. The gallbladder is within normal limits. The pancreas and adrenal glands are unremarkable.  The  kidneys are unremarkable in appearance. There is no evidence of hydronephrosis. No renal or ureteral stones are seen. No perinephric stranding is appreciated.  There is distention of the proximal to mid ileum to 3.6 cm in maximal diameter, with distention to the level of fecalization at the left hemipelvis. There is decompression of the distal ileum. This is compatible with at least partial small bowel obstruction, likely reflecting an underlying adhesion, given the patient's prior surgery. Trace associated free fluid and mild associated soft tissue inflammation are seen within the pelvis.  The stomach is within normal limits. No acute vascular abnormalities are seen. Minimal calcification is noted at the distal abdominal aorta.  The appendix is normal in caliber, without evidence of appendicitis. The colon contains a small to moderate amount of stool and is unremarkable in appearance.  The bladder is relatively decompressed and grossly unremarkable. The patient is status post hysterectomy. The right ovary is unremarkable in appearance. No suspicious adnexal masses are seen. No inguinal lymphadenopathy is seen.  No acute osseous abnormalities are identified.  IMPRESSION: Distention of proximal to mid ileum to 3.6 cm in maximal diameter, with fecalization at the left hemipelvis and decompression of the distal ileum. This is compatible with at least partial small bowel obstruction, likely due to an underlying adhesion, given prior surgery. Relatively early high-grade small bowel obstruction could have a similar appearance. Trace associated free fluid  and mild soft tissue inflammation noted within the pelvis.  These results were called by telephone at the time of interpretation on 07/25/2015 at 7:19 pm to Dr. Max Sane, who verbally acknowledged these results.   Electronically Signed   By: Garald Balding M.D.   On: 07/25/2015 19:21     Assessment/Plan  Radiographs and labs personally reviewed. Suspect adhesive bowel disease causing a partial small bowel obstruction. Nasogastric tube will be placed and the patient will be admitted the hospital for fluid therapy and observation. Reexamination will be necessary as well as repeat films in the morning if the nasogastric tube is not result and decompression and improvement then surgical intervention may be needed this is discussed with she and her family she understood and agreed with this plan  Florene Glen, MD, FACS

## 2015-07-25 NOTE — ED Provider Notes (Signed)
[facility] Emergency Department Provider Note  ____________________________________________  Time seen: Approximately 5:40 PM  I have reviewed the triage vital signs and the nursing notes.   HISTORY  Chief Complaint Abdominal Pain    HPI Regina Short is a 54 y.o. female with history of hypertension, diabetes who presents for evaluation of gradual onset constant diffuse abdominal pain/cramping this morning. Current severity of symptoms is 10 out of 10. She reports her pain has worsened throughout the day. She denies any chest pain, difficulty breathing, nausea, vomiting, diarrhea, fevers or chills. No dysuria or hematuria. No modifying factors. Prior to this morning, she had been in her usual state of health.   Past Medical History  Diagnosis Date  . Diabetes mellitus without complication   . Hypertension     Patient Active Problem List   Diagnosis Date Noted  . Obstruction of intestine 07/25/2015    Past Surgical History  Procedure Laterality Date  . Abdominal hysterectomy    . Abdominal surgery      5 tumors removed    No current outpatient prescriptions on file.  Allergies Review of patient's allergies indicates no known allergies.  History reviewed. No pertinent family history.  Social History History  Substance Use Topics  . Smoking status: Former Smoker    Quit date: 11/20/2014  . Smokeless tobacco: Former Neurosurgeon  . Alcohol Use: 0.0 oz/week    0 Standard drinks or equivalent per week     Comment: occassional drinks, last drink 6 months ago     Review of Systems Constitutional: No fever/chills Eyes: No visual changes. ENT: No sore throat. Cardiovascular: Denies chest pain. Respiratory: Denies shortness of breath. Gastrointestinal: +abdominal pain.  No nausea, no vomiting.  No diarrhea.  No constipation. Genitourinary: Negative for dysuria. Musculoskeletal: Negative for back pain. Skin: Negative for rash. Neurological:  Negative for headaches, focal weakness or numbness.  10-point ROS otherwise negative.  ____________________________________________   PHYSICAL EXAM:  VITAL SIGNS: ED Triage Vitals  Enc Vitals Group     BP 07/25/15 1728 170/92 mmHg     Pulse Rate 07/25/15 1728 65     Resp --      Temp 07/25/15 1728 97.7 F (36.5 C)     Temp Source 07/25/15 1728 Oral     SpO2 07/25/15 1728 100 %     Weight 07/25/15 1728 160 lb (72.576 kg)     Height 07/25/15 1728  (1.676 m)     Head Cir --      Peak Flow --      Pain Score 07/25/15 1730 10     Pain Loc --      Pain Edu? --      Excl. in GC? --     Constitutional: Alert and oriented. In distress secondary to pain. Eyes: Conjunctivae are normal. PERRL. EOMI. Head: Atraumatic. Nose: No congestion/rhinnorhea. Mouth/Throat: Mucous membranes are moist.  Oropharynx non-erythematous. Neck: No stridor.  Cardiovascular: Normal rate, regular rhythm. Grossly normal heart sounds.  Good peripheral circulation. Respiratory: Normal respiratory effort.  No retractions. Lungs CTAB. Gastrointestinal: Moderate diffuse tenderness to palpation with distention. No CVA tenderness Genitourinary: deferred Musculoskeletal: No lower extremity tenderness nor edema.  No joint effusions. Neurologic:  Normal speech and language. No gross focal neurologic deficits are appreciated. No gait instability. Skin:  Skin is warm, dry and intact. No rash noted. Psychiatric: Mood and affect are normal. Speech and behavior are normal.  ____________________________________________   LABS (all labs ordered are  listed, but only abnormal results are displayed)  Labs Reviewed  CBC WITH DIFFERENTIAL/PLATELET - Abnormal; Notable for the following:    MCV 77.0 (*)    MCH 25.4 (*)    RDW 16.5 (*)    All other components within normal limits  COMPREHENSIVE METABOLIC PANEL - Abnormal; Notable for the following:    Glucose, Bld 143 (*)    Total Bilirubin 0.2 (*)    All other  components within normal limits  URINALYSIS COMPLETEWITH MICROSCOPIC ([facility] ONLY) - Abnormal; Notable for the following:    Color, Urine STRAW (*)    APPearance CLEAR (*)    Ketones, ur TRACE (*)    Bacteria, UA RARE (*)    Squamous Epithelial / LPF 0-5 (*)    All other components within normal limits  LIPASE, BLOOD  BASIC METABOLIC PANEL  CBC   ____________________________________________  EKG  ED ECG REPORT I, Gayla Doss, the attending physician, personally viewed and interpreted this ECG.   Date: 07/25/2015  EKG Time: 18:02  Rate: 86  Rhythm: sinus rhythm with occasional PVCs  Axis: normal  Intervals:left bundle branch block (noted on EKG 07/21/15 as well)  ST&T Change: No acute ST segment elevation  ____________________________________________  RADIOLOGY  Upright KUB  FINDINGS: Upright abdominal radiograph demonstrates no free air underneath the hemidiaphragms. Visible bowel gas pattern is within normal limits for not RIGHT AP image. Pelvis is excluded from view.  IMPRESSION: Negative for free air. CT forthcoming.   CT abdomen and pelvis IMPRESSION: Distention of proximal to mid ileum to 3.6 cm in maximal diameter, with fecalization at the left hemipelvis and decompression of the distal ileum. This is compatible with at least partial small bowel obstruction, likely due to an underlying adhesion, given prior surgery. Relatively early high-grade small bowel obstruction could have a similar appearance. Trace associated free fluid and mild soft tissue inflammation noted within the pelvis.  ____________________________________________   PROCEDURES  Procedure(s) performed: None  Critical Care performed: No  ____________________________________________   INITIAL IMPRESSION / ASSESSMENT AND PLAN / ED COURSE  Pertinent labs & imaging results that were available during my care of the patient were reviewed by me and considered in my medical decision  making (see chart for details).  Regina Short is a 55 y.o. female with history of hypertension, diabetes who presents for evaluation of gradual onset constant diffuse abdominal pain/cramping this morning. On exam, she appears to be in distress secondary to pain. She has diffuse abdominal tenderness with some distention. Vital signs are stable however and she is afebrile. Plan for abdominal pain screening labs, upright KUB to rule out perforation or obstruction, likely CT of the abdomen and pelvis. We'll treat her pain.   ----------------------------------------- 7:28 PM on 07/25/2015 -----------------------------------------   CT abdomen and pelvis concerning for small bowel obstruction. Case discussed with Dr. Excell Seltzer of Gen. surgery who will evaluate and admit the patient. ____________________________________________   FINAL CLINICAL IMPRESSION(S) / ED DIAGNOSES  Final diagnoses:  Generalized abdominal pain      Gayla Doss, MD 07/26/15 0021

## 2015-07-26 ENCOUNTER — Inpatient Hospital Stay: Payer: No Typology Code available for payment source

## 2015-07-26 ENCOUNTER — Encounter: Payer: Self-pay | Admitting: Emergency Medicine

## 2015-07-26 LAB — CBC
HEMATOCRIT: 38.7 % (ref 35.0–47.0)
HEMOGLOBIN: 12.6 g/dL (ref 12.0–16.0)
MCH: 25.4 pg — AB (ref 26.0–34.0)
MCHC: 32.5 g/dL (ref 32.0–36.0)
MCV: 77.9 fL — AB (ref 80.0–100.0)
PLATELETS: 183 10*3/uL (ref 150–440)
RBC: 4.96 MIL/uL (ref 3.80–5.20)
RDW: 16.8 % — ABNORMAL HIGH (ref 11.5–14.5)
WBC: 7.5 10*3/uL (ref 3.6–11.0)

## 2015-07-26 LAB — BASIC METABOLIC PANEL
Anion gap: 7 (ref 5–15)
BUN: 10 mg/dL (ref 6–20)
CHLORIDE: 105 mmol/L (ref 101–111)
CO2: 25 mmol/L (ref 22–32)
Calcium: 9.1 mg/dL (ref 8.9–10.3)
Creatinine, Ser: 0.49 mg/dL (ref 0.44–1.00)
Glucose, Bld: 225 mg/dL — ABNORMAL HIGH (ref 65–99)
POTASSIUM: 3.9 mmol/L (ref 3.5–5.1)
Sodium: 137 mmol/L (ref 135–145)

## 2015-07-26 LAB — GLUCOSE, CAPILLARY
GLUCOSE-CAPILLARY: 132 mg/dL — AB (ref 65–99)
GLUCOSE-CAPILLARY: 185 mg/dL — AB (ref 65–99)

## 2015-07-26 MED ORDER — ACETAMINOPHEN 325 MG PO TABS
650.0000 mg | ORAL_TABLET | Freq: Once | ORAL | Status: AC
  Administered 2015-07-26: 650 mg via ORAL
  Filled 2015-07-26: qty 2

## 2015-07-26 MED ORDER — INSULIN ASPART 100 UNIT/ML ~~LOC~~ SOLN
0.0000 [IU] | Freq: Three times a day (TID) | SUBCUTANEOUS | Status: DC
  Administered 2015-07-26: 2 [IU] via SUBCUTANEOUS
  Administered 2015-07-26: 1 [IU] via SUBCUTANEOUS
  Administered 2015-07-27: 3 [IU] via SUBCUTANEOUS
  Administered 2015-07-27 – 2015-07-28 (×3): 2 [IU] via SUBCUTANEOUS
  Filled 2015-07-26: qty 3
  Filled 2015-07-26 (×2): qty 2
  Filled 2015-07-26: qty 3
  Filled 2015-07-26: qty 7
  Filled 2015-07-26: qty 1

## 2015-07-26 MED ORDER — BUTAMBEN-TETRACAINE-BENZOCAINE 2-2-14 % EX AERO
1.0000 | INHALATION_SPRAY | Freq: Once | CUTANEOUS | Status: AC
  Administered 2015-07-26: 1 via TOPICAL
  Filled 2015-07-26: qty 20

## 2015-07-26 NOTE — Consult Note (Signed)
[facility] HOSPITALIST  Medical Consultation  Regina Short ZOX:[id number] DOB: 03/06/1961 DOA: 07/25/2015 PCP: Lyndon Code, MD   Requesting physician: Dr. Dionne Milo Date of consultation: 07/26/15 Reason for consultation: Opinion regarding patient's hypertension, diabetes type 2  CHIEF COMPLAINT:   Chief Complaint  Patient presents with  . Abdominal Pain    LUQ    HISTORY OF PRESENT ILLNESS: Regina Short  Short a 54 y.o. female with a known history of  hypertension, diabetes type 2, GERD and chronic back pain who Short admitted to the surgical service after small bowel obstruction diagnosis. Patient started having severe abdominal pain nausea and throwing up since this morning. She was seen in the emergency room and was noted to have small bowel obstruction and has been admitted by surgery. She has had the NG tube in place. She currently complains of sharp abdominal pain and nausea. Reports last bowel movement was on Wednesday.  PAST MEDICAL HISTORY:   Past Medical History  Diagnosis Date  . Diabetes mellitus without complication   . Hypertension   . GERD (gastroesophageal reflux disease)   . Chronic back pain     PAST SURGICAL HISTORY:  Past Surgical History  Procedure Laterality Date  . Abdominal hysterectomy    . Abdominal surgery      5 tumors removed    SOCIAL HISTORY:  History  Substance Use Topics  . Smoking status: Former Smoker    Quit date: 11/20/2014  . Smokeless tobacco: Former Neurosurgeon  . Alcohol Use: No     Comment: occassional drinks, last drink 6 months ago     FAMILY HISTORY:  Family History  Problem Relation Age of Onset  . Congestive Heart Failure      DRUG ALLERGIES: No Known Allergies  REVIEW OF SYSTEMS:   CONSTITUTIONAL: No fever, fatigue or weakness.  EYES: No blurred or double vision.  EARS, NOSE, AND THROAT: No tinnitus or ear pain.  RESPIRATORY: No cough, shortness of breath, wheezing or hemoptysis.  CARDIOVASCULAR: No chest pain, orthopnea,  edema.  GASTROINTESTINAL: Positive nausea, vomiting, diarrhea or positive abdominal pain.  GENITOURINARY: No dysuria, hematuria.  ENDOCRINE: No polyuria, nocturia,  HEMATOLOGY: No anemia, easy bruising or bleeding SKIN: No rash or lesion. MUSCULOSKELETAL: Chronic back pain   NEUROLOGIC: No tingling, numbness, weakness.  PSYCHIATRY: No anxiety or depression.   MEDICATIONS AT HOME:  Prior to Admission medications   Medication Sig Start Date End Date Taking? Authorizing Provider  cyclobenzaprine (FLEXERIL) 10 MG tablet Take 1 tablet by mouth daily. 07/20/15  Yes Historical Provider, MD  glyBURIDE-metformin (GLUCOVANCE) 5-500 MG per tablet Take 1 tablet by mouth 2 (two) times daily. 05/31/15  Yes Historical Provider, MD  lisinopril (PRINIVIL,ZESTRIL) 10 MG tablet Take 1 tablet by mouth daily. 07/04/15  Yes Historical Provider, MD  ONGLYZA 5 MG TABS tablet Take 1 tablet by mouth daily. 07/20/15  Yes Historical Provider, MD  ranitidine (ZANTAC) 150 MG capsule Take 1 capsule (150 mg total) by mouth 2 (two) times daily. 07/21/15  Yes Sharman Cheek, MD  traMADol (ULTRAM) 50 MG tablet Take 1 tablet by mouth 2 (two) times daily. 07/20/15   Historical Provider, MD      PHYSICAL EXAMINATION:   VITAL SIGNS: Blood pressure 144/79, pulse 56, temperature 97.5 F (36.4 C), temperature source Oral, resp. rate 16, height 5\' 6"  (1.676 m), weight 77.429 kg (170 lb 11.2 oz), SpO2 100 %.  GENERAL:  54 y.o.-year-old patient lying in the bed with no acute distress.  EYES:  Pupils equal, round, reactive to light and accommodation. No scleral icterus. Extraocular muscles intact.  HEENT: Head atraumatic, normocephalic. Oropharynx and nasopharynx clear.  NECK:  Supple, no jugular venous distention. No thyroid enlargement, no tenderness.  LUNGS: Normal breath sounds bilaterally, no wheezing, rales,rhonchi or crepitation. No use of accessory muscles of respiration.  CARDIOVASCULAR: S1, S2 normal. No murmurs, rubs, or  gallops.  ABDOMEN: Distended, diffuse tenderness no guarding or rebound, diminished bowel sounds EXTREMITIES: No pedal edema, cyanosis, or clubbing.  NEUROLOGIC: Cranial nerves II through XII are intact. Muscle strength 5/5 in all extremities. Sensation intact. Gait not checked.  PSYCHIATRIC: The patient Short alert and oriented x 3.  SKIN: No obvious rash, lesion, or ulcer.   LABORATORY PANEL:   CBC  Recent Labs Lab 07/21/15 0213 07/25/15 1734 07/26/15 0312  WBC 6.5 6.0 7.5  HGB 12.3 13.0 12.6  HCT 37.4 39.5 38.7  PLT 185 173 183  MCV 77.4* 77.0* 77.9*  MCH 25.6* 25.4* 25.4*  MCHC 33.0 33.0 32.5  RDW 16.7* 16.5* 16.8*  LYMPHSABS  --  1.9  --   MONOABS  --  0.3  --   EOSABS  --  0.1  --   BASOSABS  --  0.0  --    ------------------------------------------------------------------------------------------------------------------  Chemistries   Recent Labs Lab 07/21/15 0213 07/25/15 1734 07/26/15 0312  NA 135 140 137  K 3.7 4.1 3.9  CL 101 104 105  CO2 26 27 25   GLUCOSE 195* 143* 225*  BUN 16 16 10   CREATININE 0.57 0.52 0.49  CALCIUM 9.1 9.6 9.1  AST  --  20  --   ALT  --  15  --   ALKPHOS  --  101  --   BILITOT  --  0.2*  --    ------------------------------------------------------------------------------------------------------------------ estimated creatinine clearance Short 84.4 mL/min (by C-G formula based on Cr of 0.49). ------------------------------------------------------------------------------------------------------------------ No results for input(s): TSH, T4TOTAL, T3FREE, THYROIDAB in the last 72 hours.  Invalid input(s): FREET3   Coagulation profile No results for input(s): INR, PROTIME in the last 168 hours. ------------------------------------------------------------------------------------------------------------------- No results for input(s): DDIMER in the last 72  hours. -------------------------------------------------------------------------------------------------------------------  Cardiac Enzymes No results for input(s): CKMB, TROPONINI, MYOGLOBIN in the last 168 hours.  Invalid input(s): CK ------------------------------------------------------------------------------------------------------------------ Invalid input(s): POCBNP  ---------------------------------------------------------------------------------------------------------------  Urinalysis    Component Value Date/Time   COLORURINE STRAW* 07/25/2015 1853   COLORURINE Straw 01/03/2013 1608   APPEARANCEUR CLEAR* 07/25/2015 1853   APPEARANCEUR Clear 01/03/2013 1608   LABSPEC 1.014 07/25/2015 1853   LABSPEC 1.028 01/03/2013 1608   PHURINE 6.0 07/25/2015 1853   PHURINE 5.0 01/03/2013 1608   GLUCOSEU NEGATIVE 07/25/2015 1853   GLUCOSEU >=500 01/03/2013 1608   HGBUR NEGATIVE 07/25/2015 1853   HGBUR Negative 01/03/2013 1608   BILIRUBINUR NEGATIVE 07/25/2015 1853   BILIRUBINUR Negative 01/03/2013 1608   KETONESUR TRACE* 07/25/2015 1853   KETONESUR Trace 01/03/2013 1608   PROTEINUR NEGATIVE 07/25/2015 1853   PROTEINUR Negative 01/03/2013 1608   NITRITE NEGATIVE 07/25/2015 1853   NITRITE Negative 01/03/2013 1608   LEUKOCYTESUR NEGATIVE 07/25/2015 1853   LEUKOCYTESUR Negative 01/03/2013 1608     RADIOLOGY: Dg Abd 1 View  07/26/2015   CLINICAL DATA:  Verify tube placement  EXAM: ABDOMEN - 1 VIEW  COMPARISON:  CT 07/25/2015  FINDINGS: The nasogastric tube extends into the stomach with tip in the region of the proximal gastric body. Abdominal gas pattern Short unremarkable.  IMPRESSION: Nasogastric tube extends into the stomach.   Electronically  Signed   By: Ellery Plunk M.D.   On: 07/26/2015 00:21   Dg Abd 1 View  07/25/2015   CLINICAL DATA:  Abdominal pain.  Evaluate perforation.  EXAM: ABDOMEN - 1 VIEW  COMPARISON:  None.  FINDINGS: Upright abdominal radiograph demonstrates no  free air underneath the hemidiaphragms. Visible bowel gas pattern Short within normal limits for not RIGHT AP image. Pelvis Short excluded from view.  IMPRESSION: Negative for free air.  CT forthcoming.   Electronically Signed   By: Andreas Newport M.D.   On: 07/25/2015 19:18   Ct Abdomen Pelvis W Contrast  07/25/2015   CLINICAL DATA:  Acute onset of left upper quadrant abdominal pain. Initial encounter.  EXAM: CT ABDOMEN AND PELVIS WITH CONTRAST  TECHNIQUE: Multidetector CT imaging of the abdomen and pelvis was performed using the standard protocol following bolus administration of intravenous contrast.  CONTRAST:  OMNIPAQUE IOHEXOL 300 MG/ML  SOLN  COMPARISON:  None.  FINDINGS: The visualized lung bases are clear.  The liver and spleen are unremarkable in appearance. The gallbladder Short within normal limits. The pancreas and adrenal glands are unremarkable.  The kidneys are unremarkable in appearance. There Short no evidence of hydronephrosis. No renal or ureteral stones are seen. No perinephric stranding Short appreciated.  There Short distention of the proximal to mid ileum to 3.6 cm in maximal diameter, with distention to the level of fecalization at the left hemipelvis. There Short decompression of the distal ileum. This Short compatible with at least partial small bowel obstruction, likely reflecting an underlying adhesion, given the patient's prior surgery. Trace associated free fluid and mild associated soft tissue inflammation are seen within the pelvis.  The stomach Short within normal limits. No acute vascular abnormalities are seen. Minimal calcification Short noted at the distal abdominal aorta.  The appendix Short normal in caliber, without evidence of appendicitis. The colon contains a small to moderate amount of stool and Short unremarkable in appearance.  The bladder Short relatively decompressed and grossly unremarkable. The patient Short status post hysterectomy. The right ovary Short unremarkable in appearance. No suspicious  adnexal masses are seen. No inguinal lymphadenopathy Short seen.  No acute osseous abnormalities are identified.  IMPRESSION: Distention of proximal to mid ileum to 3.6 cm in maximal diameter, with fecalization at the left hemipelvis and decompression of the distal ileum. This Short compatible with at least partial small bowel obstruction, likely due to an underlying adhesion, given prior surgery. Relatively early high-grade small bowel obstruction could have a similar appearance. Trace associated free fluid and mild soft tissue inflammation noted within the pelvis.  These results were called by telephone at the time of interpretation on 07/25/2015 at 7:19 pm to Dr. Helene Kelp, who verbally acknowledged these results.   Electronically Signed   By: Roanna Raider M.D.   On: 07/25/2015 19:21   Acute Abdominal Series  07/26/2015   CLINICAL DATA:  Small bowel obstruction, diffuse abdominal pain  EXAM: DG ABDOMEN ACUTE W/ 1V CHEST  COMPARISON:  07/25/2015  FINDINGS: Borderline cardiomegaly. No acute infiltrate or pulmonary edema. There are distended small bowel loops in left abdomen with some air-fluid levels highly suspicious for at least partial small bowel obstruction. Moderate stool noted in right colon and proximal transverse colon. No free abdominal air.  IMPRESSION: No acute disease within chest. Distended small bowel loops with some air-fluid levels in left abdomen highly suspicious for at least partial small bowel obstruction. No free abdominal air.   Electronically Signed  By: Natasha Mead M.D.   On: 07/26/2015 09:50    EKG: Orders placed or performed during the hospital encounter of 07/25/15  . ED EKG  . ED EKG  . EKG 12-Lead  . EKG 12-Lead    IMPRESSION AND PLAN: Patient Short a 54 year old African-American female with history of hypertension diabetes with small bowel obstruction  1. Diabetes type 2: Continue to hold glyburide and metformin for time being as well as Onglyza, I will start patient on  sliding scale insulin and do Accu-Cheks every 6 hours since she Short nothing by mouth.  2. Hypertension continue to hold lisinopril, due to nothing by mouth status start hydralazine when necessary  3. GERD continue Protonix   4. Miscellaneous SCDs for DVT prophylaxis    All the records are reviewed and case discussed with ED provider. Management plans discussed with the patient, family and they are in agreement.  CODE STATUS:    Code Status Orders        Start     Ordered   07/25/15 2109  Full code   Continuous     07/25/15 2110       TOTAL TIME TAKING CARE OF THIS PATIENT: 55 minutes.    Auburn Bilberry M.D on 07/26/2015 at 11:06 AM  Between 7am to 6pm - Pager - [phone]  After 6pm go to [email or web address] - password [email or web address] [facility]  [place] [facility]  Office  [phone]  CC: Primary care physician; Lyndon Code, MD

## 2015-07-26 NOTE — Progress Notes (Signed)
Patient A/O, no noted distress. C/o minimal pain. Administered prn pain regimen, effective. NG left nare, LWS. No noted skin issues. Staff will continue to monitor and meet needs.

## 2015-07-26 NOTE — Progress Notes (Signed)
Pt states NG tube fell out at 0730. Dr. Allena Katz notified. New orders to re-insert NG tube.

## 2015-07-26 NOTE — Progress Notes (Signed)
Pt very anxious and thrashing during NG tube insertion. C/o pain during procedure and instructed staff to stop with attempted administration. MD called and made aware. Will order cetacaine spray and re attempt shortly. Pt in agreement.

## 2015-07-26 NOTE — Progress Notes (Signed)
Surgery progress note.  NG tube fell out this morning. IV was lost. IV has been restarted. Attempts will be made for reinsertion of nasogastric tube.  The patient states that she's had flatus since this morning.  Filed Vitals:   07/25/15 2200 07/25/15 2230 07/26/15 0017 07/26/15 0743  BP: 149/94 150/85 182/74 144/79  Pulse:  62 56 56  Temp:  98.2 F (36.8 C) 97.5 F (36.4 C) 97.5 F (36.4 C)  TempSrc:  Oral Oral Oral  Resp: Height:      Weight:   170 lb 11.2 oz (77.429 kg)   SpO2:  97% 96% 100%   Physical examination demonstrates a distended and mildly tender abdomen in the left lower quadrant.  Review of plain films demonstrate continued small bowel dilatation with air-fluid levels.  Impression adhesive small bowel instruction.  Plan reinsert nasogastric tube decompressed stomach. Serial examinations. If she does not improve she will require laparotomy.   Discussed with RN on staff.

## 2015-07-27 ENCOUNTER — Inpatient Hospital Stay: Payer: No Typology Code available for payment source

## 2015-07-27 DIAGNOSIS — K5669 Other intestinal obstruction: Secondary | ICD-10-CM

## 2015-07-27 DIAGNOSIS — R1084 Generalized abdominal pain: Secondary | ICD-10-CM

## 2015-07-27 DIAGNOSIS — K56609 Unspecified intestinal obstruction, unspecified as to partial versus complete obstruction: Secondary | ICD-10-CM | POA: Insufficient documentation

## 2015-07-27 LAB — BASIC METABOLIC PANEL
Anion gap: 5 (ref 5–15)
BUN: 6 mg/dL (ref 6–20)
CO2: 30 mmol/L (ref 22–32)
Calcium: 9.4 mg/dL (ref 8.9–10.3)
Chloride: 102 mmol/L (ref 101–111)
Creatinine, Ser: 0.58 mg/dL (ref 0.44–1.00)
GFR calc Af Amer: >60 mL/min (ref >60)
GFR calc non Af Amer: >60 mL/min (ref >60)
Glucose, Bld: 211 mg/dL — ABNORMAL HIGH (ref 65–99)
Potassium: 5.2 mmol/L — ABNORMAL HIGH (ref 3.5–5.1)
SODIUM: 137 mmol/L (ref 135–145)

## 2015-07-27 LAB — GLUCOSE, CAPILLARY
GLUCOSE-CAPILLARY: 123 mg/dL — AB (ref 65–99)
GLUCOSE-CAPILLARY: 214 mg/dL — AB (ref 65–99)
GLUCOSE-CAPILLARY: 225 mg/dL — AB (ref 65–99)
Glucose-Capillary: 229 mg/dL — ABNORMAL HIGH (ref 65–99)
Glucose-Capillary: 164 mg/dL — ABNORMAL HIGH (ref 65–99)
Glucose-Capillary: 182 mg/dL — ABNORMAL HIGH (ref 65–99)

## 2015-07-27 LAB — CBC
HEMATOCRIT: 38.9 % (ref 35.0–47.0)
HEMOGLOBIN: 12.7 g/dL (ref 12.0–16.0)
MCH: 25.2 pg — AB (ref 26.0–34.0)
MCHC: 32.6 g/dL (ref 32.0–36.0)
MCV: 77.4 fL — ABNORMAL LOW (ref 80.0–100.0)
PLATELETS: 180 10*3/uL (ref 150–440)
RBC: 5.03 MIL/uL (ref 3.80–5.20)
RDW: 16.8 % — ABNORMAL HIGH (ref 11.5–14.5)
WBC: 4.1 10*3/uL (ref 3.6–11.0)

## 2015-07-27 MED ORDER — SODIUM CHLORIDE 0.9 % IV SOLN
INTRAVENOUS | Status: DC
  Administered 2015-07-27 – 2015-07-28 (×3): via INTRAVENOUS

## 2015-07-27 MED ORDER — ACETAMINOPHEN 325 MG PO TABS
650.0000 mg | ORAL_TABLET | Freq: Four times a day (QID) | ORAL | Status: DC | PRN
  Administered 2015-07-27: 650 mg via ORAL
  Filled 2015-07-27: qty 2

## 2015-07-27 NOTE — Progress Notes (Signed)
Patient ID: Regina Short, female   DOB: Dec 09, 1961, 54 y.o.   MRN: [id number]   [facility]   PATIENT NAME: Regina Short    MR#:  [id number]  DATE OF BIRTH:  08-Jan-1961  SUBJECTIVE:   She is passing some flatus but still with significant abdominal pain  No BM.  REVIEW OF SYSTEMS:   Review of Systems  Constitutional: Negative for fever, chills and weight loss.  Gastrointestinal: Positive for nausea and abdominal pain. Negative for diarrhea.  Genitourinary: Negative.   All other systems reviewed and are negative.   DRUG ALLERGIES:  No Known Allergies  VITALS:  Blood pressure 144/70, pulse 50, temperature 97.8 F (36.6 C), temperature source Oral, resp. rate 16, height  (1.676 m), weight 77.429 kg (170 lb 11.2 oz), SpO2 96 %.  PHYSICAL EXAMINATION:  GENERAL:  54 y.o.-year-old patient lying in the bed with no acute distress.  EYES: Pupils equal, round, reactive to light and accommodation. No scleral icterus. Extraocular muscles intact.  HEENT: Head atraumatic, normocephalic. Oropharynx and nasopharynx clear.  NECK:  Supple, no jugular venous distention. No thyroid enlargement, no tenderness.  LUNGS: Normal breath sounds bilaterally, no wheezing, rales,rhonchi or crepitation. No use of accessory muscles of respiration.  CARDIOVASCULAR: S1, S2 normal. No murmurs, rubs, or gallops.  ABDOMEN: distended, tender throughout. Could not check for tympany. EXTREMITIES: No pedal edema, cyanosis, or clubbing.  NEUROLOGIC: Cranial nerves II through XII are intact. Muscle strength 5/5 in all extremities. Sensation intact. Gait not checked.  PSYCHIATRIC: The patient is alert and oriented x 3.  SKIN: No obvious rash, lesion, or ulcer.   I/O last 3 completed shifts: In: 2955.1 [P.O.:30; I.V.:2805.1; Other:40; NG/GT:80] Out: 2350 [Urine:2000; Emesis/NG output:350] Total I/O In: -  Out: 600 [Urine:600]   CBC Latest Ref Rng 07/26/2015 07/25/2015 07/21/2015  WBC 3.6 - 11.0 K/uL  7.5 6.0 6.5  Hemoglobin 12.0 - 16.0 g/dL 78.2 95.6 21.3  Hematocrit 35.0 - 47.0 % 38.7 39.5 37.4  Platelets 150 - 440 K/uL 183 173 185      ASSESSMENT AND PLAN:   Review of films from yesterday demonstrates some small bowel dilation. She is passing gas although her amount of pain is very concerning for ongoing obstruction.   We will check a CBC And to a Films of the Abdomen Today and Make a Decision regarding Laparotomy Needed. I Discussed with Her and Her Family and They Are in Agreement.

## 2015-07-27 NOTE — Progress Notes (Signed)
Inpatient Diabetes Program Recommendations  AACE/ADA: New Consensus Statement on Inpatient Glycemic Control (2013)  Target Ranges:  Prepandial:   less than 140 mg/dL      Peak postprandial:   less than 180 mg/dL (1-2 hours)      Critically ill patients:  140 - 180 mg/dL   Reason for assessment: elevated CBG  Diabetes history: Type 2 Outpatient Diabetes medications: Glucovance 5-500mg  bid, Onglyza  qday Current orders for Inpatient glycemic control: Novolog insulin 0-9 units tid   Please consider increasing insulin; Novolog correction 0-9 units to q4h since the patient is NPO.   Susette Racer, RN, BA, MHA, CDE Diabetes Coordinator Inpatient Diabetes Program  [phone] (Team Pager) [phone] [facility] Office) 07/27/2015 11:53 AM

## 2015-07-27 NOTE — Progress Notes (Signed)
Pt. CBG at 00:00 214. MD notified

## 2015-07-27 NOTE — Progress Notes (Signed)
Patient ID: Regina Short, female   DOB: Mar 30, 1961, 54 y.o.   MRN: [id number]   [facility]   PATIENT NAME: Regina Short    MR#:  [id number]  DATE OF BIRTH:  19-Nov-1961  SUBJECTIVE:   She is passing flatus. Still with some abdominal pain.  REVIEW OF SYSTEMS:   Review of Systems  Constitutional: Negative for fever and chills.  Respiratory: Negative.   Cardiovascular: Negative.   Gastrointestinal: Positive for abdominal pain. Negative for nausea and vomiting.  Genitourinary: Negative for dysuria.  Musculoskeletal: Negative.   All other systems reviewed and are negative.   DRUG ALLERGIES:  No Known Allergies  VITALS:  Blood pressure 144/70, pulse 50, temperature 97.8 F (36.6 C), temperature source Oral, resp. rate 16, height  (1.676 m), weight 77.429 kg (170 lb 11.2 oz), SpO2 96 %.  PHYSICAL EXAMINATION:  GENERAL:  54 y.o.-year-old patient lying in the bed with no acute distress.  EYES: Pupils equal, round, reactive to light and accommodation. No scleral icterus. Extraocular muscles intact.  HEENT: Head atraumatic, normocephalic. Oropharynx and nasopharynx clear.  NECK:  Supple, no jugular venous distention. No thyroid enlargement, no tenderness.  LUNGS: Normal breath sounds bilaterally, no wheezing, rales,rhonchi or crepitation. No use of accessory muscles of respiration.  CARDIOVASCULAR: S1, S2 normal. No murmurs, rubs, or gallops.  ABDOMEN: mildly tender abdomen. EXTREMITIES: No pedal edema, cyanosis, or clubbing.  NEUROLOGIC: Cranial nerves II through XII are intact. Muscle strength 5/5 in all extremities. Sensation intact. Gait not checked.  PSYCHIATRIC: The patient is alert and oriented x 3.  SKIN: No obvious rash, lesion, or ulcer.   Films show non-distended bowels, no air-fluid levels.  CBC Latest Ref Rng 07/27/2015 07/26/2015 07/25/2015  WBC 3.6 - 11.0 K/uL 4.1 7.5 6.0  Hemoglobin 12.0 - 16.0 g/dL 08.6 57.8 46.9  Hematocrit 35.0 - 47.0 % 38.9 38.7 39.5   Platelets 150 - 440 K/uL 180 183 173     ASSESSMENT AND PLAN:   PSBo adhesive resolved by films.  Unclear why abdomen is tender.    Will d/ c NGT allow clears  Follow-up exam later this afternoon.

## 2015-07-27 NOTE — Progress Notes (Signed)
[facility] [place] [facility]                                                                                                                                                                                            Patient Demographics   Regina Short, is a 54 y.o. female, DOB - [birth year], ZOX:[id number]  Admit date - 07/25/2015   Admitting Physician Lattie Haw, MD  Outpatient Primary MD for the patient is Lyndon Code, MD   LOS - 2  Subjective: Continues to have abdominal pain. NG tube continues to have drainage     Review of Systems:   CONSTITUTIONAL: No documented fever. No fatigue, weakness. No weight gain, no weight loss.  EYES: No blurry or double vision.  ENT: No tinnitus. No postnasal drip. No redness of the oropharynx.  RESPIRATORY: No cough, no wheeze, no hemoptysis. No dyspnea.  CARDIOVASCULAR: No chest pain. No orthopnea. No palpitations. No syncope.  GASTROINTESTINAL: Positive nausea, no vomiting or diarrhea. Positive abdominal pain. No melena or hematochezia.  GENITOURINARY: No dysuria or hematuria.  ENDOCRINE: No polyuria or nocturia. No heat or cold intolerance.  HEMATOLOGY: No anemia. No bruising. No bleeding.  INTEGUMENTARY: No rashes. No lesions.  MUSCULOSKELETAL: No arthritis. No swelling. No gout.  NEUROLOGIC: No numbness, tingling, or ataxia. No seizure-type activity.  PSYCHIATRIC: No anxiety. No insomnia. No ADD.    Vitals:   Filed Vitals:   07/26/15 1616 07/26/15 2000 07/26/15 2309 07/27/15 0746  BP: 158/80 181/95 153/83 144/70  Pulse:  50 49 50  Temp:   98.2 F (36.8 C) 97.8 F (36.6 C)  TempSrc:   Oral Oral  Resp:   20 16  Height:      Weight:      SpO2:   98% 96%    Wt Readings from Last 3 Encounters:  07/26/15 77.429 kg (170 lb 11.2 oz)  07/21/15 72.576 kg (160 lb)     Intake/Output Summary (Last 24 hours) at 07/27/15 1142 Last data filed at 07/27/15 1109  Gross per 24 hour  Intake 3127.08 ml   Output   2925 ml  Net 202.08 ml    Physical Exam:   GENERAL: Pleasant-appearing in no apparent distress.  HEAD, EYES, EARS, NOSE AND THROAT: Atraumatic, normocephalic. Extraocular muscles are intact. Pupils equal and reactive to light. Sclerae anicteric. No conjunctival injection. No oro-pharyngeal erythema.  NECK: Supple. There is no jugular venous distention. No bruits, no lymphadenopathy, no thyromegaly.  HEART: Regular rate and rhythm,. No murmurs, no rubs, no clicks.  LUNGS: Clear to auscultation bilaterally. No rales or rhonchi. No wheezes.  ABDOMEN: Soft, mildly distended tender to touch diminished bowel sounds  EXTREMITIES: No evidence of any cyanosis, clubbing, or peripheral edema.  +2 pedal and radial pulses bilaterally.  NEUROLOGIC: The patient is alert, awake, and oriented x3 with no focal motor or sensory deficits appreciated bilaterally.  SKIN: Moist and warm with no rashes appreciated.  Psych: Not anxious, depressed LN: No inguinal LN enlargement    Antibiotics   Anti-infectives    None      Medications   Scheduled Meds: . heparin  5,000 Units Subcutaneous 3 times per day  . insulin aspart  0-9 Units Subcutaneous TID WC  . pantoprazole (PROTONIX) IV  40 mg Intravenous Q12H   Continuous Infusions: . sodium chloride     PRN Meds:.morphine injection, ondansetron **OR** ondansetron (ZOFRAN) IV   Data Review:   Micro Results No results found for this or any previous visit (from the past 240 hour(s)).  Radiology Reports Dg Abd 1 View  07/26/2015   CLINICAL DATA:  Verify tube placement  EXAM: ABDOMEN - 1 VIEW  COMPARISON:  CT 07/25/2015  FINDINGS: The nasogastric tube extends into the stomach with tip in the region of the proximal gastric body. Abdominal gas pattern is unremarkable.  IMPRESSION: Nasogastric tube extends into the stomach.   Electronically Signed   By: Ellery Plunk M.D.   On: 07/26/2015 00:21   Dg Abd 1 View  07/25/2015   CLINICAL DATA:   Abdominal pain.  Evaluate perforation.  EXAM: ABDOMEN - 1 VIEW  COMPARISON:  None.  FINDINGS: Upright abdominal radiograph demonstrates no free air underneath the hemidiaphragms. Visible bowel gas pattern is within normal limits for not RIGHT AP image. Pelvis is excluded from view.  IMPRESSION: Negative for free air.  CT forthcoming.   Electronically Signed   By: Andreas Newport M.D.   On: 07/25/2015 19:18   Ct Abdomen Pelvis W Contrast  07/25/2015   CLINICAL DATA:  Acute onset of left upper quadrant abdominal pain. Initial encounter.  EXAM: CT ABDOMEN AND PELVIS WITH CONTRAST  TECHNIQUE: Multidetector CT imaging of the abdomen and pelvis was performed using the standard protocol following bolus administration of intravenous contrast.  CONTRAST:  OMNIPAQUE IOHEXOL 300 MG/ML  SOLN  COMPARISON:  None.  FINDINGS: The visualized lung bases are clear.  The liver and spleen are unremarkable in appearance. The gallbladder is within normal limits. The pancreas and adrenal glands are unremarkable.  The kidneys are unremarkable in appearance. There is no evidence of hydronephrosis. No renal or ureteral stones are seen. No perinephric stranding is appreciated.  There is distention of the proximal to mid ileum to 3.6 cm in maximal diameter, with distention to the level of fecalization at the left hemipelvis. There is decompression of the distal ileum. This is compatible with at least partial small bowel obstruction, likely reflecting an underlying adhesion, given the patient's prior surgery. Trace associated free fluid and mild associated soft tissue inflammation are seen within the pelvis.  The stomach is within normal limits. No acute vascular abnormalities are seen. Minimal calcification is noted at the distal abdominal aorta.  The appendix is normal in caliber, without evidence of appendicitis. The colon contains a small to moderate amount of stool and is unremarkable in appearance.  The bladder is relatively  decompressed and grossly unremarkable. The patient is status post hysterectomy. The right ovary is unremarkable in appearance. No suspicious adnexal masses are seen. No inguinal lymphadenopathy is seen.  No acute osseous abnormalities are identified.  IMPRESSION: Distention of proximal to mid ileum to 3.6 cm in maximal diameter, with fecalization at the left hemipelvis and decompression of the distal ileum. This is compatible with at least partial small bowel obstruction, likely due to an underlying adhesion, given prior surgery. Relatively early high-grade small bowel obstruction could have a similar appearance. Trace associated free fluid and mild soft tissue inflammation noted within the pelvis.  These results were called by telephone at the time of interpretation on 07/25/2015 at 7:19 pm to Dr. Helene Kelp, who verbally acknowledged these results.   Electronically Signed   By: Roanna Raider M.D.   On: 07/25/2015 19:21   Dg Knee Complete 4 Views Right  07/13/2015   CLINICAL DATA:  Pain and swelling in the right knee for 1 month, no known injury, initial encounter  EXAM: RIGHT KNEE - COMPLETE 4+ VIEW  COMPARISON:  None.  FINDINGS: Mild degenerative changes are noted with medial osteophytic change. No significant joint effusion is noted. No acute fracture or dislocation is noted.  IMPRESSION: Degenerative change without acute abnormality.   Electronically Signed   By: Alcide Clever M.D.   On: 07/13/2015 16:15   Dg Abd 2 Views  07/27/2015   CLINICAL DATA:  New nasogastric tube. Recent small bowel obstruction  EXAM: ABDOMEN - 2 VIEW  COMPARISON:  July 26, 2015  FINDINGS: Supine and upright images obtained. Nasogastric tube tip and side port in stomach. There is no longer bowel dilatation. No air-fluid levels. Contrast is seen in the colon. No free air. No abnormal calcifications. There is mild atelectasis in the right lung base. Lung bases are otherwise clear.  IMPRESSION: Bowel gas pattern unremarkable.  Contrast in colon. Nasogastric tube tip and side port in stomach. Right base atelectasis. Lung bases otherwise clear.   Electronically Signed   By: Bretta Bang III M.D.   On: 07/27/2015 10:21   Acute Abdominal Series  07/26/2015   CLINICAL DATA:  Small bowel obstruction, diffuse abdominal pain  EXAM: DG ABDOMEN ACUTE W/ 1V CHEST  COMPARISON:  07/25/2015  FINDINGS: Borderline cardiomegaly. No acute infiltrate or pulmonary edema. There are distended small bowel loops in left abdomen with some air-fluid levels highly suspicious for at least partial small bowel obstruction. Moderate stool noted in right colon and proximal transverse colon. No free abdominal air.  IMPRESSION: No acute disease within chest. Distended small bowel loops with some air-fluid levels in left abdomen highly suspicious for at least partial small bowel obstruction. No free abdominal air.   Electronically Signed   By: Natasha Mead M.D.   On: 07/26/2015 09:50     CBC  Recent Labs Lab 07/21/15 0213 07/25/15 1734 07/26/15 0312 07/27/15 0955  WBC 6.5 6.0 7.5 4.1  HGB 12.3 13.0 12.6 12.7  HCT 37.4 39.5 38.7 38.9  PLT 185 173 183 180  MCV 77.4* 77.0* 77.9* 77.4*  MCH 25.6* 25.4* 25.4* 25.2*  MCHC 33.0 33.0 32.5 32.6  RDW 16.7* 16.5* 16.8* 16.8*  LYMPHSABS  --  1.9  --   --   MONOABS  --  0.3  --   --   EOSABS  --  0.1  --   --   BASOSABS  --  0.0  --   --     Chemistries   Recent Labs Lab 07/21/15 0213 07/25/15 1734 07/26/15 0312 07/27/15 0955  NA 135 140 137 137  K 3.7 4.1 3.9 5.2*  CL 101 104 105 102  CO2 26 27 25  30  GLUCOSE 195* 143* 225* 211*  BUN CREATININE 0.57 0.52 0.49 0.58  CALCIUM 9.1 9.6 9.1 9.4  AST  --  20  --   --   ALT  --  15  --   --   ALKPHOS  --  101  --   --   BILITOT  --  0.2*  --   --    ------------------------------------------------------------------------------------------------------------------ estimated creatinine clearance is 84.4 mL/min (by C-G formula based  on Cr of 0.58). ------------------------------------------------------------------------------------------------------------------ No results for input(s): HGBA1C in the last 72 hours. ------------------------------------------------------------------------------------------------------------------ No results for input(s): CHOL, HDL, LDLCALC, TRIG, CHOLHDL, LDLDIRECT in the last 72 hours. ------------------------------------------------------------------------------------------------------------------ No results for input(s): TSH, T4TOTAL, T3FREE, THYROIDAB in the last 72 hours.  Invalid input(s): FREET3 ------------------------------------------------------------------------------------------------------------------ No results for input(s): VITAMINB12, FOLATE, FERRITIN, TIBC, IRON, RETICCTPCT in the last 72 hours.  Coagulation profile No results for input(s): INR, PROTIME in the last 168 hours.  No results for input(s): DDIMER in the last 72 hours.  Cardiac Enzymes No results for input(s): CKMB, TROPONINI, MYOGLOBIN in the last 168 hours.  Invalid input(s): CK ------------------------------------------------------------------------------------------------------------------ Invalid input(s): POCBNP    Assessment & Plan   1. Diabetes type 2 patient's blood sugars have increased currently due to her nothing by mouth status she is only on sliding scale insulin, she is receiving D5 containing IV fluids so we'll go ahead and change that to normal saline fluids  2. Hypertension we'll add when necessary hydralazine  3. GERD continue Protonix  4. Miscellaneous heparin for DVT prophylaxis    Code Status Orders        Start     Ordered   07/25/15 2109  Full code   Continuous     07/25/15 2110              DVT Prophylaxis   Heparin  Lab Results  Component Value Date   PLT 180 07/27/2015     Time Spent in minutes  35 minutes   Auburn Bilberry M.D on 07/27/2015 at  11:42 AM  Between 7am to 6pm - Pager - [phone]  After 6pm go to [email or web address] - password [email or web address]  [facility]   Office  [phone]

## 2015-07-28 LAB — GLUCOSE, CAPILLARY
Glucose-Capillary: 152 mg/dL — ABNORMAL HIGH (ref 65–99)
Glucose-Capillary: 302 mg/dL — ABNORMAL HIGH (ref 65–99)

## 2015-07-28 MED ORDER — LISINOPRIL 10 MG PO TABS: 10.0000 mg | ORAL_TABLET | Freq: Every day | ORAL | Status: DC

## 2015-07-28 NOTE — Progress Notes (Signed)
Patient ID: Regina Short, female   DOB: March 09, 1961, 54 y.o.   MRN: [id number]   [facility]   PATIENT NAME: Regina Short    MR#:  [id number]  DATE OF BIRTH:  [birth year]  SUBJECTIVE:   She is passing gas. She tolerated the nasogastric tube removal yesterday afternoon without incident. There is been no nausea no vomiting she is tolerating a full liquid diet. She is interested in regular food. She is complaining of some left lower quadrant abdominal pain around an old laparoscopy site.  REVIEW OF SYSTEMS:   Review of Systems  Constitutional: Negative for fever and chills.  Respiratory: Negative for cough.   Cardiovascular: Negative for chest pain.  Gastrointestinal: Positive for abdominal pain. Negative for nausea and vomiting.  All other systems reviewed and are negative.   DRUG ALLERGIES:  No Known Allergies  VITALS:  Blood pressure 157/74, pulse 52, temperature 97.8 F (36.6 C), temperature source Oral, resp. rate 20, height  (1.676 m), weight 77.429 kg (170 lb 11.2 oz), SpO2 100 %.  PHYSICAL EXAMINATION:   Physical Exam  Constitutional: She is oriented to person, place, and time and well-developed, well-nourished, and in no distress. No distress.  HENT:  Head: Normocephalic and atraumatic.  Eyes: Pupils are equal, round, and reactive to light.  Cardiovascular: Normal rate and regular rhythm.   Pulmonary/Chest: Breath sounds normal.  Abdominal: Soft. She exhibits no distension. There is tenderness in the left lower quadrant. There is no rigidity, no rebound and no guarding.    Neurological: She is oriented to person, place, and time.  Skin: Skin is warm. She is not diaphoretic.  Psychiatric: Affect and judgment normal.     ASSESSMENT AND PLAN:   54 year old female with resolved partial small bowel obstruction likely secondary to adhesions. I'm unclear why she is having tenderness now over an old laparoscopy site in the left lower quadrant as described  above.    At any rate I do not see any indication for operative intervention this morning given the normal-appearing x-rays and improvement in her abdominal examination.   Diet will be advanced to soft diet. I'll reassess her later this afternoon if she is stable she can be discharged home.

## 2015-07-28 NOTE — Progress Notes (Signed)
Pt to be discharged per Md order. Iv removed. Instructions given to pt and family with all questions answered. Will be taken out in wheelchair by staff

## 2015-07-28 NOTE — Discharge Instructions (Signed)

## 2015-07-28 NOTE — Progress Notes (Signed)
Patient ID: Regina Short, female   DOB: August 23, 1961, 54 y.o.   MRN: [id number] [facility] PROGRESS NOTE  PCP: Lyndon Code, MD  HPI/Subjective: Patient still having some abdominal soreness especially in the left lower quadrant than prior surgical site. Patient states that she had a bowel movement this morning.  Objective: Filed Vitals:   07/28/15 0733  BP: 157/74  Pulse: 52  Temp: 97.8 F (36.6 C)  Resp:     Filed Weights   07/25/15 1728 07/26/15 0017  Weight: 72.576 kg (160 lb) 77.429 kg (170 lb 11.2 oz)    ROS: Review of Systems  Constitutional: Negative for fever and chills.  Eyes: Negative for blurred vision.  Respiratory: Negative for cough and shortness of breath.   Cardiovascular: Negative for chest pain.  Gastrointestinal: Positive for abdominal pain. Negative for nausea, vomiting, diarrhea and constipation.  Genitourinary: Negative for dysuria.  Musculoskeletal: Negative for joint pain.  Neurological: Negative for dizziness and headaches.   Exam: Physical Exam  Constitutional: She is oriented to person, place, and time.  HENT:  Nose: No mucosal edema.  Mouth/Throat: No oropharyngeal exudate or posterior oropharyngeal edema.  Eyes: Conjunctivae, EOM and lids are normal. Pupils are equal, round, and reactive to light.  Neck: No JVD present. Carotid bruit is not present. No edema present. No thyroid mass and no thyromegaly present.  Cardiovascular: Regular rhythm, S1 normal and S2 normal.  Exam reveals no gallop.   No murmur heard. Pulses:      Dorsalis pedis pulses are 2+ on the right side, and 2+ on the left side.  Respiratory: No respiratory distress. She has no wheezes. She has no rhonchi. She has no rales.  GI: Soft. Bowel sounds are normal. There is generalized tenderness.  Musculoskeletal:       Right ankle: She exhibits no swelling.       Left ankle: She exhibits no swelling.  Lymphadenopathy:    She has no cervical adenopathy.   Neurological: She is alert and oriented to person, place, and time. No cranial nerve deficit.  Skin: Skin is warm. No rash noted. Nails show no clubbing.  Psychiatric: She has a normal mood and affect.    Data Reviewed: Basic Metabolic Panel:  Recent Labs Lab 07/25/15 1734 07/26/15 0312 07/27/15 0955  NA 140 137 137  K 4.1 3.9 5.2*  CL 104 105 102  CO2 27 25 30   GLUCOSE 143* 225* 211*  BUN 16 10 6   CREATININE 0.52 0.49 0.58  CALCIUM 9.6 9.1 9.4   Liver Function Tests:  Recent Labs Lab 07/25/15 1734  AST 20  ALT 15  ALKPHOS 101  BILITOT 0.2*  PROT 7.2  ALBUMIN 4.3   CBC:  Recent Labs Lab 07/25/15 1734 07/26/15 0312 07/27/15 0955  WBC 6.0 7.5 4.1  NEUTROABS 3.6  --   --   HGB 13.0 12.6 12.7  HCT 39.5 38.7 38.9  MCV 77.0* 77.9* 77.4*  PLT 173 183 180   CBG:  Recent Labs Lab 07/27/15 1118 07/27/15 1704 07/27/15 2100 07/28/15 0732 07/28/15 1209  GLUCAP 164* 182* 123* 152* 302*   Studies: Dg Abd 2 Views  07/27/2015   CLINICAL DATA:  New nasogastric tube. Recent small bowel obstruction  EXAM: ABDOMEN - 2 VIEW  COMPARISON:  July 26, 2015  FINDINGS: Supine and upright images obtained. Nasogastric tube tip and side port in stomach. There is no longer bowel dilatation. No air-fluid levels. Contrast is seen in the colon. No free  air. No abnormal calcifications. There is mild atelectasis in the right lung base. Lung bases are otherwise clear.  IMPRESSION: Bowel gas pattern unremarkable. Contrast in colon. Nasogastric tube tip and side port in stomach. Right base atelectasis. Lung bases otherwise clear.   Electronically Signed   By: Bretta Bang III M.D.   On: 07/27/2015 10:21    Scheduled Meds: . heparin  5,000 Units Subcutaneous 3 times per day  . insulin aspart  0-9 Units Subcutaneous TID WC  . lisinopril  10 mg Oral Daily  . pantoprazole (PROTONIX) IV  40 mg Intravenous Q12H   Continuous Infusions: . sodium chloride 50 mL/hr at 07/28/15 0927     Assessment/Plan:  1. Essential hypertension- restart lisinopril. 2. Type 2 diabetes without complication- patient just started on a diet will continue to monitor on sliding scale. Likely can restart oral medications tomorrow versus as outpatient. 3. Small bowel obstruction status post surgery. Patient had a bowel movement and diet has advanced. 4. Gastroesophageal reflux disease without esophagitis on Protonix.  Code Status:     Code Status Orders        Start     Ordered   07/25/15 2109  Full code   Continuous     07/25/15 2110     Family Communication: Sister at bedside Disposition Plan: Home soon  Time spent: 20 minutes  Alford Highland  [facility]

## 2015-07-28 NOTE — Discharge Summary (Signed)
Physician Discharge Summary  Patient ID: Regina Short MRN: [id number] DOB/AGE: 03/07/1961 54 y.o.  Admit date: 07/25/2015 Discharge date: 07/28/2015  Admission Diagnoses: Small bowel obstruction  Discharge Diagnoses:  Active Problems:   Obstruction of intestine   Generalized abdominal pain   SBO (small bowel obstruction)   Hospital Course:   The patient was admitted with an adhesive small bowel obstruction. A nasogastric tube was placed. Her films initially looked a little worse and she continued to have pain however nasogastric tube did not put out much. She did pass some gas following have a bowel movement and repeat films on hospital day #2 demonstrated contrast seen within the colon as well as air and resolution of the bowel obstruction. Nasogastric tube was removed. She was started on a liquid diet and this was advanced on August 6 to a regular diet and she tolerated this well without any pain.  the decision was for her to be discharged with follow-up in the office in 5-7 days.   Consults:  internal medicine   Significant Diagnostic Studies: CT scan of the abdomen, multiple plain films of the abdomen.   Treatments: Nasogastric tube.   Disposition: 01-Home or Self Care  Discharge Instructions    Call MD for:  persistant nausea and vomiting    Complete by:  As directed      Call MD for:  severe uncontrolled pain    Complete by:  As directed      Call MD for:  temperature >100.4    Complete by:  As directed      Diet general    Complete by:  As directed             Medication List    TAKE these medications        cyclobenzaprine 10 MG tablet  Commonly known as:  FLEXERIL  Take 1 tablet by mouth daily.     glyBURIDE-metformin 5-500 MG per tablet  Commonly known as:  GLUCOVANCE  Take 1 tablet by mouth 2 (two) times daily.     lisinopril 10 MG tablet  Commonly known as:  PRINIVIL,ZESTRIL  Take 1 tablet by mouth daily.     ONGLYZA 5 MG Tabs tablet  Generic drug:   saxagliptin HCl  Take 1 tablet by mouth daily.     ranitidine 150 MG capsule  Commonly known as:  ZANTAC  Take 1 capsule (150 mg total) by mouth 2 (two) times daily.     traMADol 50 MG tablet  Commonly known as:  ULTRAM  Take 1 tablet by mouth 2 (two) times daily.           Follow-up Information    Follow up with Ricarda Frame, MD In 1 week.   Specialty:  General Surgery   Contact information:   [address] [place] [address] [phone]       Signed: Natale Lay 07/28/2015, 2:36 PM

## 2015-08-02 ENCOUNTER — Ambulatory Visit (INDEPENDENT_AMBULATORY_CARE_PROVIDER_SITE_OTHER): Payer: No Typology Code available for payment source | Admitting: Surgery

## 2015-08-02 ENCOUNTER — Encounter: Payer: Self-pay | Admitting: Surgery

## 2015-08-02 VITALS — BP 171/96 | HR 75 | Temp 98.4°F | Ht 66.0 in | Wt 163.2 lb

## 2015-08-02 DIAGNOSIS — K5669 Other intestinal obstruction: Secondary | ICD-10-CM

## 2015-08-02 DIAGNOSIS — K565 Intestinal adhesions [bands], unspecified as to partial versus complete obstruction: Secondary | ICD-10-CM

## 2015-08-02 DIAGNOSIS — R1084 Generalized abdominal pain: Secondary | ICD-10-CM | POA: Diagnosis not present

## 2015-08-02 DIAGNOSIS — K56609 Unspecified intestinal obstruction, unspecified as to partial versus complete obstruction: Secondary | ICD-10-CM

## 2015-08-02 NOTE — Patient Instructions (Signed)
Please call our office with any questions or concerns.  Please see the return to work note that you have been given today.

## 2015-08-03 DIAGNOSIS — M7050 Other bursitis of knee, unspecified knee: Secondary | ICD-10-CM | POA: Insufficient documentation

## 2015-08-06 NOTE — Progress Notes (Signed)
Patient ID: Regina Short, female   DOB: 1961-01-09, 54 y.o.   MRN: [id number]  Chief Complaint  Patient presents with  . Follow-up    Small Bowel Obstruction    HPI Location, Quality, Duration, Severity, Timing, Context, Modifying Factors, Associated Signs and Symptoms.  Regina Short is a 54 y.o. female. Recently admitted and released from hospital with what looked like an adhesive small bowel obstruction.  She has a prior history of hysterectomy.  Since discharge she has had no further nausea emesis of abdominal pain.  She is here in office for follow-up.  Past Medical History  Diagnosis Date  . Diabetes mellitus without complication   . Hypertension   . GERD (gastroesophageal reflux disease)   . Chronic back pain     Past Surgical History  Procedure Laterality Date  . Abdominal hysterectomy    . Abdominal surgery      5 tumors removed    Family History  Problem Relation Age of Onset  . Congestive Heart Failure      Social History Social History  Substance Use Topics  . Smoking status: Former Smoker    Quit date: 11/20/2014  . Smokeless tobacco: Former Neurosurgeon  . Alcohol Use: No     Comment: occassional drinks, last drink 6 months ago     Allergies  Allergen Reactions  . Ciprofloxacin Rash    Current Outpatient Prescriptions  Medication Sig Dispense Refill  . cyclobenzaprine (FLEXERIL) 10 MG tablet Take 1 tablet by mouth daily.    Marland Kitchen glyBURIDE-metformin (GLUCOVANCE) 5-500 MG per tablet Take 1 tablet by mouth 2 (two) times daily.    Marland Kitchen lisinopril (PRINIVIL,ZESTRIL) 10 MG tablet Take 1 tablet by mouth daily.    . ONGLYZA 5 MG TABS tablet Take 1 tablet by mouth daily.    . ranitidine (ZANTAC) 150 MG capsule Take 1 capsule (150 mg total) by mouth 2 (two) times daily. (Patient taking differently: Take 150 mg by mouth 2 (two) times daily as needed. ) 28 capsule 0   No current facility-administered medications for this visit.    Blood pressure 171/96, pulse 75,  temperature 98.4 F (36.9 C), temperature source Oral, height  (1.676 m), weight 163 lb 3.2 oz (74.027 kg).   Review of Systems  Constitutional: Negative for fever, chills, weight loss and malaise/fatigue.  Eyes: Negative.   Respiratory: Negative.   Cardiovascular: Negative.   Gastrointestinal: Negative for heartburn, nausea, vomiting, abdominal pain, diarrhea, constipation, blood in stool and melena.  Genitourinary: Negative for dysuria and urgency.  Neurological: Negative.   Endo/Heme/Allergies: Negative.   Psychiatric/Behavioral: Negative.     Physical Exam  Constitutional: She is oriented to person, place, and time and well-developed, well-nourished, and in no distress. No distress.  HENT:  Head: Normocephalic.  Eyes: Conjunctivae are normal. Pupils are equal, round, and reactive to light.  Neck: Normal range of motion.  Cardiovascular: Normal rate and regular rhythm.   Pulmonary/Chest: Breath sounds normal.  Abdominal: Bowel sounds are normal. She exhibits no distension and no mass. There is no tenderness. There is no rebound and no guarding.  Neurological: She is oriented to person, place, and time.  Skin: She is not diaphoretic.  Psychiatric: Memory, affect and judgment normal.    Assessment    Resolved adhesive SBO.  No prior history of SBO,    Plan    We discussed the nature history of this problem with possiblity of future SBO which may require surgical intervention.  I will  see her back in office as needed.        Natale Lay MD, FACS 08/06/2015, 7:57 PM

## 2016-02-04 ENCOUNTER — Encounter: Payer: Self-pay | Admitting: Emergency Medicine

## 2016-02-04 ENCOUNTER — Emergency Department
Admission: EM | Admit: 2016-02-04 | Discharge: 2016-02-04 | Disposition: A | Discharge status: Home / Self Care | Payer: BLUE CROSS/BLUE SHIELD | Attending: Emergency Medicine | Admitting: Emergency Medicine

## 2016-02-04 DIAGNOSIS — Z79899 Other long term (current) drug therapy: Secondary | ICD-10-CM | POA: Insufficient documentation

## 2016-02-04 DIAGNOSIS — R531 Weakness: Secondary | ICD-10-CM | POA: Insufficient documentation

## 2016-02-04 DIAGNOSIS — Z87891 Personal history of nicotine dependence: Secondary | ICD-10-CM | POA: Insufficient documentation

## 2016-02-04 DIAGNOSIS — E1165 Type 2 diabetes mellitus with hyperglycemia: Secondary | ICD-10-CM | POA: Insufficient documentation

## 2016-02-04 DIAGNOSIS — I10 Essential (primary) hypertension: Secondary | ICD-10-CM | POA: Diagnosis not present

## 2016-02-04 DIAGNOSIS — R739 Hyperglycemia, unspecified: Secondary | ICD-10-CM

## 2016-02-04 HISTORY — DX: Enthesopathy, unspecified: M77.9

## 2016-02-04 HISTORY — DX: Other enthesopathies, not elsewhere classified: M77.8

## 2016-02-04 LAB — URINALYSIS COMPLETE WITH MICROSCOPIC (ARMC ONLY)
BACTERIA UA: NONE SEEN
Bilirubin Urine: NEGATIVE
LEUKOCYTES UA: NEGATIVE
Nitrite: NEGATIVE
PH: 5 (ref 5.0–8.0)
PROTEIN: NEGATIVE mg/dL
SPECIFIC GRAVITY, URINE: 1.033 — AB (ref 1.005–1.030)

## 2016-02-04 LAB — BASIC METABOLIC PANEL
Anion gap: 14 (ref 5–15)
BUN: 13 mg/dL (ref 6–20)
CHLORIDE: 101 mmol/L (ref 101–111)
CO2: 20 mmol/L — AB (ref 22–32)
Calcium: 9.5 mg/dL (ref 8.9–10.3)
Creatinine, Ser: 0.76 mg/dL (ref 0.44–1.00)
GFR calc non Af Amer: >60 mL/min (ref >60)
Glucose, Bld: 427 mg/dL — ABNORMAL HIGH (ref 65–99)
POTASSIUM: 3.7 mmol/L (ref 3.5–5.1)
SODIUM: 135 mmol/L (ref 135–145)

## 2016-02-04 LAB — GLUCOSE, CAPILLARY
GLUCOSE-CAPILLARY: 369 mg/dL — AB (ref 65–99)
Glucose-Capillary: 288 mg/dL — ABNORMAL HIGH (ref 65–99)
Glucose-Capillary: 215 mg/dL — ABNORMAL HIGH (ref 65–99)

## 2016-02-04 LAB — CBC
HEMATOCRIT: 39.8 % (ref 35.0–47.0)
HEMOGLOBIN: 13.2 g/dL (ref 12.0–16.0)
MCH: 25.2 pg — ABNORMAL LOW (ref 26.0–34.0)
MCHC: 33.2 g/dL (ref 32.0–36.0)
MCV: 75.9 fL — AB (ref 80.0–100.0)
Platelets: 193 10*3/uL (ref 150–440)
RBC: 5.24 MIL/uL — AB (ref 3.80–5.20)
RDW: 16.5 % — ABNORMAL HIGH (ref 11.5–14.5)
WBC: 5.2 10*3/uL (ref 3.6–11.0)

## 2016-02-04 NOTE — ED Notes (Signed)
Patient presents to the ED with hyperglycemia.  Patient states this morning about 9:10am she started feeling very poorly.  Patient reports feeling weak and nauseous.  Patient checked her blood sugar and it was 400.  Patient states she was out of her blood sugar medication for about 1 week due to an insurance change.  Patient states she takes a combo medication that has metformin and glipizide as well as another medication that starts with an an O.  Patient is in no obvious distress at this time.

## 2016-02-04 NOTE — Discharge Instructions (Signed)
Hyperglycemia °Hyperglycemia occurs when the glucose (sugar) in your blood is too high. Hyperglycemia can happen for many reasons, but it most often happens to people who do not know they have diabetes or are not managing their diabetes properly.  °CAUSES  °Whether you have diabetes or not, there are other causes of hyperglycemia. Hyperglycemia can occur when you have diabetes, but it can also occur in other situations that you might not be as aware of, such as: °Diabetes °· If you have diabetes and are having problems controlling your blood glucose, hyperglycemia could occur because of some of the following reasons: °¨ Not following your meal plan. °¨ Not taking your diabetes medications or not taking it properly. °¨ Exercising less or doing less activity than you normally do. °¨ Being sick. °Pre-diabetes °· This cannot be ignored. Before people develop Type 2 diabetes, they almost always have "pre-diabetes." This is when your blood glucose levels are higher than normal, but not yet high enough to be diagnosed as diabetes. Research has shown that some long-term damage to the body, especially the heart and circulatory system, may already be occurring during pre-diabetes. If you take action to manage your blood glucose when you have pre-diabetes, you may delay or prevent Type 2 diabetes from developing. °Stress °· If you have diabetes, you may be "diet" controlled or on oral medications or insulin to control your diabetes. However, you may find that your blood glucose is higher than usual in the hospital whether you have diabetes or not. This is often referred to as "stress hyperglycemia." Stress can elevate your blood glucose. This happens because of hormones put out by the body during times of stress. If stress has been the cause of your high blood glucose, it can be followed regularly by your caregiver. That way he/she can make sure your hyperglycemia does not continue to get worse or progress to  diabetes. °Steroids °· Steroids are medications that act on the infection fighting system (immune system) to block inflammation or infection. One side effect can be a rise in blood glucose. Most people can produce enough extra insulin to allow for this rise, but for those who cannot, steroids make blood glucose levels go even higher. It is not unusual for steroid treatments to "uncover" diabetes that is developing. It is not always possible to determine if the hyperglycemia will go away after the steroids are stopped. A special blood test called an A1c is sometimes done to determine if your blood glucose was elevated before the steroids were started. °SYMPTOMS °· Thirsty. °· Frequent urination. °· Dry mouth. °· Blurred vision. °· Tired or fatigue. °· Weakness. °· Sleepy. °· Tingling in feet or leg. °DIAGNOSIS  °Diagnosis is made by monitoring blood glucose in one or all of the following ways: °· A1c test. This is a chemical found in your blood. °· Fingerstick blood glucose monitoring. °· Laboratory results. °TREATMENT  °First, knowing the cause of the hyperglycemia is important before the hyperglycemia can be treated. Treatment may include, but is not be limited to: °· Education. °· Change or adjustment in medications. °· Change or adjustment in meal plan. °· Treatment for an illness, infection, etc. °· More frequent blood glucose monitoring. °· Change in exercise plan. °· Decreasing or stopping steroids. °· Lifestyle changes. °HOME CARE INSTRUCTIONS  °· Test your blood glucose as directed. °· Exercise regularly. Your caregiver will give you instructions about exercise. Pre-diabetes or diabetes which comes on with stress is helped by exercising. °· Eat wholesome,   balanced meals. Eat often and at regular, fixed times. Your caregiver or nutritionist will give you a meal plan to guide your sugar intake. °· Being at an ideal weight is important. If needed, losing as little as 10 to 15 pounds may help improve blood  glucose levels. °SEEK MEDICAL CARE IF:  °· You have questions about medicine, activity, or diet. °· You continue to have symptoms (problems such as increased thirst, urination, or weight gain). °SEEK IMMEDIATE MEDICAL CARE IF:  °· You are vomiting or have diarrhea. °· Your breath smells fruity. °· You are breathing faster or slower. °· You are very sleepy or incoherent. °· You have numbness, tingling, or pain in your feet or hands. °· You have chest pain. °· Your symptoms get worse even though you have been following your caregiver's orders. °· If you have any other questions or concerns. °  °This information is not intended to replace advice given to you by your health care provider. Make sure you discuss any questions you have with your health care provider. °  °Document Released: 06/03/2001 Document Revised: 03/01/2012 Document Reviewed: 08/14/2015 °Elsevier Interactive Patient Education ©2016 [employer] ° °

## 2016-02-04 NOTE — ED Notes (Signed)
AAOx3.  Skin warm and dry. NAD.  Ambulates with easy and steady gait.   

## 2016-02-04 NOTE — ED Provider Notes (Signed)
[facility] Emergency Department Provider Note  ____________________________________________  Time seen: 12:40 AM  I have reviewed the triage vital signs and the nursing notes.   HISTORY  Chief Complaint Hyperglycemia      HPI Regina Short is a 55 y.o. female resents with history of hyperglycemia this morning. Per the patient her glucose read greater than 500 today at home. Patient admits to recent interruption in insurance and a such was out of her metformin and Onglyza until Friday. Patient's glucose on presentation to the emergency department was 427 most recent capillary glucose is 288. Patient admits to taking both of her medications before arriving to the emergency department.   Past Medical History  Diagnosis Date  . Diabetes mellitus without complication (HCC)   . Hypertension   . GERD (gastroesophageal reflux disease)   . Chronic back pain   . Hand tendonitis     Patient Active Problem List   Diagnosis Date Noted  . SBO (small bowel obstruction) (HCC)   . Obstruction of intestine (HCC) 07/25/2015    Past Surgical History  Procedure Laterality Date  . Abdominal hysterectomy    . Abdominal surgery      5 tumors removed    Current Outpatient Rx  Name  Route  Sig  Dispense  Refill  . cyclobenzaprine (FLEXERIL) 10 MG tablet   Oral   Take 1 tablet by mouth daily.         Marland Kitchen glyBURIDE-metformin (GLUCOVANCE) 5-500 MG per tablet   Oral   Take 1 tablet by mouth 2 (two) times daily.         Marland Kitchen lisinopril (PRINIVIL,ZESTRIL) 10 MG tablet   Oral   Take 1 tablet by mouth daily.         . ONGLYZA 5 MG TABS tablet   Oral   Take 1 tablet by mouth daily.           Dispense as written.   . ranitidine (ZANTAC) 150 MG capsule   Oral   Take 1 capsule (150 mg total) by mouth 2 (two) times daily. Patient taking differently: Take 150 mg by mouth 2 (two) times daily as needed.    28 capsule   0     Allergies Ciprofloxacin  Family  History  Problem Relation Age of Onset  . Congestive Heart Failure      Social History Social History  Substance Use Topics  . Smoking status: Former Smoker    Quit date: 11/20/2014  . Smokeless tobacco: Former Neurosurgeon  . Alcohol Use: No     Comment: occassional drinks, last drink 6 months ago     Review of Systems  Constitutional: Negative for fever. Eyes: Negative for visual changes. ENT: Negative for sore throat. Cardiovascular: Negative for chest pain. Respiratory: Negative for shortness of breath. Gastrointestinal: Negative for abdominal pain, vomiting and diarrhea. Genitourinary: Negative for dysuria. Musculoskeletal: Negative for back pain. Skin: Negative for rash. Neurological: Positive for left facial weakness   10-point ROS otherwise negative.  ____________________________________________   PHYSICAL EXAM:  VITAL SIGNS: ED Triage Vitals  Enc Vitals Group     BP 02/04/16 0959 143/91 mmHg     Pulse Rate 02/04/16 0959 83     Resp 02/04/16 0959 18     Temp 02/04/16 0959 98.2 F (36.8 C)     Temp Source 02/04/16 0959 Oral     SpO2 02/04/16 0959 99 %     Weight 02/04/16 0959 162 lb (73.483 kg)  Height 02/04/16 0959  (1.676 m)     Head Cir --      Peak Flow --      Pain Score 02/04/16 1000 10     Pain Loc --      Pain Edu? --      Excl. in GC? --      Constitutional: Alert and oriented. Well appearing and in no distress. Eyes: Conjunctivae are normal. PERRL. Normal extraocular movements. ENT   Head: Normocephalic and atraumatic.   Nose: No congestion/rhinnorhea.   Mouth/Throat: Mucous membranes are moist.   Neck: No stridor. Hematological/Lymphatic/Immunilogical: No cervical lymphadenopathy. Cardiovascular: Normal rate, regular rhythm. Normal and symmetric distal pulses are present in all extremities. No murmurs, rubs, or gallops. Respiratory: Normal respiratory effort without tachypnea nor retractions. Breath sounds are clear and  equal bilaterally. No wheezes/rales/rhonchi. Gastrointestinal: Soft and nontender. No distention. There is no CVA tenderness. Genitourinary: deferred Musculoskeletal: Nontender with normal range of motion in all extremities. No joint effusions.  No lower extremity tenderness nor edema. Neurologic:  Normal speech and language.  Psychiatric: Mood and affect are normal. Speech and behavior are normal. Patient exhibits appropriate insight and judgment.  ____________________________________________    LABS (pertinent positives/negatives)  Labs Reviewed  BASIC METABOLIC PANEL - Abnormal; Notable for the following:    CO2 20 (*)    Glucose, Bld 427 (*)    All other components within normal limits  CBC - Abnormal; Notable for the following:    RBC 5.24 (*)    MCV 75.9 (*)    MCH 25.2 (*)    RDW 16.5 (*)    All other components within normal limits  URINALYSIS COMPLETEWITH MICROSCOPIC ([facility] ONLY) - Abnormal; Notable for the following:    Color, Urine YELLOW (*)    APPearance CLEAR (*)    Glucose, UA >500 (*)    Ketones, ur TRACE (*)    Specific Gravity, Urine 1.033 (*)    Hgb urine dipstick 1+ (*)    Squamous Epithelial / LPF 0-5 (*)    All other components within normal limits  GLUCOSE, CAPILLARY - Abnormal; Notable for the following:    Glucose-Capillary 369 (*)    All other components within normal limits  GLUCOSE, CAPILLARY - Abnormal; Notable for the following:    Glucose-Capillary 288 (*)    All other components within normal limits  CBG MONITORING, ED      INITIAL IMPRESSION / ASSESSMENT AND PLAN / ED COURSE  Pertinent labs & imaging results that were available during my care of the patient were reviewed by me and considered in my medical decision making (see chart for details). History of physical exam consistent with hyperglycemia secondary to recent change in insurance coverage. I advised patient to take 1000 mg of metformin if glucose exceeds 300 at home and to  follow-up with Dr. Doylene Canard primary care provider in the morning.  ____________________________________________   FINAL CLINICAL IMPRESSION(S) / ED DIAGNOSES  Final diagnoses:  Hyperglycemia      Darci Current, MD 02/04/16 1312

## 2016-02-04 NOTE — ED Notes (Signed)
Patient states was without Metformin for one week due to changing insurance.  Restarted taking Metformin on Friday 2/10.  Blood sugar's have been elevated all weekend, and today began feeling weak at work.  Patient is AAOx3.  Skin warm and dry.  NAD.  Ambulates with easy and steady gait.

## 2016-02-18 ENCOUNTER — Other Ambulatory Visit: Payer: Self-pay | Admitting: Nurse Practitioner

## 2016-02-18 DIAGNOSIS — Z1231 Encounter for screening mammogram for malignant neoplasm of breast: Secondary | ICD-10-CM

## 2016-02-22 ENCOUNTER — Ambulatory Visit
Admission: RE | Admit: 2016-02-22 | Discharge: 2016-02-22 | Disposition: A | Discharge status: Home / Self Care | Payer: BLUE CROSS/BLUE SHIELD | Source: Ambulatory Visit | Attending: Nurse Practitioner | Admitting: Nurse Practitioner

## 2016-02-22 DIAGNOSIS — Z1231 Encounter for screening mammogram for malignant neoplasm of breast: Secondary | ICD-10-CM | POA: Insufficient documentation

## 2016-11-07 DIAGNOSIS — M6530 Trigger finger, unspecified finger: Secondary | ICD-10-CM | POA: Insufficient documentation

## 2017-04-28 ENCOUNTER — Other Ambulatory Visit: Payer: Self-pay | Admitting: Internal Medicine

## 2017-04-28 DIAGNOSIS — Z1231 Encounter for screening mammogram for malignant neoplasm of breast: Secondary | ICD-10-CM

## 2017-05-15 ENCOUNTER — Ambulatory Visit
Admission: RE | Admit: 2017-05-15 | Discharge: 2017-05-15 | Disposition: A | Discharge status: Home / Self Care | Payer: BLUE CROSS/BLUE SHIELD | Source: Ambulatory Visit | Attending: Internal Medicine | Admitting: Internal Medicine

## 2017-05-15 DIAGNOSIS — Z1231 Encounter for screening mammogram for malignant neoplasm of breast: Secondary | ICD-10-CM | POA: Diagnosis not present

## 2017-07-16 ENCOUNTER — Encounter: Payer: Self-pay | Admitting: Emergency Medicine

## 2017-07-16 DIAGNOSIS — Z5321 Procedure and treatment not carried out due to patient leaving prior to being seen by health care provider: Secondary | ICD-10-CM | POA: Diagnosis not present

## 2017-07-16 DIAGNOSIS — R51 Headache: Secondary | ICD-10-CM | POA: Insufficient documentation

## 2017-07-16 NOTE — ED Triage Notes (Addendum)
Patient ambulatory to triage with steady gait, without difficulty or distress noted; pt reports HA to temples tonight [facility]rel[facility]; st hx of same; pt denies any other c/o or accompanying symptoms

## 2017-07-17 ENCOUNTER — Emergency Department
Admission: EM | Admit: 2017-07-17 | Discharge: 2017-07-17 | Disposition: A | Discharge status: Home / Self Care | Payer: BLUE CROSS/BLUE SHIELD | Attending: Emergency Medicine | Admitting: Emergency Medicine

## 2017-11-28 ENCOUNTER — Emergency Department
Admission: EM | Admit: 2017-11-28 | Discharge: 2017-11-28 | Disposition: A | Discharge status: Home / Self Care | Payer: BLUE CROSS/BLUE SHIELD | Attending: Emergency Medicine | Admitting: Emergency Medicine

## 2017-11-28 ENCOUNTER — Emergency Department: Payer: BLUE CROSS/BLUE SHIELD

## 2017-11-28 ENCOUNTER — Other Ambulatory Visit: Payer: Self-pay

## 2017-11-28 ENCOUNTER — Encounter: Payer: Self-pay | Admitting: *Deleted

## 2017-11-28 DIAGNOSIS — I10 Essential (primary) hypertension: Secondary | ICD-10-CM | POA: Insufficient documentation

## 2017-11-28 DIAGNOSIS — Y929 Unspecified place or not applicable: Secondary | ICD-10-CM | POA: Insufficient documentation

## 2017-11-28 DIAGNOSIS — E119 Type 2 diabetes mellitus without complications: Secondary | ICD-10-CM | POA: Diagnosis not present

## 2017-11-28 DIAGNOSIS — S60221A Contusion of right hand, initial encounter: Secondary | ICD-10-CM | POA: Diagnosis not present

## 2017-11-28 DIAGNOSIS — Y939 Activity, unspecified: Secondary | ICD-10-CM | POA: Diagnosis not present

## 2017-11-28 DIAGNOSIS — F17200 Nicotine dependence, unspecified, uncomplicated: Secondary | ICD-10-CM | POA: Diagnosis not present

## 2017-11-28 DIAGNOSIS — W230XXA Caught, crushed, jammed, or pinched between moving objects, initial encounter: Secondary | ICD-10-CM | POA: Diagnosis not present

## 2017-11-28 DIAGNOSIS — Z79899 Other long term (current) drug therapy: Secondary | ICD-10-CM | POA: Insufficient documentation

## 2017-11-28 DIAGNOSIS — S6991XA Unspecified injury of right wrist, hand and finger(s), initial encounter: Secondary | ICD-10-CM | POA: Diagnosis present

## 2017-11-28 DIAGNOSIS — Y999 Unspecified external cause status: Secondary | ICD-10-CM | POA: Insufficient documentation

## 2017-11-28 MED ORDER — MELOXICAM 15 MG PO TABS: 15.0000 mg | ORAL_TABLET | Freq: Every day | ORAL | 2 refills | Status: DC

## 2017-11-28 NOTE — ED Triage Notes (Signed)
Pt to ED reporting having had a screen door shut on right hand. Pt reports a hx of trigger finger in right fourth finger that has become worse after injury today. No laceration noted. Swelling at the joint.

## 2017-11-28 NOTE — ED Notes (Signed)
  Esign not working. Pt verbalized discharge instructions and has no questions at this time. 

## 2017-11-28 NOTE — ED Provider Notes (Signed)
[facility] Emergency Department Provider Note  ____________________________________________   First MD Initiated Contact with Patient 11/28/17 1621     (approximate)  I have reviewed the triage vital signs and the nursing notes.   HISTORY  Chief Complaint Hand Pain    HPI Regina Short is a 56 y.o. female states she slammed her hand with the screen door today, she has pain in the right fourth finger, she states the hand is swollen, she has a history of trigger finger, is followed by Dr. Hyacinth MeekerMiller, she denies any other injury  Past Medical History:  Diagnosis Date  . Chronic back pain   . Diabetes mellitus without complication (HCC)   . GERD (gastroesophageal reflux disease)   . Hand tendonitis   . Hypertension     Patient Active Problem List   Diagnosis Date Noted  . SBO (small bowel obstruction) (HCC)   . Obstruction of intestine (HCC) 07/25/2015    Past Surgical History:  Procedure Laterality Date  . ABDOMINAL HYSTERECTOMY    . ABDOMINAL SURGERY     5 tumors removed  . BREAST BIOPSY Right 02/02/15 core   focal fibroadenomatoid changes and sclerosis    Prior to Admission medications   Medication Sig Start Date End Date Taking? Authorizing Provider  cyclobenzaprine (FLEXERIL) 10 MG tablet Take 1 tablet by mouth daily. 07/20/15   [provider]  glyBURIDE-metformin (GLUCOVANCE) 5-500 MG per tablet Take 1 tablet by mouth 2 (two) times daily. 05/31/15   [provider]  lisinopril (PRINIVIL,ZESTRIL) 10 MG tablet Take 1 tablet by mouth daily. 07/04/15   [provider]  meloxicam (MOBIC) 15 MG tablet Take 1 tablet (15 mg total) by mouth daily. 11/28/17 11/28/18  Fisher, Roselyn BeringSusan W, PA  ONGLYZA 5 MG TABS tablet Take 1 tablet by mouth daily. 07/20/15   [provider]    Allergies Ciprofloxacin and Tramadol  Family History  Problem Relation Age of Onset  . Congestive Heart Failure Unknown   . Breast cancer  Maternal Aunt [id number]    Social History Social History   Tobacco Use  . Smoking status: Current Every Day Smoker    Packs/day: 0.50    Last attempt to quit: 11/20/2014    Years since quitting: 3.0  . Smokeless tobacco: Former Engineer, waterUser  Substance Use Topics  . Alcohol use: No    Alcohol/week: 0.0 oz    Comment: occassional drinks, last drink 6 months ago   . Drug use: No    Review of Systems  Constitutional: No fever/chills Eyes: No visual changes. ENT: No sore throat. Respiratory: Denies cough Genitourinary: Negative for dysuria. Musculoskeletal: Negative for back pain.  Positive for right hand pain Skin: Negative for rash.    ____________________________________________   PHYSICAL EXAM:  VITAL SIGNS: ED Triage Vitals [11/28/17 1542]  Enc Vitals Group     BP (!) 181/93     Pulse Rate 80     Resp 16     Temp 97.6 F (36.4 C)     Temp Source Oral     SpO2 99 %     Weight 142 lb (64.4 kg)     Height 5\' 6"  (1.676 m)     Head Circumference      Peak Flow      Pain Score 9     Pain Loc      Pain Edu?      Excl. in GC?     Constitutional: Alert and oriented. Well  appearing and in no acute distress.  Is able to answer all questions in a normal manner Eyes: Conjunctivae are normal.  Head: Atraumatic. Nose: No congestion/rhinnorhea. Cardiovascular: Normal rate, regular rhythm.  Heart sounds are normal Respiratory: Normal respiratory effort.  No retractions, lungs are clear to auscultation GU: deferred Musculoskeletal: FROM all extremities, warm and well perfused, right middle and fourth finger are swollen and tender, full range of motion, neurovascular is intact Neurologic:  Normal speech and language.  Skin:  Skin is warm, dry and intact. No rash noted.  No bruising noted Psychiatric: Mood and affect are normal. Speech and behavior are normal.  ____________________________________________   LABS (all labs ordered are listed, but only abnormal results are  displayed)  Labs Reviewed - No data to display ____________________________________________   ____________________________________________  RADIOLOGY  X-ray of the right hand is negative ____________________________________________   PROCEDURES  Procedure(s) performed: No      ____________________________________________   INITIAL IMPRESSION / ASSESSMENT AND PLAN / ED COURSE  Pertinent labs & imaging results that were available during my care of the patient were reviewed by me and considered in my medical decision making (see chart for details).  Patient is a 56 year old female with a history of trigger finger, slammed her hand in the screen door today, x-ray is negative, 2 fingers were buddy taped by the tech, she was given a prescription for meloxicam for pain and inflammation, she is to follow-up with Dr. Hyacinth MeekerMiller or her family doctor if she is not better in 5-7 days, patient states she understands and will comply with treatment plan      ____________________________________________   FINAL CLINICAL IMPRESSION(S) / ED DIAGNOSES  Final diagnoses:  Contusion of right hand, initial encounter      NEW MEDICATIONS STARTED DURING THIS VISIT:  This SmartLink is deprecated. Use AVSMEDLIST instead to display the medication list for a patient.   Note:  This document was prepared using Dragon voice recognition software and may include unintentional dictation errors.    Faythe GheeFisher, Susan W, PA 11/28/17 1711    Minna AntisPaduchowski, Kevin, MD 11/28/17 2015

## 2017-11-28 NOTE — Discharge Instructions (Signed)
Follow-up with your regular doctor if you are not better in 5 days, keep your fingers buddy taped for 3-5 days, apply ice to the area, take meloxicam as needed for pain and inflammation, return if you are worsening

## 2018-01-15 ENCOUNTER — Ambulatory Visit: Payer: BLUE CROSS/BLUE SHIELD | Admitting: Nurse Practitioner

## 2018-01-15 ENCOUNTER — Encounter: Payer: Self-pay | Admitting: Nurse Practitioner

## 2018-01-15 VITALS — BP 151/79 | HR 70 | Resp 16 | Ht 66.0 in | Wt 146.2 lb

## 2018-01-15 DIAGNOSIS — B373 Candidiasis of vulva and vagina: Secondary | ICD-10-CM

## 2018-01-15 DIAGNOSIS — I10 Essential (primary) hypertension: Secondary | ICD-10-CM

## 2018-01-15 DIAGNOSIS — E119 Type 2 diabetes mellitus without complications: Secondary | ICD-10-CM

## 2018-01-15 DIAGNOSIS — B3731 Acute candidiasis of vulva and vagina: Secondary | ICD-10-CM

## 2018-01-15 LAB — POCT GLYCOSYLATED HEMOGLOBIN (HGB A1C): Hemoglobin A1C: 6.8

## 2018-01-15 MED ORDER — LOSARTAN POTASSIUM 50 MG PO TABS: 50.0000 mg | ORAL_TABLET | Freq: Every day | ORAL | 3 refills | Status: DC

## 2018-01-15 MED ORDER — GLUCOSE BLOOD VI STRP: ORAL_STRIP | 12 refills | Status: DC

## 2018-01-15 MED ORDER — ONGLYZA 5 MG PO TABS: 5.0000 mg | ORAL_TABLET | Freq: Every day | ORAL | 5 refills | Status: DC

## 2018-01-15 MED ORDER — FLUCONAZOLE 150 MG PO TABS: ORAL_TABLET | ORAL | 0 refills | Status: DC

## 2018-01-15 NOTE — Progress Notes (Addendum)
[facility] [address] [place], [place] [address]  Internal MEDICINE  Office Visit Note  Patient Name: Regina Short  [id number]  [id number]  Date of Service: 01/15/2018  Chief Complaint  Patient presents with  . Follow-up    3 month follow up diabetes  . Diabetes    The patient is here for her routine follow up. She has brought her blood sugar log showing blood sugars between 70 and 197. Only two readings in 3 months over 175. Most of them are between 100 and 150.  She does report white vaginal discharge which is clupy with slight odor. Was treated for mild uti at her last visit and this has been going on since she was on antibiotics. She reports no vaginal ithing or irritation.    Diabetes  She presents for her follow-up diabetic visit. She has type 2 diabetes mellitus. No MedicAlert identification noted. Her disease course has been fluctuating. There are no hypoglycemic associated symptoms. Pertinent negatives for hypoglycemia include no headaches, nervousness/anxiousness or tremors. There are no diabetic associated symptoms. Pertinent negatives for diabetes include no chest pain and no fatigue. There are no hypoglycemic complications. Symptoms are stable. Diabetic complications include peripheral neuropathy. Risk factors for coronary artery disease include hypertension and tobacco exposure. Current diabetic treatment includes oral agent (triple therapy). She is compliant with treatment most of the time. Her weight is stable. She is following a diabetic diet. Meal planning includes ADA exchanges and avoidance of concentrated sweets. There is no change in her home blood glucose trend. Her breakfast blood glucose is taken between 6-7 am. Her overall blood glucose range is 140-180 mg/dl.    Pt is here for routine follow up.    Current Medication: Outpatient Encounter Medications as of 01/15/2018  Medication Sig  . cyclobenzaprine (FLEXERIL) 10 MG tablet Take 1 tablet by  mouth daily.  Marland Kitchen glyBURIDE-metformin (GLUCOVANCE) 5-500 MG per tablet Take 1 tablet by mouth 2 (two) times daily.  Marland Kitchen lisinopril (PRINIVIL,ZESTRIL) 10 MG tablet Take 1 tablet by mouth daily.  . meloxicam (MOBIC) 15 MG tablet Take 1 tablet (15 mg total) by mouth daily.  . ONGLYZA 5 MG TABS tablet Take 1 tablet by mouth daily.   No facility-administered encounter medications on file as of 01/15/2018.     Surgical History: Past Surgical History:  Procedure Laterality Date  . ABDOMINAL HYSTERECTOMY    . ABDOMINAL SURGERY     5 tumors removed  . BREAST BIOPSY Right 02/02/15 core   focal fibroadenomatoid changes and sclerosis    Medical History: Past Medical History:  Diagnosis Date  . Chronic back pain   . Diabetes mellitus without complication (HCC)   . GERD (gastroesophageal reflux disease)   . Hand tendonitis   . Hypertension     Family History: Family History  Problem Relation Age of Onset  . Congestive Heart Failure Unknown   . Breast cancer Maternal Aunt 82    Social History   Socioeconomic History  . Marital status: Single    Spouse name: Not on file  . Number of children: Not on file  . Years of education: Not on file  . Highest education level: Not on file  Social Needs  . Financial resource strain: Not on file  . Food insecurity - worry: Not on file  . Food insecurity - inability: Not on file  . Transportation needs - medical: Not on file  . Transportation needs - non-medical: Not on file  Occupational History  .  Not on file  Tobacco Use  . Smoking status: Current Every Day Smoker    Packs/day: 0.50    Last attempt to quit: 11/20/2014    Years since quitting: 3.1  . Smokeless tobacco: Former Engineer, waterUser  Substance and Sexual Activity  . Alcohol use: No    Alcohol/week: 0.0 oz    Comment: occassional drinks, last drink 6 months ago   . Drug use: No  . Sexual activity: Yes    Birth control/protection: None  Other Topics Concern  . Not on file  Social  History Narrative  . Not on file      Review of Systems  Constitutional: Negative for activity change, chills, fatigue and unexpected weight change.  HENT: Negative for congestion, postnasal drip, rhinorrhea, sneezing and sore throat.   Eyes: Positive for redness and itching.       Treated at walk-in clinic 2 weeks ago for pink eye. Still using antibiotic eye drops as prescribed.   Respiratory: Negative for cough, chest tightness, shortness of breath and wheezing.   Cardiovascular: Negative for chest pain and palpitations.  Gastrointestinal: Negative for abdominal pain, constipation, diarrhea, nausea and vomiting.  Endocrine:       Blood sugars doing well   Genitourinary: Positive for vaginal discharge. Negative for dysuria and frequency.  Musculoskeletal: Negative for arthralgias, back pain, joint swelling and neck pain.  Skin: Negative for rash.  Allergic/Immunologic: Negative for environmental allergies.  Neurological: Negative for tremors, numbness and headaches.  Hematological: Negative for adenopathy. Does not bruise/bleed easily.  Psychiatric/Behavioral: Negative for behavioral problems (Depression), sleep disturbance and suicidal ideas. The patient is not nervous/anxious.     Today's Vitals   01/15/18 0834  BP: (!) 151/79  Pulse: 70  Resp: 16  SpO2: 99%  Weight: 146 lb 3.2 oz (66.3 kg)  Height: 5\' 6"  (1.676 m)     Physical Exam  Constitutional: She is oriented to person, place, and time. She appears well-developed and well-nourished. No distress.  HENT:  Head: Normocephalic and atraumatic.  Mouth/Throat: Oropharynx is clear and moist. No oropharyngeal exudate.  Eyes: EOM are normal. Pupils are equal, round, and reactive to light.  Slight redness to bilateral conjunctiva. No excess tearing or drainage present.   Neck: Normal range of motion. Neck supple. No JVD present. No tracheal deviation present. No thyromegaly present.  Cardiovascular: Normal rate, regular  rhythm and normal heart sounds. Exam reveals no gallop and no friction rub.  No murmur heard. Pulmonary/Chest: Effort normal and breath sounds normal. No respiratory distress. She has no wheezes. She has no rales. She exhibits no tenderness.  Abdominal: Soft. Bowel sounds are normal. There is no tenderness.  Musculoskeletal: Normal range of motion.  Lymphadenopathy:    She has no cervical adenopathy.  Neurological: She is alert and oriented to person, place, and time. No cranial nerve deficit.  Skin: Skin is warm and dry. She is not diaphoretic.  Psychiatric: She has a normal mood and affect. Her behavior is normal. Judgment and thought content normal.  Nursing note and vitals reviewed.  Assessment/Plan:  1. Diabetes mellitus without complication (HCC) - POCT HgB A1C - 6.8 today. Continue diabetic meds as prescribed. Continue to follow ADA diet. New Accu-check guide glucose monitor provided.  - ONGLYZA 5 MG TABS tablet; Take 1 tablet (5 mg total) by mouth daily.  Dispense: 30 tablet; Refill: 5 - glucose blood (ACCU-CHEK GUIDE) test strip; Blood sugar testing QAM and prn  Dispense: 100 each; Refill: 12  2. Vaginal  candidiasis - fluconazole (DIFLUCAN) 150 MG tablet; Take 1 tablet po every other day for total of 5 doses  Dispense: 5 tablet; Refill: 0  3. Essential hypertension Generally stable, however, patient has not taken bp medication today. Continue losartan as prescribed.  - losartan (COZAAR) 50 MG tablet; Take 1 tablet (50 mg total) by mouth daily.  Dispense: 90 tablet; Refill: 3  She should follow up in 3 months and sooner if needed   General Counseling: Regina Short verbalizes understanding of the findings of todays visit and agrees with plan of treatment. I have discussed any further diagnostic evaluation that may be needed or ordered today. We also reviewed her medications today. she has been encouraged to call the office with any questions or concerns that should arise related to todays  visit.  Diabetes Counseling:  1. Addition of ACE inh/ ARB'S for nephroprotection. 2. Diabetic foot care, prevention of complications.  3.Exercise and lose weight.  4. Diabetic eye examination, 5. Monitor blood sugar closlely. nutrition counseling.  6.Sign and symptoms of hypoglycemia including shaking sweating,confusion and headaches.     Orders Placed This Encounter  Procedures  . POCT HgB A1C    This patient was seen by Vincent Gros, FNP- C in Collaboration with Dr Lyndon Code as a part of collaborative care agreement  Time spent: 20 Minutes     Dr Lyndon Code Internal medicine

## 2018-01-22 ENCOUNTER — Other Ambulatory Visit: Payer: Self-pay | Admitting: Nurse Practitioner

## 2018-01-22 ENCOUNTER — Other Ambulatory Visit: Payer: Self-pay

## 2018-01-22 DIAGNOSIS — E1165 Type 2 diabetes mellitus with hyperglycemia: Secondary | ICD-10-CM

## 2018-01-22 MED ORDER — GLYBURIDE-METFORMIN 5-500 MG PO TABS: 1.0000 | ORAL_TABLET | Freq: Two times a day (BID) | ORAL | 3 refills | Status: DC

## 2018-02-26 ENCOUNTER — Other Ambulatory Visit: Payer: Self-pay | Admitting: Nurse Practitioner

## 2018-02-26 DIAGNOSIS — I10 Essential (primary) hypertension: Secondary | ICD-10-CM

## 2018-02-26 MED ORDER — OLMESARTAN MEDOXOMIL 20 MG PO TABS
20.0000 mg | ORAL_TABLET | Freq: Every day | ORAL | 3 refills | Status: DC
Start: 2018-02-26 — End: 2018-03-12

## 2018-02-26 NOTE — Progress Notes (Signed)
D/c losartan due to recall. Start olmesartan 20mg  daily. New rx sent to her pharmacy.

## 2018-03-12 ENCOUNTER — Telehealth: Payer: Self-pay

## 2018-03-12 ENCOUNTER — Other Ambulatory Visit: Payer: Self-pay | Admitting: Nurse Practitioner

## 2018-03-12 ENCOUNTER — Other Ambulatory Visit: Payer: Self-pay

## 2018-03-12 DIAGNOSIS — I10 Essential (primary) hypertension: Secondary | ICD-10-CM

## 2018-03-12 MED ORDER — OLMESARTAN MEDOXOMIL 20 MG PO TABS: 20.0000 mg | ORAL_TABLET | Freq: Every day | ORAL | 3 refills | Status: DC

## 2018-03-12 MED ORDER — TELMISARTAN 20 MG PO TABS: 20.0000 mg | ORAL_TABLET | Freq: Every day | ORAL | 3 refills | Status: DC

## 2018-03-12 NOTE — Progress Notes (Signed)
D/c olmesartan due to national back order. Change to telmisartan 20mg daily and sent to [facility].

## 2018-03-12 NOTE — Telephone Encounter (Signed)
Called pt to let her know that her telmisartan was sent to [facility].

## 2018-03-12 NOTE — Telephone Encounter (Signed)
I called pt to let her know the olmesartan that was called in to replace her past bp med. Is also on back order and once there is another med. We will call her back.

## 2018-03-12 NOTE — Telephone Encounter (Signed)
D/c olmesartan due to national back order. Change to telmisartan 20mg  daily and sent to [facility].

## 2018-03-15 ENCOUNTER — Other Ambulatory Visit: Payer: Self-pay | Admitting: Nurse Practitioner

## 2018-03-15 ENCOUNTER — Telehealth: Payer: Self-pay

## 2018-03-15 DIAGNOSIS — I10 Essential (primary) hypertension: Secondary | ICD-10-CM

## 2018-03-15 MED ORDER — LOSARTAN POTASSIUM 50 MG PO TABS: 50.0000 mg | ORAL_TABLET | Freq: Every day | ORAL | 3 refills | Status: DC

## 2018-03-15 NOTE — Telephone Encounter (Signed)
Called [facility] pharmacy and they said that they still have losartan available. She just switched pharmacies from [facility] to [facility] so that may be why you have had to switch so many times, but when I called [facility] the pharmacist still had losartan available and he said only certain lots were recalled.

## 2018-03-15 NOTE — Telephone Encounter (Signed)
Sent losartan 50mg  to [facility]

## 2018-03-15 NOTE — Progress Notes (Signed)
Sent losartan 50mg to [facility]

## 2018-03-15 NOTE — Telephone Encounter (Signed)
Can you ask the pharmacy what other providers are doing? I have switched this patient from losartan, to olmesartan, to lisinopril, and telmisartan. All on backorder or recall. She is a diabetic patient, so should be on ACE or ARM. The ACE causes negative side effects and every ARM is on recall or now backordered. Please ask what other providers are doing? thanks

## 2018-03-19 ENCOUNTER — Telehealth: Payer: Self-pay

## 2018-03-19 NOTE — Telephone Encounter (Signed)
On 3/25 I was notified that she changed pharmacies and could go back to losartan at regular dose because of the negative side effect to the temisartan. This has already been taken care of. I sent the rx for losartan to her new pharmacy 3/25

## 2018-03-23 NOTE — Telephone Encounter (Signed)
Left vm that pt can go back on losartan and that new rx sent to new pharmacy.  dbs

## 2018-04-02 ENCOUNTER — Encounter: Payer: Self-pay | Admitting: Nurse Practitioner

## 2018-04-02 ENCOUNTER — Ambulatory Visit: Payer: BLUE CROSS/BLUE SHIELD | Admitting: Nurse Practitioner

## 2018-04-02 VITALS — BP 143/94 | HR 78 | Resp 16 | Ht 66.0 in | Wt 147.6 lb

## 2018-04-02 DIAGNOSIS — Z1231 Encounter for screening mammogram for malignant neoplasm of breast: Secondary | ICD-10-CM | POA: Diagnosis not present

## 2018-04-02 DIAGNOSIS — E1165 Type 2 diabetes mellitus with hyperglycemia: Secondary | ICD-10-CM | POA: Insufficient documentation

## 2018-04-02 DIAGNOSIS — Z0001 Encounter for general adult medical examination with abnormal findings: Secondary | ICD-10-CM | POA: Insufficient documentation

## 2018-04-02 DIAGNOSIS — Z1239 Encounter for other screening for malignant neoplasm of breast: Secondary | ICD-10-CM

## 2018-04-02 DIAGNOSIS — E119 Type 2 diabetes mellitus without complications: Secondary | ICD-10-CM

## 2018-04-02 DIAGNOSIS — I10 Essential (primary) hypertension: Secondary | ICD-10-CM | POA: Diagnosis not present

## 2018-04-02 LAB — POCT GLYCOSYLATED HEMOGLOBIN (HGB A1C): HEMOGLOBIN A1C: 6.5

## 2018-04-02 MED ORDER — ONGLYZA 5 MG PO TABS: 5.0000 mg | ORAL_TABLET | Freq: Every day | ORAL | 5 refills | Status: DC

## 2018-04-02 MED ORDER — GLYBURIDE-METFORMIN 5-500 MG PO TABS: 1.0000 | ORAL_TABLET | Freq: Two times a day (BID) | ORAL | 3 refills | Status: DC

## 2018-04-02 MED ORDER — AMLODIPINE BESYLATE 5 MG PO TABS: 5.0000 mg | ORAL_TABLET | Freq: Every day | ORAL | 3 refills | Status: DC

## 2018-04-02 NOTE — Progress Notes (Signed)
[facility] [address] [place], [place] [address]  Internal MEDICINE  Office Visit Note  Patient Name: Regina JamesWanda W Short  [id number]-[id number]  [id number]  Date of Service: 04/02/2018  Chief Complaint  Patient presents with  . Hypertension  . Diabetes    The patient is here for routine follow up exam. Blood pressure medication was changed at her last visit. Changed to losartan, but then was recalled. We have tried several different ARBs. These did cause her to have negative side effects. Blood pressure is slightly elevated today, but she admits she has not taken the originally prescribed losartan.  Blood sugars have remained well controlled. Blood sugars are generally 70-100. She did have the highest reading she had over last three months was 167.    Pt is here for routine follow up.    Current Medication: Outpatient Encounter Medications as of 04/02/2018  Medication Sig  . glyBURIDE-metformin (GLUCOVANCE) 5-500 MG tablet Take 1 tablet by mouth 2 (two) times daily.  . ONGLYZA 5 MG TABS tablet Take 1 tablet (5 mg total) by mouth daily.  . [DISCONTINUED] glyBURIDE-metformin (GLUCOVANCE) 5-500 MG tablet Take 1 tablet by mouth 2 (two) times daily.  . [DISCONTINUED] ONGLYZA 5 MG TABS tablet Take 1 tablet (5 mg total) by mouth daily.  Marland Kitchen. amLODipine (NORVASC) 5 MG tablet Take 1 tablet (5 mg total) by mouth daily.  . fluconazole (DIFLUCAN) 150 MG tablet Take 1 tablet po every other day for total of 5 doses  . glucose blood (ACCU-CHEK GUIDE) test strip Blood sugar testing QAM and prn  . losartan (COZAAR) 50 MG tablet Take 1 tablet (50 mg total) by mouth daily. (Patient not taking: Reported on 04/02/2018)  . [DISCONTINUED] cyclobenzaprine (FLEXERIL) 10 MG tablet Take 1 tablet by mouth daily.  . [DISCONTINUED] meloxicam (MOBIC) 15 MG tablet Take 1 tablet (15 mg total) by mouth daily. (Patient not taking: Reported on 04/02/2018)  . [DISCONTINUED] telmisartan (MICARDIS) 20 MG tablet Take 1 tablet  (20 mg total) by mouth daily.   No facility-administered encounter medications on file as of 04/02/2018.     Surgical History: Past Surgical History:  Procedure Laterality Date  . ABDOMINAL HYSTERECTOMY    . ABDOMINAL SURGERY     5 tumors removed  . BREAST BIOPSY Right 02/02/15 core   focal fibroadenomatoid changes and sclerosis    Medical History: Past Medical History:  Diagnosis Date  . Chronic back pain   . Diabetes mellitus without complication (HCC)   . GERD (gastroesophageal reflux disease)   . Hand tendonitis   . Hypertension     Family History: Family History  Problem Relation Age of Onset  . Congestive Heart Failure Unknown   . Breast cancer Maternal Aunt [id number]    Social History   Socioeconomic History  . Marital status: Single    Spouse name: Not on file  . Number of children: Not on file  . Years of education: Not on file  . Highest education level: Not on file  Occupational History  . Not on file  Social Needs  . Financial resource strain: Not on file  . Food insecurity:    Worry: Not on file    Inability: Not on file  . Transportation needs:    Medical: Not on file    Non-medical: Not on file  Tobacco Use  . Smoking status: Current Every Day Smoker    Packs/day: 0.50    Last attempt to quit: 11/20/2014    Years since  quitting: 3.3  . Smokeless tobacco: Former Engineer, water and Sexual Activity  . Alcohol use: No    Alcohol/week: 0.0 oz    Comment: occassional drinks, last drink 6 months ago   . Drug use: No  . Sexual activity: Yes    Birth control/protection: None  Lifestyle  . Physical activity:    Days per week: Not on file    Minutes per session: Not on file  . Stress: Not on file  Relationships  . Social connections:    Talks on phone: Not on file    Gets together: Not on file    Attends religious service: Not on file    Active member of club or organization: Not on file    Attends meetings of clubs or organizations: Not on file     Relationship status: Not on file  . Intimate partner violence:    Fear of current or ex partner: Not on file    Emotionally abused: Not on file    Physically abused: Not on file    Forced sexual activity: Not on file  Other Topics Concern  . Not on file  Social History Narrative  . Not on file      Review of Systems  Constitutional: Negative for activity change, chills, fatigue and unexpected weight change.  HENT: Negative for congestion, postnasal drip, rhinorrhea, sneezing and sore throat.   Eyes: Negative for redness and itching.       Treated at walk-in clinic 2 weeks ago for pink eye. Still using antibiotic eye drops as prescribed.   Respiratory: Negative for cough, chest tightness, shortness of breath and wheezing.   Cardiovascular: Negative for chest pain and palpitations.       Blood pressure elevated today.   Gastrointestinal: Negative for abdominal pain, constipation, diarrhea, nausea and vomiting.  Endocrine:       Blood sugars doing well   Genitourinary: Positive for vaginal discharge. Negative for dysuria and frequency.  Musculoskeletal: Negative for arthralgias, back pain, joint swelling and neck pain.  Skin: Negative for rash.  Allergic/Immunologic: Negative for environmental allergies.  Neurological: Negative for tremors, numbness and headaches.  Hematological: Negative for adenopathy. Does not bruise/bleed easily.  Psychiatric/Behavioral: Negative for behavioral problems (Depression), sleep disturbance and suicidal ideas. The patient is not nervous/anxious.    Today's Vitals   04/02/18 0854  BP: (!) 143/94  Pulse: 78  Resp: 16  SpO2: 97%  Weight: 147 lb 9.6 oz (67 kg)  Height: 5\' 6"  (1.676 m)    Physical Exam  Constitutional: She is oriented to person, place, and time. She appears well-developed and well-nourished. No distress.  HENT:  Head: Normocephalic and atraumatic.  Mouth/Throat: Oropharynx is clear and moist. No oropharyngeal exudate.  Eyes:  Pupils are equal, round, and reactive to light. EOM are normal.  Slight redness to bilateral conjunctiva. No excess tearing or drainage present.   Neck: Normal range of motion. Neck supple. No JVD present. Carotid bruit is not present. No tracheal deviation present. No thyromegaly present.  Cardiovascular: Normal rate, regular rhythm and normal heart sounds. Exam reveals no gallop and no friction rub.  No murmur heard. Pulmonary/Chest: Effort normal and breath sounds normal. No respiratory distress. She has no wheezes. She has no rales. She exhibits no tenderness.  Abdominal: Soft. Bowel sounds are normal. There is no tenderness.  Musculoskeletal: Normal range of motion.  Lymphadenopathy:    She has no cervical adenopathy.  Neurological: She is alert and oriented to person,  place, and time. No cranial nerve deficit.  Skin: Skin is warm and dry. She is not diaphoretic.  Psychiatric: She has a normal mood and affect. Her behavior is normal. Judgment and thought content normal.  Nursing note and vitals reviewed.   Assessment/Plan: 1. Uncontrolled type 2 diabetes mellitus with hyperglycemia (HCC) - POCT HgB A1C 6.5 today. Continue diabetic medications as prescribed. Refills provided today  2. Essential hypertension C/c ARBs and ACE due to negative side effects experienced. Start amlodipine 5mg  daily.  - amLODipine (NORVASC) 5 MG tablet; Take 1 tablet (5 mg total) by mouth daily.  Dispense: 30 tablet; Refill: 3  3. Type 2 diabetes mellitus with hyperglycemia, without long-term current use of insulin (HCC) - glyBURIDE-metformin (GLUCOVANCE) 5-500 MG tablet; Take 1 tablet by mouth 2 (two) times daily.  Dispense: 60 tablet; Refill: 3  4. Diabetes mellitus without complication (HCC) - ONGLYZA 5 MG TABS tablet; Take 1 tablet (5 mg total) by mouth daily.  Dispense: 30 tablet; Refill: 5  5. Screening for breast cancer - MM DIGITAL SCREENING BILATERAL; Future - should be scheduled on or after  05/16/2018  General Counseling: Bonney Roussel understanding of the findings of todays visit and agrees with plan of treatment. I have discussed any further diagnostic evaluation that may be needed or ordered today. We also reviewed her medications today. she has been encouraged to call the office with any questions or concerns that should arise related to todays visit.   Hypertension Counseling:   The following hypertensive lifestyle modification were recommended and discussed:  1. Limiting alcohol intake to less than 1 oz/day of ethanol:(24 oz of beer or 8 oz of wine or 2 oz of 100-proof whiskey). 2. Take baby ASA 81 mg daily. 3. Importance of regular aerobic exercise and losing weight. 4. Reduce dietary saturated fat and cholesterol intake for overall cardiovascular health. 5. Maintaining adequate dietary potassium, calcium, and magnesium intake. 6. Regular monitoring of the blood pressure. 7. Reduce sodium intake to less than 100 mmol/day (less than 2.3 gm of sodium or less than 6 gm of sodium choride)     Orders Placed This Encounter  Procedures  . MM DIGITAL SCREENING BILATERAL  . POCT HgB A1C    Meds ordered this encounter  Medications  . amLODipine (NORVASC) 5 MG tablet    Sig: Take 1 tablet (5 mg total) by mouth daily.    Dispense:  30 tablet    Refill:  3    Order Specific Question:   Supervising Provider    Answer:   Lyndon Code [[id number]]  . glyBURIDE-metformin (GLUCOVANCE) 5-500 MG tablet    Sig: Take 1 tablet by mouth 2 (two) times daily.    Dispense:  60 tablet    Refill:  3    Order Specific Question:   Supervising Provider    Answer:   Lyndon Code [[id number]]  . ONGLYZA 5 MG TABS tablet    Sig: Take 1 tablet (5 mg total) by mouth daily.    Dispense:  30 tablet    Refill:  5    Order Specific Question:   Supervising Provider    Answer:   Lyndon Code [[id number]]    Time spent: 85 Minutes    Dr Lyndon Code Internal medicine

## 2018-05-28 ENCOUNTER — Ambulatory Visit
Admission: RE | Admit: 2018-05-28 | Discharge: 2018-05-28 | Disposition: A | Discharge status: Home / Self Care | Payer: BLUE CROSS/BLUE SHIELD | Source: Ambulatory Visit | Attending: Nurse Practitioner | Admitting: Nurse Practitioner

## 2018-05-28 DIAGNOSIS — Z1239 Encounter for other screening for malignant neoplasm of breast: Secondary | ICD-10-CM

## 2018-05-28 DIAGNOSIS — Z1231 Encounter for screening mammogram for malignant neoplasm of breast: Secondary | ICD-10-CM | POA: Insufficient documentation

## 2018-07-09 ENCOUNTER — Ambulatory Visit (INDEPENDENT_AMBULATORY_CARE_PROVIDER_SITE_OTHER): Payer: BLUE CROSS/BLUE SHIELD | Admitting: Nurse Practitioner

## 2018-07-09 ENCOUNTER — Encounter: Payer: Self-pay | Admitting: Nurse Practitioner

## 2018-07-09 VITALS — BP 130/86 | HR 74 | Resp 16 | Ht 66.0 in | Wt 149.2 lb

## 2018-07-09 DIAGNOSIS — I10 Essential (primary) hypertension: Secondary | ICD-10-CM | POA: Diagnosis not present

## 2018-07-09 DIAGNOSIS — M6530 Trigger finger, unspecified finger: Secondary | ICD-10-CM | POA: Diagnosis not present

## 2018-07-09 DIAGNOSIS — R300 Dysuria: Secondary | ICD-10-CM

## 2018-07-09 DIAGNOSIS — L600 Ingrowing nail: Secondary | ICD-10-CM

## 2018-07-09 DIAGNOSIS — Z0001 Encounter for general adult medical examination with abnormal findings: Secondary | ICD-10-CM | POA: Diagnosis not present

## 2018-07-09 DIAGNOSIS — E1165 Type 2 diabetes mellitus with hyperglycemia: Secondary | ICD-10-CM | POA: Diagnosis not present

## 2018-07-09 LAB — POCT GLYCOSYLATED HEMOGLOBIN (HGB A1C): Hemoglobin A1C: 6.6 % — AB (ref 4.0–5.6)

## 2018-07-09 NOTE — Progress Notes (Signed)
[facility] [address] [place], [place] [address]  Internal MEDICINE  Office Visit Note  Patient Name: Regina Short  [id number]  [id number]  Date of Service: 07/09/2018   Pt is here for routine health maintenance examination   Chief Complaint  Patient presents with  . Annual Exam  . Diabetes    3 month follow up  . Hand Pain    right 4th finger and right hand.  . Leg Pain    lower aspect of both legs. get darker in color and sore .     The patient is having had pain in regard to the ring finger of her right hand. Will get "stuck" in bent position and she will have to force it to straighten with her other hand. The knuckle is sore and swollen. She has had this happen in the past. Has seen orthopedics and had injections into her finger. This has helped, but only lasts for a short period of time . She also c/o ingrown toenails of both great toes. These are causing a great deal of pain in both feet, especially when wearing closed toed shoes.   Diabetes  She presents for her follow-up diabetic visit. She has gestational diabetes mellitus. No MedicAlert identification noted. Her disease course has been fluctuating. Hypoglycemia symptoms include sweats. Pertinent negatives for hypoglycemia include no dizziness, headaches, nervousness/anxiousness or tremors. Pertinent negatives for diabetes include no chest pain, no fatigue, no polydipsia, no polyphagia and no polyuria. There are no hypoglycemic complications. Symptoms are stable. Risk factors for coronary artery disease include dyslipidemia, post-menopausal, stress and diabetes mellitus. Current diabetic treatment includes oral agent (triple therapy). She is compliant with treatment most of the time. She is following a generally healthy diet. Meal planning includes avoidance of concentrated sweets. She has not had a previous visit with a dietitian. She participates in exercise intermittently. There is no change in her home  blood glucose trend. An ACE inhibitor/angiotensin II receptor blocker is being taken. She does not see a podiatrist.Eye exam is current.    Current Medication: Outpatient Encounter Medications as of 07/09/2018  Medication Sig  . amLODipine (NORVASC) 5 MG tablet Take 1 tablet (5 mg total) by mouth daily.  Marland Kitchen glucose blood (ACCU-CHEK GUIDE) test strip Blood sugar testing QAM and prn  . glyBURIDE-metformin (GLUCOVANCE) 5-500 MG tablet Take 1 tablet by mouth 2 (two) times daily.  . ONGLYZA 5 MG TABS tablet Take 1 tablet (5 mg total) by mouth daily.  . fluconazole (DIFLUCAN) 150 MG tablet Take 1 tablet po every other day for total of 5 doses (Patient not taking: Reported on 07/09/2018)  . losartan (COZAAR) 50 MG tablet Take 1 tablet (50 mg total) by mouth daily. (Patient not taking: Reported on 04/02/2018)   No facility-administered encounter medications on file as of 07/09/2018.     Surgical History: Past Surgical History:  Procedure Laterality Date  . ABDOMINAL HYSTERECTOMY    . ABDOMINAL SURGERY     5 tumors removed  . BREAST BIOPSY Right 02/02/15 core   focal fibroadenomatoid changes and sclerosis    Medical History: Past Medical History:  Diagnosis Date  . Chronic back pain   . Diabetes mellitus without complication (HCC)   . GERD (gastroesophageal reflux disease)   . Hand tendonitis   . Hypertension     Family History: Family History  Problem Relation Age of Onset  . Congestive Heart Failure Unknown   . Breast cancer Maternal Aunt 60  Review of Systems  Constitutional: Negative for activity change, chills, fatigue and unexpected weight change.  HENT: Negative for congestion, postnasal drip, rhinorrhea, sneezing and sore throat.   Eyes: Negative.  Negative for redness and itching.  Respiratory: Negative for cough, chest tightness, shortness of breath and wheezing.   Cardiovascular: Negative for chest pain and palpitations.          Gastrointestinal: Negative for  abdominal pain, constipation, diarrhea, nausea and vomiting.  Endocrine: Negative for cold intolerance, heat intolerance, polydipsia, polyphagia and polyuria.       Blood sugars doing well   Genitourinary: Negative for dysuria, frequency, urgency and vaginal discharge.  Musculoskeletal: Positive for arthralgias and myalgias. Negative for back pain, joint swelling and neck pain.  Skin: Negative for rash.  Allergic/Immunologic: Negative for environmental allergies.  Neurological: Negative for dizziness, tremors, numbness and headaches.  Hematological: Negative for adenopathy. Does not bruise/bleed easily.  Psychiatric/Behavioral: Negative for behavioral problems (Depression), sleep disturbance and suicidal ideas. The patient is not nervous/anxious.      Today's Vitals   07/09/18 0837  BP: 130/86  Pulse: 74  Resp: 16  SpO2: 99%  Weight: 149 lb 3.2 oz (67.7 kg)  Height: 5\' 6"  (1.676 m)     Physical Exam  Constitutional: She is oriented to person, place, and time. She appears well-developed and well-nourished. No distress.  HENT:  Head: Normocephalic and atraumatic.  Nose: Nose normal.  Mouth/Throat: Oropharynx is clear and moist. No oropharyngeal exudate.  Eyes: Pupils are equal, round, and reactive to light. Conjunctivae and EOM are normal.  Neck: Normal range of motion. Neck supple. No JVD present. Carotid bruit is not present. No tracheal deviation present. No thyromegaly present.  Cardiovascular: Normal rate, regular rhythm, normal heart sounds and intact distal pulses. Exam reveals no gallop and no friction rub.  No murmur heard. Pulses:      Dorsalis pedis pulses are 2+ on the right side, and 2+ on the left side.       Posterior tibial pulses are 2+ on the right side, and 2+ on the left side.  Pulmonary/Chest: Effort normal and breath sounds normal. No respiratory distress. She has no wheezes. She has no rales. She exhibits no tenderness.  Abdominal: Soft. Bowel sounds are  normal. There is no tenderness.  Musculoskeletal: Normal range of motion.  4th finger of right hand is tender with palpation of medial joint. Currently wrapped in tape to provide support, ROM is limited.   Feet:  Right Foot:  Protective Sensation: 10 sites tested. 10 sites sensed.  Left Foot:  Protective Sensation: 10 sites tested. 10 sites sensed.  Lymphadenopathy:    She has no cervical adenopathy.  Neurological: She is alert and oriented to person, place, and time. No cranial nerve deficit.  Skin: Skin is warm and dry. Capillary refill takes less than 2 seconds. She is not diaphoretic.  Ingrown toenails bilateral great toes  Psychiatric: She has a normal mood and affect. Her behavior is normal. Judgment and thought content normal.  Nursing note and vitals reviewed.    LABS: Recent Results (from the past 2160 hour(s))  POCT HgB A1C     Status: Abnormal   Collection Time: 07/09/18  8:54 AM  Result Value Ref Range   Hemoglobin A1C 6.6 (A) 4.0 - 5.6 %   HbA1c POC (<> result, manual entry)  4.0 - 5.6 %   HbA1c, POC (prediabetic range)  5.7 - 6.4 %   HbA1c, POC (controlled diabetic range)  0.0 - 7.0 %    Assessment/Plan: 1. Encounter for general adult medical examination with abnormal findings Annual health maintenance exam today.   2. Uncontrolled type 2 diabetes mellitus with hyperglycemia (HCC) - POCT HgB A1C 6.6 today. Continue diabetic medication as prescribed. Recommend adherence to ADA diet. Refer to podiatry for foot care and ingrown toenails - Ambulatory referral to Podiatry  3. Acquired trigger finger Refer to orthopedic provider for further evaluation and treatment.  - Ambulatory referral to Orthopedic Surgery  4. Ingrown toenail without infection Refer to podiatry for treatment.  - Ambulatory referral to Podiatry  5. Essential hypertension Stable. Continue bp medication as prescribed.   6. Dysuria - UA/M w/rflx Culture, Routine   General Counseling: Regina Short  verbalizes understanding of the findings of todays visit and agrees with plan of treatment. I have discussed any further diagnostic evaluation that may be needed or ordered today. We also reviewed her medications today. she has been encouraged to call the office with any questions or concerns that should arise related to todays visit.    Counseling:  Diabetes Counseling:  1. Addition of ACE inh/ ARB'S for nephroprotection. Microalbumin is updated  2. Diabetic foot care, prevention of complications. Podiatry consult 3. Exercise and lose weight.  4. Diabetic eye examination, Diabetic eye exam is updated  5. Monitor blood sugar closlely. nutrition counseling.  6. Sign and symptoms of hypoglycemia including shaking sweating,confusion and headaches.  This patient was seen by Vincent GrosHeather Leianne Callins FNP Collaboration with Dr Lyndon CodeFozia M Khan as a part of collaborative care agreement  Orders Placed This Encounter  Procedures  . UA/M w/rflx Culture, Routine  . Ambulatory referral to Orthopedic Surgery  . Ambulatory referral to Podiatry  . POCT HgB A1C      Time spent: 7630 Minutes      Lyndon CodeFozia M Khan, MD  Internal Medicine

## 2018-07-10 LAB — SPECIMEN STATUS REPORT

## 2018-07-15 LAB — MICROSCOPIC EXAMINATION: Casts: NONE SEEN /lpf

## 2018-07-15 LAB — UA/M W/RFLX CULTURE, ROUTINE
BILIRUBIN UA: NEGATIVE
GLUCOSE, UA: NEGATIVE
KETONES UA: NEGATIVE
Nitrite, UA: NEGATIVE
PROTEIN UA: NEGATIVE
RBC, UA: NEGATIVE
SPEC GRAV UA: 1.025 (ref 1.005–1.030)
UUROB: 0.2 mg/dL (ref 0.2–1.0)
pH, UA: 5.5 (ref 5.0–7.5)

## 2018-07-15 LAB — URINE CULTURE, REFLEX

## 2018-07-16 DIAGNOSIS — M1710 Unilateral primary osteoarthritis, unspecified knee: Secondary | ICD-10-CM | POA: Insufficient documentation

## 2018-07-16 DIAGNOSIS — M179 Osteoarthritis of knee, unspecified: Secondary | ICD-10-CM | POA: Insufficient documentation

## 2018-07-23 ENCOUNTER — Other Ambulatory Visit: Payer: Self-pay | Admitting: Nurse Practitioner

## 2018-07-24 LAB — COMPREHENSIVE METABOLIC PANEL
A/G RATIO: 2 (ref 1.2–2.2)
ALBUMIN: 4.2 g/dL (ref 3.5–5.5)
ALT: 14 IU/L (ref 0–32)
AST: 14 IU/L (ref 0–40)
Alkaline Phosphatase: 114 IU/L (ref 39–117)
BUN/Creatinine Ratio: 25 — ABNORMAL HIGH (ref 9–23)
BUN: 15 mg/dL (ref 6–24)
Bilirubin Total: 0.3 mg/dL (ref 0.0–1.2)
CO2: 24 mmol/L (ref 20–29)
CREATININE: 0.59 mg/dL (ref 0.57–1.00)
Calcium: 9.7 mg/dL (ref 8.7–10.2)
Chloride: 102 mmol/L (ref 96–106)
GFR calc non Af Amer: 102 mL/min/{1.73_m2} (ref >59)
GFR, EST AFRICAN AMERICAN: 118 mL/min/{1.73_m2} (ref >59)
GLOBULIN, TOTAL: 2.1 g/dL (ref 1.5–4.5)
Glucose: 151 mg/dL — ABNORMAL HIGH (ref 65–99)
POTASSIUM: 4.6 mmol/L (ref 3.5–5.2)
SODIUM: 141 mmol/L (ref 134–144)
TOTAL PROTEIN: 6.3 g/dL (ref 6.0–8.5)

## 2018-07-24 LAB — CBC
Hematocrit: 39.1 % (ref 34.0–46.6)
Hemoglobin: 12.5 g/dL (ref 11.1–15.9)
MCH: 25.2 pg — AB (ref 26.6–33.0)
MCHC: 32 g/dL (ref 31.5–35.7)
MCV: 79 fL (ref 79–97)
PLATELETS: 233 10*3/uL (ref 150–450)
RBC: 4.97 x10E6/uL (ref 3.77–5.28)
RDW: 16.2 % — ABNORMAL HIGH (ref 12.3–15.4)
WBC: 6.6 10*3/uL (ref 3.4–10.8)

## 2018-07-24 LAB — T4, FREE: Free T4: 1.35 ng/dL (ref 0.82–1.77)

## 2018-07-24 LAB — LIPID PANEL W/O CHOL/HDL RATIO
CHOLESTEROL TOTAL: 190 mg/dL (ref 100–199)
HDL: 86 mg/dL (ref >39)
LDL CALC: 88 mg/dL (ref 0–99)
Triglycerides: 82 mg/dL (ref 0–149)
VLDL Cholesterol Cal: 16 mg/dL (ref 5–40)

## 2018-07-24 LAB — TSH: TSH: 1.61 u[IU]/mL (ref 0.450–4.500)

## 2018-07-24 LAB — VITAMIN D 25 HYDROXY (VIT D DEFICIENCY, FRACTURES): VIT D 25 HYDROXY: 13.5 ng/mL — AB (ref 30.0–100.0)

## 2018-07-26 ENCOUNTER — Other Ambulatory Visit: Payer: Self-pay | Admitting: Nurse Practitioner

## 2018-07-26 ENCOUNTER — Telehealth: Payer: Self-pay | Admitting: Nurse Practitioner

## 2018-07-26 DIAGNOSIS — E559 Vitamin D deficiency, unspecified: Secondary | ICD-10-CM

## 2018-07-26 MED ORDER — ERGOCALCIFEROL 1.25 MG (50000 UT) PO CAPS: 50000.0000 [IU] | ORAL_CAPSULE | ORAL | 5 refills | Status: DC

## 2018-07-26 NOTE — Telephone Encounter (Signed)
[facility]

## 2018-07-26 NOTE — Telephone Encounter (Signed)
-----   Message from Carlean JewsHeather E Boscia, NP sent at 07/26/2018  7:23 AM EDT ----- Please let the patient know that her labs looked pretty good. Vitamin d was low. I Added drisdol [id number] iu weekly for next 6 months. Sent to pharmacy. Otherwise, looked good. Thanks.

## 2018-07-26 NOTE — Progress Notes (Signed)
Added drisdol [id number] iu weekly for next 6 months. Sent to pharmacy.

## 2018-08-13 ENCOUNTER — Other Ambulatory Visit: Payer: Self-pay

## 2018-08-13 DIAGNOSIS — I10 Essential (primary) hypertension: Secondary | ICD-10-CM

## 2018-08-13 DIAGNOSIS — E119 Type 2 diabetes mellitus without complications: Secondary | ICD-10-CM

## 2018-08-13 MED ORDER — ONGLYZA 5 MG PO TABS: 5.0000 mg | ORAL_TABLET | Freq: Every day | ORAL | 5 refills | Status: DC

## 2018-08-13 MED ORDER — AMLODIPINE BESYLATE 5 MG PO TABS: 5.0000 mg | ORAL_TABLET | Freq: Every day | ORAL | 3 refills | Status: DC

## 2018-11-12 ENCOUNTER — Ambulatory Visit (INDEPENDENT_AMBULATORY_CARE_PROVIDER_SITE_OTHER): Payer: BLUE CROSS/BLUE SHIELD | Admitting: Nurse Practitioner

## 2018-11-12 ENCOUNTER — Encounter: Payer: Self-pay | Admitting: Nurse Practitioner

## 2018-11-12 VITALS — BP 142/86 | HR 68 | Resp 16 | Ht 66.0 in | Wt 150.0 lb

## 2018-11-12 DIAGNOSIS — E119 Type 2 diabetes mellitus without complications: Secondary | ICD-10-CM

## 2018-11-12 DIAGNOSIS — E1165 Type 2 diabetes mellitus with hyperglycemia: Secondary | ICD-10-CM

## 2018-11-12 DIAGNOSIS — I10 Essential (primary) hypertension: Secondary | ICD-10-CM | POA: Diagnosis not present

## 2018-11-12 DIAGNOSIS — E559 Vitamin D deficiency, unspecified: Secondary | ICD-10-CM

## 2018-11-12 LAB — POCT GLYCOSYLATED HEMOGLOBIN (HGB A1C): Hemoglobin A1C: 8.1 % — AB (ref 4.0–5.6)

## 2018-11-12 MED ORDER — GLYBURIDE-METFORMIN 5-500 MG PO TABS: 1.5000 | ORAL_TABLET | Freq: Two times a day (BID) | ORAL | 3 refills | Status: DC

## 2018-11-12 MED ORDER — BENAZEPRIL HCL 10 MG PO TABS: 10.0000 mg | ORAL_TABLET | Freq: Every day | ORAL | 3 refills | Status: DC

## 2018-11-12 MED ORDER — ONGLYZA 5 MG PO TABS: 5.0000 mg | ORAL_TABLET | Freq: Every day | ORAL | 3 refills | Status: DC

## 2018-11-12 NOTE — Progress Notes (Signed)
[facility] [address] [place], [place] [id number]  Internal MEDICINE  Office Visit Note  Patient Name: Regina Short Patient  [id number] 10, 2062  [id number]  Date of Service: 11/12/2018  Chief Complaint  Patient presents with  . Hypertension  . Diabetes  . Quality Metric Gaps    pneumonia and eye exam    Patient is here for routine follow up visit for both diabetes and hypertension. Has not been taking amlodipine as it makes her feel sluggish. She has been taking nothing for BP at all. Blood pressure only slightly elevated at 142/86. She has taken ARB in the past, but this made her very itchy.  Blood sugars elevated. HgbA1c up nearly 2 points since her last visit. Admits to eating a lot of sweets recently. Continues to take medications as prescribed.       Current Medication: Outpatient Encounter Medications as of 11/12/2018  Medication Sig  . ergocalciferol (DRISDOL) 50000 units capsule Take 1 capsule (50,000 Units total) by mouth once a week.  . fluconazole (DIFLUCAN) 150 MG tablet Take 1 tablet po every other day for total of 5 doses  . glucose blood (ACCU-CHEK GUIDE) test strip Blood sugar testing QAM and prn  . glyBURIDE-metformin (GLUCOVANCE) 5-500 MG tablet Take 1.5 tablets by mouth 2 (two) times daily.  . ONGLYZA 5 MG TABS tablet Take 1 tablet (5 mg total) by mouth daily.  . [DISCONTINUED] amLODipine (NORVASC) 5 MG tablet Take 1 tablet (5 mg total) by mouth daily.  . [DISCONTINUED] glyBURIDE-metformin (GLUCOVANCE) 5-500 MG tablet Take 1 tablet by mouth 2 (two) times daily.  . [DISCONTINUED] losartan (COZAAR) 50 MG tablet Take 1 tablet (50 mg total) by mouth daily.  . [DISCONTINUED] ONGLYZA 5 MG TABS tablet Take 1 tablet (5 mg total) by mouth daily.  . benazepril (LOTENSIN) 10 MG tablet Take 1 tablet (10 mg total) by mouth daily.   No facility-administered encounter medications on file as of 11/12/2018.     Surgical History: Past Surgical History:  Procedure  Laterality Date  . ABDOMINAL HYSTERECTOMY    . ABDOMINAL SURGERY     5 tumors removed  . BREAST BIOPSY Right 02/02/15 core   focal fibroadenomatoid changes and sclerosis    Medical History: Past Medical History:  Diagnosis Date  . Chronic back pain   . Diabetes mellitus without complication (HCC)   . GERD (gastroesophageal reflux disease)   . Hand tendonitis   . Hypertension     Family History: Family History  Problem Relation Age of Onset  . Congestive Heart Failure Unknown   . Breast cancer Maternal Aunt [id number]    Social History   Socioeconomic History  . Marital status: Single    Spouse name: Not on file  . Number of children: Not on file  . Years of education: Not on file  . Highest education level: Not on file  Occupational History  . Not on file  Social Needs  . Financial resource strain: Not on file  . Food insecurity:    Worry: Not on file    Inability: Not on file  . Transportation needs:    Medical: Not on file    Non-medical: Not on file  Tobacco Use  . Smoking status: Current Every Day Smoker    Packs/day: 0.50    Last attempt to quit: 11/20/2014    Years since quitting: 3.9  . Smokeless tobacco: Former Engineer, waterUser  Substance and Sexual Activity  . Alcohol use: No    Alcohol/week:  0.0 standard drinks    Comment: occassional drinks, last drink 6 months ago   . Drug use: No  . Sexual activity: Yes    Birth control/protection: None  Lifestyle  . Physical activity:    Days per week: Not on file    Minutes per session: Not on file  . Stress: Not on file  Relationships  . Social connections:    Talks on phone: Not on file    Gets together: Not on file    Attends religious service: Not on file    Active member of club or organization: Not on file    Attends meetings of clubs or organizations: Not on file    Relationship status: Not on file  . Intimate partner violence:    Fear of current or ex partner: Not on file    Emotionally abused: Not on file     Physically abused: Not on file    Forced sexual activity: Not on file  Other Topics Concern  . Not on file  Social History Narrative  . Not on file      Review of Systems  Constitutional: Negative for activity change, chills, fatigue and unexpected weight change.  HENT: Negative for congestion, postnasal drip, rhinorrhea, sneezing and sore throat.   Eyes: Negative.   Respiratory: Negative for cough, chest tightness, shortness of breath and wheezing.   Cardiovascular: Negative for chest pain and palpitations.  Gastrointestinal: Negative for abdominal pain, constipation, diarrhea, nausea and vomiting.  Endocrine: Negative for cold intolerance, heat intolerance, polydipsia, polyphagia and polyuria.       Blood sugars elevated since her last visit.   Genitourinary: Negative for dysuria, frequency, urgency and vaginal discharge.  Musculoskeletal: Positive for arthralgias and myalgias. Negative for back pain, joint swelling and neck pain.  Skin: Negative for rash.  Allergic/Immunologic: Negative for environmental allergies.  Neurological: Negative for dizziness, tremors, numbness and headaches.  Hematological: Negative for adenopathy. Does not bruise/bleed easily.  Psychiatric/Behavioral: Negative for behavioral problems (Depression), sleep disturbance and suicidal ideas. The patient is not nervous/anxious.     Today's Vitals   11/12/18 0845  BP: (!) 142/86  Pulse: 68  Resp: 16  SpO2: 98%  Weight: 150 lb (68 kg)  Height: 5\' 6"  (1.676 m)    Physical Exam  Constitutional: She is oriented to person, place, and time. She appears well-developed and well-nourished. No distress.  HENT:  Head: Normocephalic and atraumatic.  Mouth/Throat: No oropharyngeal exudate.  Eyes: Pupils are equal, round, and reactive to light. EOM are normal.  Neck: Normal range of motion. Neck supple. No JVD present. Carotid bruit is not present. No tracheal deviation present. No thyromegaly present.   Cardiovascular: Normal rate, regular rhythm and normal heart sounds. Exam reveals no gallop and no friction rub.  No murmur heard. Pulses:      Dorsalis pedis pulses are 2+ on the right side, and 2+ on the left side.       Posterior tibial pulses are 2+ on the right side, and 2+ on the left side.  Pulmonary/Chest: Effort normal and breath sounds normal. No respiratory distress. She has no wheezes. She has no rales. She exhibits no tenderness.  Abdominal: Soft. Bowel sounds are normal. There is no tenderness.  Musculoskeletal: Normal range of motion.  4th finger of right hand is tender with palpation of medial joint. Currently wrapped in tape to provide support, ROM is limited.   Feet:  Right Foot:  Protective Sensation: 10 sites tested. 10 sites  sensed.  Left Foot:  Protective Sensation: 10 sites tested. 10 sites sensed.  Lymphadenopathy:    She has no cervical adenopathy.  Neurological: She is alert and oriented to person, place, and time. No cranial nerve deficit.  Skin: Skin is warm and dry. Capillary refill takes less than 2 seconds. She is not diaphoretic.  Psychiatric: She has a normal mood and affect. Her behavior is normal. Judgment and thought content normal.  Nursing note and vitals reviewed.  Assessment/Plan: 1. Uncontrolled type 2 diabetes mellitus with hyperglycemia (HCC) - POCT HgB A1C 8.1 today. Increased glucovance 5/500 to 1.5 tablets twice daily.  Continue onglyza 5mg  daily. Discussed proper diet choices and adding exercise into her daily routine.    2. Essential hypertension D/c amlopdipine due to negative side effects. Add benazepril 10mg  daily. Monitor closely  - benazepril (LOTENSIN) 10 MG tablet; Take 1 tablet (10 mg total) by mouth daily.  Dispense: 30 tablet; Refill: 3  3. Vitamin D deficiency Continue vitamin d weekly until prescription finished. Recommend OTC vitamin D3 2000iu daily.   General Counseling: orie cuttino understanding of the findings of  todays visit and agrees with plan of treatment. I have discussed any further diagnostic evaluation that may be needed or ordered today. We also reviewed her medications today. she has been encouraged to call the office with any questions or concerns that should arise related to todays visit.  Diabetes Counseling:  1. Addition of ACE inh/ ARB'S for nephroprotection. Microalbumin is updated  2. Diabetic foot care, prevention of complications. Podiatry consult 3. Exercise and lose weight.  4. Diabetic eye examination, Diabetic eye exam is updated  5. Monitor blood sugar closlely. nutrition counseling.  6. Sign and symptoms of hypoglycemia including shaking sweating,confusion and headaches.  This patient was seen by Vincent Gros FNP Collaboration with Dr Lyndon Code as a part of collaborative care agreement  Orders Placed This Encounter  Procedures  . POCT HgB A1C    Meds ordered this encounter  Medications  . benazepril (LOTENSIN) 10 MG tablet    Sig: Take 1 tablet (10 mg total) by mouth daily.    Dispense:  30 tablet    Refill:  3    Order Specific Question:   Supervising Provider    Answer:   Lyndon Code [[id number]]  . glyBURIDE-metformin (GLUCOVANCE) 5-500 MG tablet    Sig: Take 1.5 tablets by mouth 2 (two) times daily.    Dispense:  90 tablet    Refill:  3    Order Specific Question:   Supervising Provider    Answer:   Lyndon Code [[id number]]  . ONGLYZA 5 MG TABS tablet    Sig: Take 1 tablet (5 mg total) by mouth daily.    Dispense:  30 tablet    Refill:  3    Order Specific Question:   Supervising Provider    Answer:   Lyndon Code [[id number]]    Time spent: 80 Minutes      Dr Lyndon Code Internal medicine

## 2019-01-28 ENCOUNTER — Other Ambulatory Visit: Payer: Self-pay

## 2019-01-28 DIAGNOSIS — E119 Type 2 diabetes mellitus without complications: Secondary | ICD-10-CM

## 2019-01-28 MED ORDER — GLUCOSE BLOOD VI STRP: ORAL_STRIP | 12 refills | Status: DC

## 2019-02-18 ENCOUNTER — Ambulatory Visit (INDEPENDENT_AMBULATORY_CARE_PROVIDER_SITE_OTHER): Payer: BLUE CROSS/BLUE SHIELD | Admitting: Nurse Practitioner

## 2019-02-18 ENCOUNTER — Encounter: Payer: Self-pay | Admitting: Nurse Practitioner

## 2019-02-18 VITALS — BP 130/80 | HR 65 | Resp 16 | Ht 66.0 in | Wt 147.0 lb

## 2019-02-18 DIAGNOSIS — E1165 Type 2 diabetes mellitus with hyperglycemia: Secondary | ICD-10-CM

## 2019-02-18 DIAGNOSIS — I10 Essential (primary) hypertension: Secondary | ICD-10-CM | POA: Diagnosis not present

## 2019-02-18 DIAGNOSIS — E119 Type 2 diabetes mellitus without complications: Secondary | ICD-10-CM

## 2019-02-18 DIAGNOSIS — M25562 Pain in left knee: Secondary | ICD-10-CM | POA: Diagnosis not present

## 2019-02-18 LAB — POCT GLYCOSYLATED HEMOGLOBIN (HGB A1C): Hemoglobin A1C: 6.9 % — AB (ref 4.0–5.6)

## 2019-02-18 MED ORDER — GLUCOSE BLOOD VI STRP: ORAL_STRIP | 12 refills | Status: DC

## 2019-02-18 MED ORDER — BENAZEPRIL HCL 10 MG PO TABS: 10.0000 mg | ORAL_TABLET | Freq: Every day | ORAL | 5 refills | Status: DC

## 2019-02-18 NOTE — Progress Notes (Signed)
[facility] [place], [place] [address]  Internal MEDICINE  Office Visit Note  Patient Name: Regina Short  [id number]  [id number]  Date of Service: 02/18/2019  Chief Complaint  Patient presents with  . Hypertension  . Diabetes  . Knee Pain    left  . Quality Metric Gaps    Eye exam and pneumonia    The patient is c/o left knee pain. Has been more severe after she has been standing up and working for long days. The pain will radiate around the back of her knee and down the right lower leg. There is swelling present.  She has had this in the past. Has had to have fluid drained from the knee which really helped the pain. She needs to have a referral to see her orthopedic doctor for this again.  Blood sugars improving. Increased her glucovance to 1.5 tablets twice daily. She has improved her diet and lowered the amount of carbohydrates she is eating. HgbA1c has come down to 6.9 from 8.1 at the last check 10/2018. Her blood pressure is well controlled.       Current Medication: Outpatient Encounter Medications as of 02/18/2019  Medication Sig  . benazepril (LOTENSIN) 10 MG tablet Take 1 tablet (10 mg total) by mouth daily.  . ergocalciferol (DRISDOL) 50000 units capsule Take 1 capsule (50,000 Units total) by mouth once a week.  . fluconazole (DIFLUCAN) 150 MG tablet Take 1 tablet po every other day for total of 5 doses  . glucose blood (ACCU-CHEK GUIDE) test strip Blood sugar testing QAM and prn  . glyBURIDE-metformin (GLUCOVANCE) 5-500 MG tablet Take 1.5 tablets by mouth 2 (two) times daily.  . Lancets Misc. (ACCU-CHEK FASTCLIX LANCET) KIT Accu-Chek Fastclix [employer]  . ONGLYZA 5 MG TABS tablet Take 1 tablet (5 mg total) by mouth daily.  . [DISCONTINUED] benazepril (LOTENSIN) 10 MG tablet Take 1 tablet (10 mg total) by mouth daily.  . [DISCONTINUED] glucose blood (ACCU-CHEK GUIDE) test strip Blood sugar testing QAM and prn   No facility-administered  encounter medications on file as of 02/18/2019.     Surgical History: Past Surgical History:  Procedure Laterality Date  . ABDOMINAL HYSTERECTOMY    . ABDOMINAL SURGERY     5 tumors removed  . BREAST BIOPSY Right 02/02/15 core   focal fibroadenomatoid changes and sclerosis    Medical History: Past Medical History:  Diagnosis Date  . Chronic back pain   . Diabetes mellitus without complication ([place])   . GERD (gastroesophageal reflux disease)   . Hand tendonitis   . Hypertension     Family History: Family History  Problem Relation Age of Onset  . Congestive Heart Failure Other   . Breast cancer Maternal Aunt 60    Social History   Socioeconomic History  . Marital status: Single    Spouse name: Not on file  . Number of children: Not on file  . Years of education: Not on file  . Highest education level: Not on file  Occupational History  . Not on file  Social Needs  . Financial resource strain: Not on file  . Food insecurity:    Worry: Not on file    Inability: Not on file  . Transportation needs:    Medical: Not on file    Non-medical: Not on file  Tobacco Use  . Smoking status: Current Every Day Smoker    Packs/day: 0.50    Last attempt to quit: 11/20/2014  Years since quitting: 4.2  . Smokeless tobacco: Former Network engineer and Sexual Activity  . Alcohol use: No    Alcohol/week: 0.0 standard drinks    Comment: occassional drinks, last drink 6 months ago   . Drug use: No  . Sexual activity: Yes    Birth control/protection: None  Lifestyle  . Physical activity:    Days per week: Not on file    Minutes per session: Not on file  . Stress: Not on file  Relationships  . Social connections:    Talks on phone: Not on file    Gets together: Not on file    Attends religious service: Not on file    Active member of club or organization: Not on file    Attends meetings of clubs or organizations: Not on file    Relationship status: Not on file  .  Intimate partner violence:    Fear of current or ex partner: Not on file    Emotionally abused: Not on file    Physically abused: Not on file    Forced sexual activity: Not on file  Other Topics Concern  . Not on file  Social History Narrative  . Not on file      Review of Systems  Constitutional: Negative for activity change, chills, fatigue and unexpected weight change.  HENT: Negative for congestion, postnasal drip, rhinorrhea, sneezing and sore throat.   Respiratory: Negative for cough, chest tightness, shortness of breath and wheezing.   Cardiovascular: Negative for chest pain and palpitations.  Gastrointestinal: Negative for abdominal pain, constipation, diarrhea, nausea and vomiting.  Endocrine: Negative for cold intolerance, heat intolerance, polydipsia, polyphagia and polyuria.       Improved blood sugars since her last visit .  Genitourinary: Negative for vaginal discharge.  Musculoskeletal: Positive for arthralgias and myalgias. Negative for back pain, joint swelling and neck pain.       Left knee pain and swelling .  Skin: Negative for rash.  Allergic/Immunologic: Negative for environmental allergies.  Neurological: Negative for dizziness, tremors, numbness and headaches.  Hematological: Negative for adenopathy. Does not bruise/bleed easily.  Psychiatric/Behavioral: Negative for behavioral problems (Depression), sleep disturbance and suicidal ideas. The patient is not nervous/anxious.     Today's Vitals   02/18/19 0832  BP: 130/80  Pulse: 65  Resp: 16  SpO2: 97%  Weight: 147 lb (66.7 kg)  Height: '5\' 6"'  (1.676 m)   Body mass index is 23.73 kg/m.  Physical Exam Vitals signs and nursing note reviewed.  Constitutional:      General: She is not in acute distress.    Appearance: Normal appearance. She is well-developed. She is not diaphoretic.  HENT:     Head: Normocephalic and atraumatic.     Mouth/Throat:     Pharynx: No oropharyngeal exudate.  Eyes:      Pupils: Pupils are equal, round, and reactive to light.  Neck:     Musculoskeletal: Normal range of motion and neck supple.     Thyroid: No thyromegaly.     Vascular: No carotid bruit or JVD.     Trachea: No tracheal deviation.  Cardiovascular:     Rate and Rhythm: Normal rate and regular rhythm.     Heart sounds: Normal heart sounds. No murmur. No friction rub. No gallop.   Pulmonary:     Effort: Pulmonary effort is normal. No respiratory distress.     Breath sounds: Normal breath sounds. No wheezing or rales.  Chest:  Chest wall: No tenderness.  Abdominal:     General: Bowel sounds are normal.     Palpations: Abdomen is soft.  Musculoskeletal: Normal range of motion.       Legs:  Lymphadenopathy:     Cervical: No cervical adenopathy.  Skin:    General: Skin is warm and dry.  Neurological:     Mental Status: She is alert and oriented to person, place, and time.     Cranial Nerves: No cranial nerve deficit.  Psychiatric:        Behavior: Behavior normal.        Thought Content: Thought content normal.        Judgment: Judgment normal.   Assessment/Plan: 1. Uncontrolled type 2 diabetes mellitus with hyperglycemia (HCC) - POCT HgB A1C 6.9 today, down from 8.1 at last check. Continue diabetic medication as prescribed. Refer for diabetic eye exam . - Ambulatory referral to Ophthalmology  2. Essential hypertension Stable. Continue bp medication as prescribed  - benazepril (LOTENSIN) 10 MG tablet; Take 1 tablet (10 mg total) by mouth daily.  Dispense: 30 tablet; Refill: 5  3. Left knee pain, unspecified chronicity Rest, ice, elevate the knee when possible. Refer to orthopedic provider for further evaluation and treatment.  - Ambulatory referral to Orthopedic Surgery   General Counseling: sonyia muro understanding of the findings of todays visit and agrees with plan of treatment. I have discussed any further diagnostic evaluation that may be needed or ordered today. We  also reviewed her medications today. she has been encouraged to call the office with any questions or concerns that should arise related to todays visit.  Diabetes Counseling:  1. Addition of ACE inh/ ARB'S for nephroprotection. Microalbumin is updated  2. Diabetic foot care, prevention of complications. Podiatry consult 3. Exercise and lose weight.  4. Diabetic eye examination, Diabetic eye exam is updated  5. Monitor blood sugar closlely. nutrition counseling.  6. Sign and symptoms of hypoglycemia including shaking sweating,confusion and headaches.  This patient was seen by Leretha Pol FNP Collaboration with Dr Lavera Guise as a part of collaborative care agreement  Orders Placed This Encounter  Procedures  . Ambulatory referral to Ophthalmology  . Ambulatory referral to Orthopedic Surgery  . POCT HgB A1C    Meds ordered this encounter  Medications  . benazepril (LOTENSIN) 10 MG tablet    Sig: Take 1 tablet (10 mg total) by mouth daily.    Dispense:  30 tablet    Refill:  5    Order Specific Question:   Supervising Provider    Answer:   Lavera Guise [[id number]]  . glucose blood (ACCU-CHEK GUIDE) test strip    Sig: Blood sugar testing QAM and prn    Dispense:  100 each    Refill:  12    Order Specific Question:   Supervising Provider    Answer:   Lavera Guise [[id number]]    Time spent: 64 Minutes      Dr Lavera Guise Internal medicine

## 2019-04-19 ENCOUNTER — Other Ambulatory Visit: Payer: Self-pay | Admitting: Nurse Practitioner

## 2019-04-19 DIAGNOSIS — E1165 Type 2 diabetes mellitus with hyperglycemia: Secondary | ICD-10-CM

## 2019-04-19 DIAGNOSIS — E119 Type 2 diabetes mellitus without complications: Secondary | ICD-10-CM

## 2019-04-19 MED ORDER — ONGLYZA 5 MG PO TABS: 5.0000 mg | ORAL_TABLET | Freq: Every day | ORAL | 3 refills | Status: DC

## 2019-04-19 MED ORDER — GLYBURIDE-METFORMIN 5-500 MG PO TABS: 1.5000 | ORAL_TABLET | Freq: Two times a day (BID) | ORAL | 3 refills | Status: DC

## 2019-05-02 ENCOUNTER — Other Ambulatory Visit: Payer: Self-pay

## 2019-05-02 ENCOUNTER — Ambulatory Visit (INDEPENDENT_AMBULATORY_CARE_PROVIDER_SITE_OTHER): Payer: BLUE CROSS/BLUE SHIELD | Admitting: Nurse Practitioner

## 2019-05-02 ENCOUNTER — Encounter: Payer: Self-pay | Admitting: Nurse Practitioner

## 2019-05-02 VITALS — BP 134/82 | HR 83 | Temp 98.1°F | Resp 16 | Ht 66.0 in | Wt 146.0 lb

## 2019-05-02 DIAGNOSIS — I10 Essential (primary) hypertension: Secondary | ICD-10-CM

## 2019-05-02 DIAGNOSIS — L240 Irritant contact dermatitis due to detergents: Secondary | ICD-10-CM

## 2019-05-02 DIAGNOSIS — E1165 Type 2 diabetes mellitus with hyperglycemia: Secondary | ICD-10-CM | POA: Diagnosis not present

## 2019-05-02 MED ORDER — METHYLPREDNISOLONE 4 MG PO TBPK: ORAL_TABLET | ORAL | 0 refills | Status: DC

## 2019-05-02 MED ORDER — TRIAMCINOLONE ACETONIDE 0.025 % EX CREA: 1.0000 "application " | TOPICAL_CREAM | Freq: Two times a day (BID) | CUTANEOUS | 2 refills | Status: DC

## 2019-05-02 NOTE — Progress Notes (Signed)
[facility] [place], [place] [address]  Internal MEDICINE  Office Visit Note  Patient Name: Regina Short  [id number]  [id number]  Date of Service: 05/18/2019   Pt is here for a sick visit.   Chief Complaint  Patient presents with  . Rash    Red bumps all over body, pt took some rapid relief medication wednesday and thursday prior to the break out on Thursday, pt has taken OTC benadryl to help, has not changed laundry detergent or soap that she can remember (ststed that the soap she may have gotten some soap with scent instead of nonscented) , no fever just mild itching     The patient is here and complaining of fine, red, itchy rash which is all over the body. She does not remember being bit by anything or being exposed to poison ivy or poison oak. She initially does not remember changing body care products such as soaps or lotions. After careful consideration, she states that her daughter brought her a bottle of tide when she generally uses Gain detergent. She states that she started using the new detergent last Thursday. Friday is when she first noted the rash. Has gradually become worse       Current Medication:  Outpatient Encounter Medications as of 05/02/2019  Medication Sig  . benazepril (LOTENSIN) 10 MG tablet Take 1 tablet (10 mg total) by mouth daily.  . ergocalciferol (DRISDOL) 50000 units capsule Take 1 capsule (50,000 Units total) by mouth once a week.  . fluconazole (DIFLUCAN) 150 MG tablet Take 1 tablet po every other day for total of 5 doses  . glucose blood (ACCU-CHEK GUIDE) test strip Blood sugar testing QAM and prn  . glyBURIDE-metformin (GLUCOVANCE) 5-500 MG tablet Take 1.5 tablets by mouth 2 (two) times daily.  . Lancets Misc. (ACCU-CHEK FASTCLIX LANCET) KIT Accu-Chek Fastclix [employer]  . methylPREDNISolone (MEDROL) 4 MG TBPK tablet Take by mouth as directed for 6 days  . ONGLYZA 5 MG TABS tablet Take 1 tablet (5 mg total) by  mouth daily.  Marland Kitchen triamcinolone (KENALOG) 0.025 % cream Apply 1 application topically 2 (two) times daily.   No facility-administered encounter medications on file as of 05/02/2019.       Medical History: Past Medical History:  Diagnosis Date  . Chronic back pain   . Diabetes mellitus without complication ([place])   . GERD (gastroesophageal reflux disease)   . Hand tendonitis   . Hypertension      Vital Signs: BP 134/82 (BP Location: Right Arm, Patient Position: Sitting, Cuff Size: Normal)   Pulse 83   Temp 98.1 F (36.7 C)   Resp 16   Ht '5\' 6"'  (1.676 m)   Wt 146 lb (66.2 kg)   SpO2 98%   BMI 23.57 kg/m    Review of Systems  Constitutional: Negative for chills, fatigue and unexpected weight change.  HENT: Negative for congestion, postnasal drip, rhinorrhea, sneezing and sore throat.   Respiratory: Negative for cough, chest tightness and shortness of breath.   Cardiovascular: Negative for chest pain and palpitations.  Gastrointestinal: Negative for abdominal pain, constipation, diarrhea, nausea and vomiting.  Musculoskeletal: Negative for arthralgias, back pain, joint swelling and neck pain.  Skin: Positive for rash.       Generalized, red, raised, itchy rash al over her body.   Allergic/Immunologic: Positive for environmental allergies.  Neurological: Negative for tremors and numbness.  Hematological: Negative for adenopathy. Does not bruise/bleed easily.  Psychiatric/Behavioral: Negative  for behavioral problems (Depression), sleep disturbance and suicidal ideas. The patient is not nervous/anxious.     Physical Exam Constitutional:      General: She is not in acute distress.    Appearance: Normal appearance. She is well-developed. She is not diaphoretic.  HENT:     Head: Normocephalic and atraumatic.     Mouth/Throat:     Pharynx: No oropharyngeal exudate.  Eyes:     Pupils: Pupils are equal, round, and reactive to light.  Neck:     Musculoskeletal: Normal range of  motion and neck supple.     Thyroid: No thyromegaly.     Vascular: No JVD.     Trachea: No tracheal deviation.  Cardiovascular:     Rate and Rhythm: Normal rate and regular rhythm.     Heart sounds: Normal heart sounds. No murmur. No friction rub. No gallop.   Pulmonary:     Effort: Pulmonary effort is normal. No respiratory distress.     Breath sounds: No wheezing or rales.  Chest:     Chest wall: No tenderness.  Abdominal:     General: Bowel sounds are normal.     Palpations: Abdomen is soft.  Musculoskeletal: Normal range of motion.  Lymphadenopathy:     Cervical: No cervical adenopathy.  Skin:    General: Skin is warm and dry.     Comments: There is fine, red, raised rash, covering arms, legs, back, abdomen, and neck. Slightly red. No open lesions. Appears erythematous.   Neurological:     Mental Status: She is alert and oriented to person, place, and time.     Cranial Nerves: No cranial nerve deficit.  Psychiatric:        Behavior: Behavior normal.        Thought Content: Thought content normal.        Judgment: Judgment normal.   Assessment/Plan: 1. Contact dermatitis and eczema due to detergents Start medrol dose pack. Take as directed for 6 days. Monitor blood sugars closely as this may raise them up. Add triamcinolone0.025% cram twice daily as needed for itching and irritation.  - methylPREDNISolone (MEDROL) 4 MG TBPK tablet; Take by mouth as directed for 6 days  Dispense: 21 tablet; Refill: 0 - triamcinolone (KENALOG) 0.025 % cream; Apply 1 application topically 2 (two) times daily.  Dispense: 80 g; Refill: 2  2. Type 2 diabetes mellitus with hyperglycemia, without long-term current use of insulin (HCC) Monitor blood sugars closely while taking oral steroid. Continue diabetic medication as prescribed   3. Essential hypertension Continue bp medication as prescribed .  General Counseling: carianna lague understanding of the findings of todays visit and agrees with  plan of treatment. I have discussed any further diagnostic evaluation that may be needed or ordered today. We also reviewed her medications today. she has been encouraged to call the office with any questions or concerns that should arise related to todays visit.    Counseling:  This patient was seen by [place] with Dr Lavera Guise as a part of collaborative care agreement  Meds ordered this encounter  Medications  . methylPREDNISolone (MEDROL) 4 MG TBPK tablet    Sig: Take by mouth as directed for 6 days    Dispense:  21 tablet    Refill:  0    Order Specific Question:   Supervising Provider    Answer:   Lavera Guise [place]  . triamcinolone (KENALOG) 0.025 % cream    Sig: Apply  1 application topically 2 (two) times daily.    Dispense:  80 g    Refill:  2    Order Specific Question:   Supervising Provider    Answer:   Lavera Guise [[id number]]    Time spent: 25 Minutes

## 2019-05-18 DIAGNOSIS — L240 Irritant contact dermatitis due to detergents: Secondary | ICD-10-CM | POA: Insufficient documentation

## 2019-05-20 ENCOUNTER — Encounter: Payer: Self-pay | Admitting: Nurse Practitioner

## 2019-05-20 ENCOUNTER — Ambulatory Visit: Payer: BLUE CROSS/BLUE SHIELD | Admitting: Nurse Practitioner

## 2019-05-20 ENCOUNTER — Other Ambulatory Visit: Payer: Self-pay

## 2019-05-20 DIAGNOSIS — L240 Irritant contact dermatitis due to detergents: Secondary | ICD-10-CM | POA: Diagnosis not present

## 2019-05-20 DIAGNOSIS — J301 Allergic rhinitis due to pollen: Secondary | ICD-10-CM

## 2019-05-20 DIAGNOSIS — E119 Type 2 diabetes mellitus without complications: Secondary | ICD-10-CM

## 2019-05-20 DIAGNOSIS — I10 Essential (primary) hypertension: Secondary | ICD-10-CM | POA: Diagnosis not present

## 2019-05-20 MED ORDER — BENAZEPRIL HCL 10 MG PO TABS: 10.0000 mg | ORAL_TABLET | Freq: Every day | ORAL | 3 refills | Status: DC

## 2019-05-20 MED ORDER — FLUTICASONE PROPIONATE 50 MCG/ACT NA SUSP: 2.0000 | Freq: Every day | NASAL | 3 refills | Status: DC

## 2019-05-20 MED ORDER — GLUCOSE BLOOD VI STRP: ORAL_STRIP | 3 refills | Status: DC

## 2019-05-20 NOTE — Progress Notes (Signed)
[facility] [place], [place] [address]  Internal MEDICINE  Telephone Visit  Patient Name: Regina Short  [id number]  [id number]  Date of Service: 05/21/2019  I connected with the patient at 9:29AM by webcam and verified the patients identity using two identifiers.   I discussed the limitations, risks, security and privacy concerns of performing an evaluation and management service by webcam and the availability of in person appointments. I also discussed with the patient that there may be a patient responsible charge related to the service.  The patient expressed understanding and agrees to proceed.    Chief Complaint  Patient presents with  . Telephone Assessment  . Telephone Screen  . Diabetes    FASTING GLUCOSE 144   . Sinusitis    The patient has been contacted via webcam for follow up visit due to concerns for spread of novel coronavirus.  The patient states that she has been having nasal congestion. Really only bothering her at night. During the day, it seems to be fine. Feels ok otherwise. previously reported rash has subsided .blood sugars did run higher while on Medrol, hweber, they are coming back to normal. Will check HgbA1c at her next, in-office visit .      Current Medication: Outpatient Encounter Medications as of 05/20/2019  Medication Sig  . benazepril (LOTENSIN) 10 MG tablet Take 1 tablet (10 mg total) by mouth daily.  Marland Kitchen glucose blood (ACCU-CHEK GUIDE) test strip Blood sugar testing QAM and prn - E11.65  . glyBURIDE-metformin (GLUCOVANCE) 5-500 MG tablet Take 1.5 tablets by mouth 2 (two) times daily.  . Lancets Misc. (ACCU-CHEK FASTCLIX LANCET) KIT Accu-Chek Fastclix [employer]  . ONGLYZA 5 MG TABS tablet Take 1 tablet (5 mg total) by mouth daily.  . [DISCONTINUED] benazepril (LOTENSIN) 10 MG tablet Take 1 tablet (10 mg total) by mouth daily.  . [DISCONTINUED] glucose blood (ACCU-CHEK GUIDE) test strip Blood sugar testing QAM and prn  .  fluconazole (DIFLUCAN) 150 MG tablet Take 1 tablet po every other day for total of 5 doses (Patient not taking: Reported on 05/20/2019)  . fluticasone (FLONASE) 50 MCG/ACT nasal spray Place 2 sprays into both nostrils daily.  Marland Kitchen triamcinolone (KENALOG) 0.025 % cream Apply 1 application topically 2 (two) times daily. (Patient not taking: Reported on 05/20/2019)  . [DISCONTINUED] ergocalciferol (DRISDOL) 50000 units capsule Take 1 capsule (50,000 Units total) by mouth once a week. (Patient not taking: Reported on 05/20/2019)  . [DISCONTINUED] methylPREDNISolone (MEDROL) 4 MG TBPK tablet Take by mouth as directed for 6 days (Patient not taking: Reported on 05/20/2019)   No facility-administered encounter medications on file as of 05/20/2019.     Surgical History: Past Surgical History:  Procedure Laterality Date  . ABDOMINAL HYSTERECTOMY    . ABDOMINAL SURGERY     5 tumors removed  . BREAST BIOPSY Right 02/02/15 core   focal fibroadenomatoid changes and sclerosis    Medical History: Past Medical History:  Diagnosis Date  . Chronic back pain   . Diabetes mellitus without complication ([place])   . GERD (gastroesophageal reflux disease)   . Hand tendonitis   . Hypertension     Family History: Family History  Problem Relation Age of Onset  . Congestive Heart Failure Other   . Breast cancer Maternal Aunt 60    Social History   Socioeconomic History  . Marital status: Single    Spouse name: Not on file  . Number of children: Not on file  .  Years of education: Not on file  . Highest education level: Not on file  Occupational History  . Not on file  Social Needs  . Financial resource strain: Not on file  . Food insecurity:    Worry: Not on file    Inability: Not on file  . Transportation needs:    Medical: Not on file    Non-medical: Not on file  Tobacco Use  . Smoking status: Current Every Day Smoker    Packs/day: 0.50    Last attempt to quit: 11/20/2014    Years since  quitting: 4.5  . Smokeless tobacco: Former Network engineer and Sexual Activity  . Alcohol use: No    Alcohol/week: 0.0 standard drinks    Comment: occassional drinks, last drink 6 months ago   . Drug use: No  . Sexual activity: Yes    Birth control/protection: None  Lifestyle  . Physical activity:    Days per week: Not on file    Minutes per session: Not on file  . Stress: Not on file  Relationships  . Social connections:    Talks on phone: Not on file    Gets together: Not on file    Attends religious service: Not on file    Active member of club or organization: Not on file    Attends meetings of clubs or organizations: Not on file    Relationship status: Not on file  . Intimate partner violence:    Fear of current or ex partner: Not on file    Emotionally abused: Not on file    Physically abused: Not on file    Forced sexual activity: Not on file  Other Topics Concern  . Not on file  Social History Narrative  . Not on file      Review of Systems  Constitutional: Negative for activity change, chills, fatigue and unexpected weight change.  HENT: Positive for congestion, postnasal drip and rhinorrhea. Negative for sneezing and sore throat.        Mostly in the evenings and at night.   Respiratory: Negative for cough, chest tightness, shortness of breath and wheezing.   Cardiovascular: Negative for chest pain and palpitations.  Gastrointestinal: Negative for abdominal pain, constipation, diarrhea, nausea and vomiting.  Endocrine: Negative for cold intolerance, heat intolerance, polydipsia and polyuria.       Blood sugars doing well   Genitourinary: Negative for vaginal discharge.  Musculoskeletal: Negative for arthralgias, back pain, joint swelling, myalgias and neck pain.  Skin: Negative for rash.       Generalized skin rash she had at last visit has mostly resolved.   Allergic/Immunologic: Positive for environmental allergies.  Neurological: Negative for dizziness,  tremors, numbness and headaches.  Hematological: Negative for adenopathy. Does not bruise/bleed easily.  Psychiatric/Behavioral: Negative for behavioral problems (Depression), sleep disturbance and suicidal ideas. The patient is not nervous/anxious.     Vital Signs: There were no vitals taken for this visit.   Observation/Objective:   The patient is alert and oriented. She is pleasant and answers all questions appropriately. Breathing is non-labored. She is in no acute distress at this time.    Assessment/Plan: 1. Diabetes mellitus without complication ([place]) Blood sugars doing well. Continue medication as prescribed. Check HgbA1c at next, in-office visit.  - glucose blood (ACCU-CHEK GUIDE) test strip; Blood sugar testing QAM and prn - E11.65  Dispense: 100 each; Refill: 3  2. Essential hypertension Stable. Continue bp medication as prescribed  - benazepril (LOTENSIN) 10  MG tablet; Take 1 tablet (10 mg total) by mouth daily.  Dispense: 90 tablet; Refill: 3  3. Allergic rhinitis due to pollen, unspecified seasonality Restart flonase nasal spray. Use two sprays in both nostrils every day.  - fluticasone (FLONASE) 50 MCG/ACT nasal spray; Place 2 sprays into both nostrils daily.  Dispense: 42 g; Refill: 3  4. Contact dermatitis and eczema due to detergents Resolved .  General Counseling: doylene splinter understanding of the findings of today's phone visit and agrees with plan of treatment. I have discussed any further diagnostic evaluation that may be needed or ordered today. We also reviewed her medications today. she has been encouraged to call the office with any questions or concerns that should arise related to todays visit.  Diabetes Counseling:  1. Addition of ACE inh/ ARB'S for nephroprotection. Microalbumin is updated  2. Diabetic foot care, prevention of complications. Podiatry consult 3. Exercise and lose weight.  4. Diabetic eye examination, Diabetic eye exam is updated   5. Monitor blood sugar closlely. nutrition counseling.  6. Sign and symptoms of hypoglycemia including shaking sweating,confusion and headaches.   This patient was seen by [place] with Dr Lavera Guise as a part of collaborative care agreement    Meds ordered this encounter  Medications  . fluticasone (FLONASE) 50 MCG/ACT nasal spray    Sig: Place 2 sprays into both nostrils daily.    Dispense:  42 g    Refill:  3    Order Specific Question:   Supervising Provider    Answer:   Lavera Guise [[id number]]  . glucose blood (ACCU-CHEK GUIDE) test strip    Sig: Blood sugar testing QAM and prn - E11.65    Dispense:  100 each    Refill:  3    Order Specific Question:   Supervising Provider    Answer:   Lavera Guise [place]  . benazepril (LOTENSIN) 10 MG tablet    Sig: Take 1 tablet (10 mg total) by mouth daily.    Dispense:  90 tablet    Refill:  3    Order Specific Question:   Supervising Provider    Answer:   Lavera Guise [[id number]]    Time spent: 17 Minutes    Dr Lavera Guise Internal medicine

## 2019-05-21 DIAGNOSIS — J301 Allergic rhinitis due to pollen: Secondary | ICD-10-CM | POA: Insufficient documentation

## 2019-07-04 ENCOUNTER — Other Ambulatory Visit: Payer: Self-pay | Admitting: Internal Medicine

## 2019-07-04 DIAGNOSIS — Z1231 Encounter for screening mammogram for malignant neoplasm of breast: Secondary | ICD-10-CM

## 2019-07-22 ENCOUNTER — Encounter: Payer: Self-pay | Admitting: Nurse Practitioner

## 2019-07-22 ENCOUNTER — Ambulatory Visit: Payer: BLUE CROSS/BLUE SHIELD | Admitting: Nurse Practitioner

## 2019-07-22 ENCOUNTER — Other Ambulatory Visit: Payer: Self-pay

## 2019-07-22 VITALS — BP 143/87 | HR 75 | Resp 16 | Ht 66.0 in | Wt 146.0 lb

## 2019-07-22 DIAGNOSIS — Z0001 Encounter for general adult medical examination with abnormal findings: Secondary | ICD-10-CM | POA: Diagnosis not present

## 2019-07-22 DIAGNOSIS — M25562 Pain in left knee: Secondary | ICD-10-CM | POA: Diagnosis not present

## 2019-07-22 DIAGNOSIS — E1165 Type 2 diabetes mellitus with hyperglycemia: Secondary | ICD-10-CM | POA: Diagnosis not present

## 2019-07-22 DIAGNOSIS — G8929 Other chronic pain: Secondary | ICD-10-CM

## 2019-07-22 DIAGNOSIS — I10 Essential (primary) hypertension: Secondary | ICD-10-CM

## 2019-07-22 LAB — POCT GLYCOSYLATED HEMOGLOBIN (HGB A1C): Hemoglobin A1C: 6.8 % — AB (ref 4.0–5.6)

## 2019-07-22 NOTE — Progress Notes (Signed)
[facility] [place], [place] [address]  Internal MEDICINE  Office Visit Note  Patient Name: Regina Short  [id number]  [id number]  Date of Service: 07/22/2019  Chief Complaint  Patient presents with  . Annual Exam  . Diabetes  . Hypertension     The patient is here for health maintenance exam. She is complaining of left knee pain. This is most severe along the inner aspect of the knee. Has seen orthopedics for this in the past. Has had injections put into it. Given prescription for meloxicam. She states that ibuprofen helps more than meloxicam. She is otherwise, doing well. HgbA1c is 6.8. blood pressure is well controlled. She is scheduled for mammogram later this month. She had diabetic eye exam in July and her eyes are stable.    Current Medication: Outpatient Encounter Medications as of 07/22/2019  Medication Sig  . benazepril (LOTENSIN) 10 MG tablet Take 1 tablet (10 mg total) by mouth daily.  . fluticasone (FLONASE) 50 MCG/ACT nasal spray Place 2 sprays into both nostrils daily.  Marland Kitchen glucose blood (ACCU-CHEK GUIDE) test strip Blood sugar testing QAM and prn - E11.65  . glyBURIDE-metformin (GLUCOVANCE) 5-500 MG tablet Take 1.5 tablets by mouth 2 (two) times daily.  . Lancets Misc. (ACCU-CHEK FASTCLIX LANCET) KIT Accu-Chek Fastclix [employer]  . ONGLYZA 5 MG TABS tablet Take 1 tablet (5 mg total) by mouth daily.  Marland Kitchen triamcinolone (KENALOG) 0.025 % cream Apply 1 application topically 2 (two) times daily.  . fluconazole (DIFLUCAN) 150 MG tablet Take 1 tablet po every other day for total of 5 doses (Patient not taking: Reported on 07/22/2019)   No facility-administered encounter medications on file as of 07/22/2019.     Surgical History: Past Surgical History:  Procedure Laterality Date  . ABDOMINAL HYSTERECTOMY    . ABDOMINAL SURGERY     5 tumors removed  . BREAST BIOPSY Right 02/02/15 core   focal fibroadenomatoid changes and sclerosis    Medical  History: Past Medical History:  Diagnosis Date  . Chronic back pain   . Diabetes mellitus without complication ([place])   . GERD (gastroesophageal reflux disease)   . Hand tendonitis   . Hypertension     Family History: Family History  Problem Relation Age of Onset  . Congestive Heart Failure Other   . Breast cancer Maternal Aunt 60      Review of Systems  Constitutional: Negative for activity change, chills, fatigue and unexpected weight change.  HENT: Negative for congestion, postnasal drip, rhinorrhea, sneezing and sore throat.   Respiratory: Negative for cough, chest tightness, shortness of breath and wheezing.   Cardiovascular: Negative for chest pain and palpitations.  Gastrointestinal: Negative for abdominal pain, constipation, diarrhea, nausea and vomiting.  Endocrine: Negative for cold intolerance, heat intolerance, polydipsia and polyuria.       Improved blood sugars since her last visit .  Musculoskeletal: Positive for arthralgias and myalgias. Negative for back pain, joint swelling and neck pain.       Left knee pain and swelling .  Skin: Negative for rash.  Allergic/Immunologic: Negative for environmental allergies.  Neurological: Negative for dizziness, tremors, numbness and headaches.  Hematological: Negative for adenopathy. Does not bruise/bleed easily.  Psychiatric/Behavioral: Negative for behavioral problems (Depression), sleep disturbance and suicidal ideas. The patient is not nervous/anxious.     Today's Vitals   07/22/19 0856  BP: (!) 143/87  Pulse: 75  Resp: 16  SpO2: 97%  Weight: 146 lb (66.2 kg)  Height: '5\' 6"'  (1.676 m)   Body mass index is 23.57 kg/m.  Physical Exam Vitals signs and nursing note reviewed.  Constitutional:      General: She is not in acute distress.    Appearance: Normal appearance. She is well-developed. She is not diaphoretic.  HENT:     Head: Normocephalic and atraumatic.     Nose: Nose normal.     Mouth/Throat:      Pharynx: No oropharyngeal exudate.  Eyes:     Conjunctiva/sclera: Conjunctivae normal.     Pupils: Pupils are equal, round, and reactive to light.  Neck:     Musculoskeletal: Normal range of motion and neck supple.     Thyroid: No thyromegaly.     Vascular: No carotid bruit or JVD.     Trachea: No tracheal deviation.  Cardiovascular:     Rate and Rhythm: Normal rate and regular rhythm.     Pulses:          Dorsalis pedis pulses are 2+ on the right side and 2+ on the left side.       Posterior tibial pulses are 2+ on the right side and 2+ on the left side.     Heart sounds: Normal heart sounds. No murmur. No friction rub. No gallop.   Pulmonary:     Effort: Pulmonary effort is normal. No respiratory distress.     Breath sounds: Normal breath sounds. No wheezing or rales.  Chest:     Chest wall: No tenderness.     Breasts:        Right: Normal. No swelling, bleeding, inverted nipple, mass, nipple discharge, skin change or tenderness.        Left: Normal. No swelling, bleeding, inverted nipple, mass, nipple discharge, skin change or tenderness.  Abdominal:     General: Bowel sounds are normal.     Palpations: Abdomen is soft.     Tenderness: There is no abdominal tenderness.  Musculoskeletal: Normal range of motion.     Right foot: Normal range of motion. Bunion present. No deformity.     Left foot: Normal range of motion. No deformity or bunion.     Comments: 4th finger of right hand is tender with palpation of medial joint. Currently wrapped in tape to provide support, ROM is limited.   Feet:     Right foot:     Protective Sensation: 10 sites tested. 10 sites sensed.     Skin integrity: Blister present.     Toenail Condition: Right toenails are normal.     Left foot:     Protective Sensation: 10 sites tested. 10 sites sensed.     Skin integrity: Blister present.     Toenail Condition: Left toenails are normal.  Lymphadenopathy:     Cervical: No cervical adenopathy.  Skin:     General: Skin is warm and dry.     Capillary Refill: Capillary refill takes less than 2 seconds.     Comments: Ingrown toenails bilateral great toes  Neurological:     Mental Status: She is alert and oriented to person, place, and time.     Cranial Nerves: No cranial nerve deficit.  Psychiatric:        Behavior: Behavior normal.        Thought Content: Thought content normal.        Judgment: Judgment normal.      LABS: Recent Results (from the past 2160 hour(s))  POCT HgB A1C     Status:  Abnormal   Collection Time: 07/22/19  9:07 AM  Result Value Ref Range   Hemoglobin A1C 6.8 (A) 4.0 - 5.6 %   HbA1c POC (<> result, manual entry)     HbA1c, POC (prediabetic range)     HbA1c, POC (controlled diabetic range)     Assessment/Plan: 1. Encounter for general adult medical examination with abnormal findings Annual health maintenance exam today - UA/M w/rflx Culture, Routine  2. Uncontrolled type 2 diabetes mellitus with hyperglycemia (HCC) - POCT HgB A1C 6.8 today. Continue diabetic medication as prescribed   3. Essential hypertension Stable. Continue bp medication as prescribed   4. Chronic pain of left knee Apply a compressive ACE bandage. Rest and elevate the affected painful area.  Apply cold compresses intermittently as needed.  As pain recedes, begin normal activities slowly as tolerated.  Recommended she make appointment with orthopedic provider if needed   General Counseling: brenae lasecki understanding of the findings of todays visit and agrees with plan of treatment. I have discussed any further diagnostic evaluation that may be needed or ordered today. We also reviewed her medications today. she has been encouraged to call the office with any questions or concerns that should arise related to todays visit.    Counseling:  Diabetes Counseling:  1. Addition of ACE inh/ ARB'S for nephroprotection. Microalbumin is updated  2. Diabetic foot care, prevention of  complications. Podiatry consult 3. Exercise and lose weight.  4. Diabetic eye examination, Diabetic eye exam is updated  5. Monitor blood sugar closlely. nutrition counseling.  6. Sign and symptoms of hypoglycemia including shaking sweating,confusion and headaches.   Orders Placed This Encounter  Procedures  . UA/M w/rflx Culture, Routine  . POCT HgB A1C   This patient was seen by [place] with Dr Lavera Guise as a part of collaborative care agreement   Time spent: [place], MD  Internal Medicine

## 2019-07-23 LAB — UA/M W/RFLX CULTURE, ROUTINE
Bilirubin, UA: NEGATIVE
Glucose, UA: NEGATIVE
Ketones, UA: NEGATIVE
Leukocytes,UA: NEGATIVE
Nitrite, UA: NEGATIVE
Protein,UA: NEGATIVE
RBC, UA: NEGATIVE
Specific Gravity, UA: 1.021 (ref 1.005–1.030)
Urobilinogen, Ur: 0.2 mg/dL (ref 0.2–1.0)
pH, UA: 5 (ref 5.0–7.5)

## 2019-07-23 LAB — MICROSCOPIC EXAMINATION
Casts: NONE SEEN /lpf
Epithelial Cells (non renal): >10 /hpf — AB (ref 0–10)

## 2019-07-28 ENCOUNTER — Encounter: Payer: Self-pay | Admitting: Nurse Practitioner

## 2019-08-05 ENCOUNTER — Other Ambulatory Visit: Payer: Self-pay | Admitting: Nurse Practitioner

## 2019-08-06 LAB — COMPREHENSIVE METABOLIC PANEL
ALT: 7 IU/L (ref 0–32)
AST: 13 IU/L (ref 0–40)
Albumin/Globulin Ratio: 2.2 (ref 1.2–2.2)
Albumin: 4 g/dL (ref 3.8–4.9)
Alkaline Phosphatase: 98 IU/L (ref 39–117)
BUN/Creatinine Ratio: 26 — ABNORMAL HIGH (ref 9–23)
BUN: 15 mg/dL (ref 6–24)
Bilirubin Total: 0.3 mg/dL (ref 0.0–1.2)
CO2: 23 mmol/L (ref 20–29)
Calcium: 9.2 mg/dL (ref 8.7–10.2)
Chloride: 103 mmol/L (ref 96–106)
Creatinine, Ser: 0.57 mg/dL (ref 0.57–1.00)
GFR calc Af Amer: 118 mL/min/{1.73_m2} (ref >59)
GFR calc non Af Amer: 103 mL/min/{1.73_m2} (ref >59)
Globulin, Total: 1.8 g/dL (ref 1.5–4.5)
Glucose: 153 mg/dL — ABNORMAL HIGH (ref 65–99)
Potassium: 4.4 mmol/L (ref 3.5–5.2)
Sodium: 138 mmol/L (ref 134–144)
Total Protein: 5.8 g/dL — ABNORMAL LOW (ref 6.0–8.5)

## 2019-08-06 LAB — CBC
Hematocrit: 36.6 % (ref 34.0–46.6)
Hemoglobin: 12.1 g/dL (ref 11.1–15.9)
MCH: 26.2 pg — ABNORMAL LOW (ref 26.6–33.0)
MCHC: 33.1 g/dL (ref 31.5–35.7)
MCV: 79 fL (ref 79–97)
Platelets: 244 10*3/uL (ref 150–450)
RBC: 4.62 x10E6/uL (ref 3.77–5.28)
RDW: 16 % — ABNORMAL HIGH (ref 11.7–15.4)
WBC: 6.3 10*3/uL (ref 3.4–10.8)

## 2019-08-06 LAB — VITAMIN D 25 HYDROXY (VIT D DEFICIENCY, FRACTURES): Vit D, 25-Hydroxy: 34.8 ng/mL (ref 30.0–100.0)

## 2019-08-06 LAB — LIPID PANEL W/O CHOL/HDL RATIO
Cholesterol, Total: 179 mg/dL (ref 100–199)
HDL: 72 mg/dL (ref >39)
LDL Calculated: 93 mg/dL (ref 0–99)
Triglycerides: 71 mg/dL (ref 0–149)
VLDL Cholesterol Cal: 14 mg/dL (ref 5–40)

## 2019-08-06 LAB — T4, FREE: Free T4: 1.25 ng/dL (ref 0.82–1.77)

## 2019-08-06 LAB — TSH: TSH: 1.51 u[IU]/mL (ref 0.450–4.500)

## 2019-08-09 NOTE — Progress Notes (Signed)
Labs are good. Discuss at next visit

## 2019-08-12 ENCOUNTER — Ambulatory Visit
Admission: RE | Admit: 2019-08-12 | Discharge: 2019-08-12 | Disposition: A | Discharge status: Home / Self Care | Payer: BLUE CROSS/BLUE SHIELD | Source: Ambulatory Visit | Attending: Internal Medicine | Admitting: Internal Medicine

## 2019-08-12 DIAGNOSIS — Z1231 Encounter for screening mammogram for malignant neoplasm of breast: Secondary | ICD-10-CM | POA: Diagnosis not present

## 2019-08-19 ENCOUNTER — Other Ambulatory Visit: Payer: Self-pay

## 2019-08-19 DIAGNOSIS — I10 Essential (primary) hypertension: Secondary | ICD-10-CM

## 2019-08-19 DIAGNOSIS — E1165 Type 2 diabetes mellitus with hyperglycemia: Secondary | ICD-10-CM

## 2019-08-19 MED ORDER — GLYBURIDE-METFORMIN 5-500 MG PO TABS: 1.5000 | ORAL_TABLET | Freq: Two times a day (BID) | ORAL | 0 refills | Status: DC

## 2019-08-19 MED ORDER — BENAZEPRIL HCL 10 MG PO TABS: 10.0000 mg | ORAL_TABLET | Freq: Every day | ORAL | 1 refills | Status: DC

## 2019-09-30 ENCOUNTER — Other Ambulatory Visit: Payer: Self-pay

## 2019-09-30 DIAGNOSIS — Z20822 Contact with and (suspected) exposure to covid-19: Secondary | ICD-10-CM

## 2019-10-02 LAB — NOVEL CORONAVIRUS, NAA: SARS-CoV-2, NAA: NOT DETECTED

## 2019-10-28 ENCOUNTER — Other Ambulatory Visit: Payer: Self-pay

## 2019-10-28 ENCOUNTER — Encounter: Payer: Self-pay | Admitting: Nurse Practitioner

## 2019-10-28 ENCOUNTER — Ambulatory Visit: Payer: BLUE CROSS/BLUE SHIELD | Admitting: Nurse Practitioner

## 2019-10-28 VITALS — BP 125/84 | HR 71 | Temp 97.3°F | Resp 16 | Ht 66.0 in | Wt 150.0 lb

## 2019-10-28 DIAGNOSIS — E559 Vitamin D deficiency, unspecified: Secondary | ICD-10-CM

## 2019-10-28 DIAGNOSIS — M79672 Pain in left foot: Secondary | ICD-10-CM

## 2019-10-28 DIAGNOSIS — E1165 Type 2 diabetes mellitus with hyperglycemia: Secondary | ICD-10-CM | POA: Diagnosis not present

## 2019-10-28 DIAGNOSIS — M79671 Pain in right foot: Secondary | ICD-10-CM

## 2019-10-28 DIAGNOSIS — I10 Essential (primary) hypertension: Secondary | ICD-10-CM

## 2019-10-28 DIAGNOSIS — E119 Type 2 diabetes mellitus without complications: Secondary | ICD-10-CM

## 2019-10-28 LAB — POCT GLYCOSYLATED HEMOGLOBIN (HGB A1C): Hemoglobin A1C: 7.2 % — AB (ref 4.0–5.6)

## 2019-10-28 MED ORDER — ACCU-CHEK GUIDE VI STRP: ORAL_STRIP | 3 refills | Status: DC

## 2019-10-28 MED ORDER — ERGOCALCIFEROL 1.25 MG (50000 UT) PO CAPS: 50000.0000 [IU] | ORAL_CAPSULE | ORAL | 5 refills | Status: DC

## 2019-10-28 NOTE — Progress Notes (Signed)
[facility] [place], [place] [address]  Internal MEDICINE  Office Visit Note  Patient Name: Regina Short  [id number]  [id number]  Date of Service: 11/09/2019  Chief Complaint  Patient presents with  . Diabetes  . Hypertension  . Gastroesophageal Reflux  . Foot Pain    burning sensation on both feet, pt has to be up on her feet for 12 hours     Regina Short presents today for a routine diabetes follow-up. She reports that she works 12-hour shifts and is on her feet throughout her workday. She complains of burning pain in her bilateral plantar surfaces that is relieved by "staying off my feet." She also reports some numbness and sensations of cold in her feet. She denies any foot lesions or injuries. She also denies any burning, numbness, or tingling in anywhere other than her feet. Her HgB A1C (7.2) is slightly elevated from her last check (6.8). She reports that she has recently had steroid injections in her left knee and left wrist. She also states that she has been snacking more lately, but feels confident that she can cut back to get her HgB A1C back down. She hsa no other concerns or complaints today.       Current Medication: Outpatient Encounter Medications as of 10/28/2019  Medication Sig  . benazepril (LOTENSIN) 10 MG tablet Take 1 tablet (10 mg total) by mouth daily.  . fluticasone (FLONASE) 50 MCG/ACT nasal spray Place 2 sprays into both nostrils daily.  Marland Kitchen glucose blood (ACCU-CHEK GUIDE) test strip Check blood sugars BID and prn - E11.65  . glyBURIDE-metformin (GLUCOVANCE) 5-500 MG tablet Take 1.5 tablets by mouth 2 (two) times daily.  . Lancets Misc. (ACCU-CHEK FASTCLIX LANCET) KIT Accu-Chek Fastclix [employer]  . ONGLYZA 5 MG TABS tablet Take 1 tablet (5 mg total) by mouth daily.  Marland Kitchen triamcinolone (KENALOG) 0.025 % cream Apply 1 application topically 2 (two) times daily.  . [DISCONTINUED] glucose blood (ACCU-CHEK GUIDE) test strip Blood sugar  testing QAM and prn - E11.65  . ergocalciferol (DRISDOL) 1.25 MG (50000 UT) capsule Take 1 capsule (50,000 Units total) by mouth once a week.  . [DISCONTINUED] fluconazole (DIFLUCAN) 150 MG tablet Take 1 tablet po every other day for total of 5 doses (Patient not taking: Reported on 10/28/2019)   No facility-administered encounter medications on file as of 10/28/2019.     Surgical History: Past Surgical History:  Procedure Laterality Date  . ABDOMINAL HYSTERECTOMY    . ABDOMINAL SURGERY     5 tumors removed  . BREAST BIOPSY Right 02/02/15 core   focal fibroadenomatoid changes and sclerosis    Medical History: Past Medical History:  Diagnosis Date  . Chronic back pain   . Diabetes mellitus without complication ([place])   . GERD (gastroesophageal reflux disease)   . Hand tendonitis   . Hypertension     Family History: Family History  Problem Relation Age of Onset  . Congestive Heart Failure Other   . Breast cancer Maternal Aunt 60    Social History   Socioeconomic History  . Marital status: Single    Spouse name: Not on file  . Number of children: Not on file  . Years of education: Not on file  . Highest education level: Not on file  Occupational History  . Not on file  Social Needs  . Financial resource strain: Not on file  . Food insecurity    Worry: Not on file  Inability: Not on file  . Transportation needs    Medical: Not on file    Non-medical: Not on file  Tobacco Use  . Smoking status: Current Every Day Smoker    Packs/day: 0.50  . Smokeless tobacco: Never Used  Substance and Sexual Activity  . Alcohol use: Yes    Alcohol/week: 0.0 standard drinks    Comment: occassional drinks,  . Drug use: No  . Sexual activity: Yes    Birth control/protection: None  Lifestyle  . Physical activity    Days per week: Not on file    Minutes per session: Not on file  . Stress: Not on file  Relationships  . Social Herbalist on phone: Not on file     Gets together: Not on file    Attends religious service: Not on file    Active member of club or organization: Not on file    Attends meetings of clubs or organizations: Not on file    Relationship status: Not on file  . Intimate partner violence    Fear of current or ex partner: Not on file    Emotionally abused: Not on file    Physically abused: Not on file    Forced sexual activity: Not on file  Other Topics Concern  . Not on file  Social History Narrative  . Not on file      Review of Systems  Constitutional: Negative for activity change, chills, fatigue and unexpected weight change.  HENT: Negative for congestion, postnasal drip, rhinorrhea, sneezing and sore throat.   Respiratory: Negative for cough, chest tightness, shortness of breath and wheezing.   Cardiovascular: Negative for chest pain and palpitations.  Gastrointestinal: Negative for abdominal pain, constipation, diarrhea, nausea and vomiting.  Endocrine: Negative for cold intolerance, heat intolerance, polydipsia and polyuria.       Blood sugars have been slightly elevated over past few months.   Musculoskeletal: Positive for arthralgias and myalgias. Negative for back pain, joint swelling and neck pain.       Left knee pain and swelling .has seen orthopedics since her last visit. Knee pain is much improved.   Skin: Negative for rash.  Allergic/Immunologic: Negative for environmental allergies.  Neurological: Negative for dizziness, tremors, numbness and headaches.  Hematological: Negative for adenopathy. Does not bruise/bleed easily.  Psychiatric/Behavioral: Negative for behavioral problems (Depression), sleep disturbance and suicidal ideas. The patient is not nervous/anxious.     Today's Vitals   10/28/19 0834  BP: 125/84  Pulse: 71  Resp: 16  Temp: (!) 97.3 F (36.3 C)  SpO2: 98%  Weight: 150 lb (68 kg)  Height: '5\' 6"'  (1.676 m)   Body mass index is 24.21 kg/m.  Physical Exam Vitals signs and nursing  note reviewed.  Constitutional:      Appearance: Normal appearance.  HENT:     Head: Normocephalic and atraumatic.     Nose: Nose normal.  Eyes:     Extraocular Movements: Extraocular movements intact.     Pupils: Pupils are equal, round, and reactive to light.  Neck:     Musculoskeletal: Normal range of motion and neck supple.  Cardiovascular:     Rate and Rhythm: Normal rate and regular rhythm.     Pulses:          Dorsalis pedis pulses are 1+ on the right side and 2+ on the left side.       Posterior tibial pulses are 1+ on the right side and  2+ on the left side.     Heart sounds: Normal heart sounds.  Pulmonary:     Effort: Pulmonary effort is normal.     Breath sounds: Normal breath sounds.  Abdominal:     Palpations: Abdomen is soft.  Musculoskeletal:       Feet:  Feet:     Comments: Cool to touch Skin:    General: Skin is warm and dry.  Neurological:     Mental Status: She is alert and oriented to person, place, and time.  Psychiatric:        Mood and Affect: Mood normal.        Behavior: Behavior normal.        Thought Content: Thought content normal.        Judgment: Judgment normal.    Assessment/Plan:  1. Type 2 diabetes mellitus with hyperglycemia, without long-term current use of insulin (HCC) - POCT HgB A1C 7.2 today. No changes in medication today. Discussed decreasing carbohydrates and sugar in the diet and increasing routine exercise to help lower blood sugar without changing medication. monitor closely.   2. Diabetes mellitus without complication (HCC) - glucose blood (ACCU-CHEK GUIDE) test strip; Check blood sugars BID and prn - E11.65  Dispense: 100 each; Refill: 3  3. Essential hypertension Stable. Continue bp medication as prescribed   4. Vitamin D deficiency - ergocalciferol (DRISDOL) 1.25 MG (50000 UT) capsule; Take 1 capsule (50,000 Units total) by mouth once a week.  Dispense: 4 capsule; Refill: 5  5. Pain in both feet Tenderness with cool  sensation to touch at base of proximal joint of great toes. Recommend NSAIDs as needed and as prescribed. Rest, ice, and elevate the feet when possible. Continue to monitor closely    General Counseling: francess mullen understanding of the findings of todays visit and agrees with plan of treatment. I have discussed any further diagnostic evaluation that may be needed or ordered today. We also reviewed her medications today. she has been encouraged to call the office with any questions or concerns that should arise related to todays visit.   Diabetes Counseling:  1. Addition of ACE inh/ ARB'S for nephroprotection. Microalbumin is updated  2. Diabetic foot care, prevention of complications. Podiatry consult 3. Exercise and lose weight.  4. Diabetic eye examination, Diabetic eye exam is updated  5. Monitor blood sugar closlely. nutrition counseling.  6. Sign and symptoms of hypoglycemia including shaking sweating,confusion and headaches.  This patient was seen by Leretha Pol FNP Collaboration with Dr Lavera Guise as a part of collaborative care agreement  Orders Placed This Encounter  Procedures  . POCT HgB A1C    Meds ordered this encounter  Medications  . glucose blood (ACCU-CHEK GUIDE) test strip    Sig: Check blood sugars BID and prn - E11.65    Dispense:  100 each    Refill:  3    Order Specific Question:   Supervising Provider    Answer:   Lavera Guise [place]  . ergocalciferol (DRISDOL) 1.25 MG (50000 UT) capsule    Sig: Take 1 capsule (50,000 Units total) by mouth once a week.    Dispense:  4 capsule    Refill:  5    Order Specific Question:   Supervising Provider    Answer:   Lavera Guise [[id number]]    Time spent: 63 Minutes      Dr Lavera Guise Internal medicine

## 2019-11-09 DIAGNOSIS — M79671 Pain in right foot: Secondary | ICD-10-CM | POA: Insufficient documentation

## 2019-12-09 ENCOUNTER — Other Ambulatory Visit: Payer: Self-pay

## 2019-12-09 DIAGNOSIS — E1165 Type 2 diabetes mellitus with hyperglycemia: Secondary | ICD-10-CM

## 2019-12-09 MED ORDER — GLYBURIDE-METFORMIN 5-500 MG PO TABS: 1.5000 | ORAL_TABLET | Freq: Two times a day (BID) | ORAL | 0 refills | Status: DC

## 2020-01-02 ENCOUNTER — Other Ambulatory Visit: Payer: Self-pay

## 2020-01-02 DIAGNOSIS — E119 Type 2 diabetes mellitus without complications: Secondary | ICD-10-CM

## 2020-01-02 MED ORDER — ONGLYZA 5 MG PO TABS: 5.0000 mg | ORAL_TABLET | Freq: Every day | ORAL | 3 refills | Status: DC

## 2020-01-25 ENCOUNTER — Telehealth: Payer: Self-pay

## 2020-01-25 NOTE — Telephone Encounter (Signed)
CONFIRMED AND SCREENED FOR 01-27-20 OV. 

## 2020-01-27 ENCOUNTER — Encounter (INDEPENDENT_AMBULATORY_CARE_PROVIDER_SITE_OTHER): Payer: Self-pay

## 2020-01-27 ENCOUNTER — Encounter: Payer: Self-pay | Admitting: Nurse Practitioner

## 2020-01-27 ENCOUNTER — Other Ambulatory Visit: Payer: Self-pay

## 2020-01-27 ENCOUNTER — Ambulatory Visit (INDEPENDENT_AMBULATORY_CARE_PROVIDER_SITE_OTHER): Payer: 59 | Admitting: Nurse Practitioner

## 2020-01-27 VITALS — BP 124/89 | HR 74 | Temp 96.8°F | Resp 16 | Ht 66.0 in | Wt 145.4 lb

## 2020-01-27 DIAGNOSIS — I10 Essential (primary) hypertension: Secondary | ICD-10-CM

## 2020-01-27 DIAGNOSIS — R252 Cramp and spasm: Secondary | ICD-10-CM | POA: Diagnosis not present

## 2020-01-27 DIAGNOSIS — E559 Vitamin D deficiency, unspecified: Secondary | ICD-10-CM

## 2020-01-27 DIAGNOSIS — E1165 Type 2 diabetes mellitus with hyperglycemia: Secondary | ICD-10-CM | POA: Diagnosis not present

## 2020-01-27 LAB — POCT GLYCOSYLATED HEMOGLOBIN (HGB A1C): Hemoglobin A1C: 6.6 % — AB (ref 4.0–5.6)

## 2020-01-27 MED ORDER — ERGOCALCIFEROL 1.25 MG (50000 UT) PO CAPS: 50000.0000 [IU] | ORAL_CAPSULE | ORAL | 5 refills | Status: DC

## 2020-01-27 NOTE — Progress Notes (Signed)
[facility] [place], [place] [address]  Internal MEDICINE  Office Visit Note  Patient Name: Regina Short  [id number]  [id number]  Date of Service: 01/29/2020  Chief Complaint  Patient presents with  . Diabetes  . Gastroesophageal Reflux  . Hypertension  . Leg Problem    both leg cramps     The patient is here for follow up visit. Blood sugars are doing well. HgbA1c 6.6 today, dowen from 7.3 at the most recent check. She has been carefully watching her diet and exercising more frequently. She does continue to get cramping in her legs, mostly at night and after working for long periods of time. She spends most of her shifts on her feet. The more she is on her feet, the more cramping she feels in her legs. She has no other concerns or complaints today. Blood pressure is well controlled.       Current Medication: Outpatient Encounter Medications as of 01/27/2020  Medication Sig  . benazepril (LOTENSIN) 10 MG tablet Take 1 tablet (10 mg total) by mouth daily.  . ergocalciferol (DRISDOL) 1.25 MG (50000 UT) capsule Take 1 capsule (50,000 Units total) by mouth once a week.  . fluticasone (FLONASE) 50 MCG/ACT nasal spray Place 2 sprays into both nostrils daily.  Marland Kitchen glucose blood (ACCU-CHEK GUIDE) test strip Check blood sugars BID and prn - E11.65  . glyBURIDE-metformin (GLUCOVANCE) 5-500 MG tablet Take 1.5 tablets by mouth 2 (two) times daily.  . Lancets Misc. (ACCU-CHEK FASTCLIX LANCET) KIT Accu-Chek Fastclix [employer]  . ONGLYZA 5 MG TABS tablet Take 1 tablet (5 mg total) by mouth daily.  Marland Kitchen triamcinolone (KENALOG) 0.025 % cream Apply 1 application topically 2 (two) times daily.  . [DISCONTINUED] ergocalciferol (DRISDOL) 1.25 MG (50000 UT) capsule Take 1 capsule (50,000 Units total) by mouth once a week.   No facility-administered encounter medications on file as of 01/27/2020.    Surgical History: Past Surgical History:  Procedure Laterality Date  .  ABDOMINAL HYSTERECTOMY    . ABDOMINAL SURGERY     5 tumors removed  . BREAST BIOPSY Right 02/02/15 core   focal fibroadenomatoid changes and sclerosis    Medical History: Past Medical History:  Diagnosis Date  . Chronic back pain   . Diabetes mellitus without complication ([place])   . GERD (gastroesophageal reflux disease)   . Hand tendonitis   . Hypertension     Family History: Family History  Problem Relation Age of Onset  . Congestive Heart Failure Other   . Breast cancer Maternal Aunt 60    Social History   Socioeconomic History  . Marital status: Single    Spouse name: Not on file  . Number of children: Not on file  . Years of education: Not on file  . Highest education level: Not on file  Occupational History  . Not on file  Tobacco Use  . Smoking status: Current Every Day Smoker    Packs/day: 0.50  . Smokeless tobacco: Never Used  . Tobacco comment: pack of cigarettes last two days  Substance and Sexual Activity  . Alcohol use: Yes    Alcohol/week: 0.0 standard drinks    Comment: occassional drinks,  . Drug use: No  . Sexual activity: Yes    Birth control/protection: None  Other Topics Concern  . Not on file  Social History Narrative  . Not on file   Social Determinants of Health   Financial Resource Strain:   . Difficulty  of Paying Living Expenses: Not on file  Food Insecurity:   . Worried About Charity fundraiser in the Last Year: Not on file  . Ran Out of Food in the Last Year: Not on file  Transportation Needs:   . Lack of Transportation (Medical): Not on file  . Lack of Transportation (Non-Medical): Not on file  Physical Activity:   . Days of Exercise per Week: Not on file  . Minutes of Exercise per Session: Not on file  Stress:   . Feeling of Stress : Not on file  Social Connections:   . Frequency of Communication with Friends and Family: Not on file  . Frequency of Social Gatherings with Friends and Family: Not on file  . Attends  Religious Services: Not on file  . Active Member of Clubs or Organizations: Not on file  . Attends Archivist Meetings: Not on file  . Marital Status: Not on file  Intimate Partner Violence:   . Fear of Current or Ex-Partner: Not on file  . Emotionally Abused: Not on file  . Physically Abused: Not on file  . Sexually Abused: Not on file      Review of Systems  Constitutional: Negative for activity change, chills, fatigue and unexpected weight change.  HENT: Negative for congestion, postnasal drip, rhinorrhea, sneezing and sore throat.   Respiratory: Negative for cough, chest tightness, shortness of breath and wheezing.   Cardiovascular: Negative for chest pain and palpitations.  Gastrointestinal: Negative for abdominal pain, constipation, diarrhea, nausea and vomiting.  Endocrine: Negative for cold intolerance, heat intolerance, polydipsia and polyuria.       Improved blood sugars these last few months   Musculoskeletal: Positive for arthralgias and myalgias. Negative for back pain, joint swelling and neck pain.       Some cramping and pain in both lower extremities, mostly at night and mostly after long shifts on her feet.   Skin: Negative for rash.  Allergic/Immunologic: Negative for environmental allergies.  Neurological: Negative for dizziness, tremors, numbness and headaches.  Hematological: Negative for adenopathy. Does not bruise/bleed easily.  Psychiatric/Behavioral: Negative for behavioral problems (Depression), sleep disturbance and suicidal ideas. The patient is not nervous/anxious.     Today's Vitals   01/27/20 0942  BP: 124/89  Pulse: 74  Resp: 16  Temp: (!) 96.8 F (36 C)  SpO2: 97%  Weight: 145 lb 6.4 oz (66 kg)  Height: '5\' 6"'  (1.676 m)   Body mass index is 23.47 kg/m.  Physical Exam Vitals and nursing note reviewed.  Constitutional:      General: She is not in acute distress.    Appearance: Normal appearance. She is well-developed. She is not  diaphoretic.  HENT:     Head: Normocephalic and atraumatic.     Nose: Nose normal.     Mouth/Throat:     Pharynx: No oropharyngeal exudate.  Eyes:     Pupils: Pupils are equal, round, and reactive to light.  Neck:     Thyroid: No thyromegaly.     Vascular: No carotid bruit or JVD.     Trachea: No tracheal deviation.  Cardiovascular:     Rate and Rhythm: Normal rate and regular rhythm.     Heart sounds: Normal heart sounds. No murmur. No friction rub. No gallop.      Comments: Varicose veins present bilateral lower extremities.  Pulmonary:     Effort: Pulmonary effort is normal. No respiratory distress.     Breath sounds: Normal breath  sounds. No wheezing or rales.  Chest:     Chest wall: No tenderness.  Abdominal:     Palpations: Abdomen is soft.  Musculoskeletal:        General: Normal range of motion.     Cervical back: Normal range of motion and neck supple.  Lymphadenopathy:     Cervical: No cervical adenopathy.  Skin:    General: Skin is warm and dry.  Neurological:     Mental Status: She is alert and oriented to person, place, and time.     Cranial Nerves: No cranial nerve deficit.  Psychiatric:        Behavior: Behavior normal.        Thought Content: Thought content normal.        Judgment: Judgment normal.    Assessment/Plan: 1. Type 2 diabetes mellitus with hyperglycemia, without long-term current use of insulin (HCC)   - POCT HgbA1C 6.6 today. Continue diabetic medication as prescribed.   2. Vitamin D deficiency May continue to take Drisdol once weekly. Refills provided - ergocalciferol (DRISDOL) 1.25 MG (50000 UT) capsule; Take 1 capsule (50,000 Units total) by mouth once a week.  Dispense: 4 capsule; Refill: 5  3. Essential hypertension Stable. Continue bp medication as prescribed   4. Leg cramps Electrolyte check normal. Ensure proper water intake during the day. Rest and elevate legs at the end of shifts and long days on feet. Will continue to  monitor.   General Counseling: xaria judon understanding of the findings of todays visit and agrees with plan of treatment. I have discussed any further diagnostic evaluation that may be needed or ordered today. We also reviewed her medications today. she has been encouraged to call the office with any questions or concerns that should arise related to todays visit.  Diabetes Counseling:  1. Addition of ACE inh/ ARB'S for nephroprotection. Microalbumin is updated  2. Diabetic foot care, prevention of complications. Podiatry consult 3. Exercise and lose weight.  4. Diabetic eye examination, Diabetic eye exam is updated  5. Monitor blood sugar closlely. nutrition counseling.  6. Sign and symptoms of hypoglycemia including shaking sweating,confusion and headaches.  This patient was seen by Leretha Pol FNP Collaboration with Dr Lavera Guise as a part of collaborative care agreement  Orders Placed This Encounter  Procedures  . POCT HgB A1C    Meds ordered this encounter  Medications  . ergocalciferol (DRISDOL) 1.25 MG (50000 UT) capsule    Sig: Take 1 capsule (50,000 Units total) by mouth once a week.    Dispense:  4 capsule    Refill:  5    Order Specific Question:   Supervising Provider    Answer:   Lavera Guise [[id number]]    Total time spent: 30 Minutes   Time spent includes review of chart, medications, test results, and follow up plan with the patient.      Dr Lavera Guise Internal medicine

## 2020-01-29 DIAGNOSIS — R252 Cramp and spasm: Secondary | ICD-10-CM | POA: Insufficient documentation

## 2020-01-30 ENCOUNTER — Other Ambulatory Visit: Payer: Self-pay | Admitting: Internal Medicine

## 2020-01-31 NOTE — Telephone Encounter (Signed)
During her visit, I filled her prescription for drisdol 50000iu weekly.

## 2020-03-27 ENCOUNTER — Other Ambulatory Visit: Payer: Self-pay

## 2020-03-27 DIAGNOSIS — E119 Type 2 diabetes mellitus without complications: Secondary | ICD-10-CM

## 2020-03-27 MED ORDER — ACCU-CHEK GUIDE VI STRP: ORAL_STRIP | 3 refills | Status: DC

## 2020-04-06 ENCOUNTER — Ambulatory Visit: Payer: 59 | Admitting: Nurse Practitioner

## 2020-04-19 ENCOUNTER — Encounter: Payer: Self-pay | Admitting: Nurse Practitioner

## 2020-04-19 ENCOUNTER — Other Ambulatory Visit: Payer: Self-pay

## 2020-04-19 ENCOUNTER — Ambulatory Visit (INDEPENDENT_AMBULATORY_CARE_PROVIDER_SITE_OTHER): Payer: 59 | Admitting: Nurse Practitioner

## 2020-04-19 VITALS — BP 142/91 | HR 78 | Temp 97.7°F | Resp 16 | Ht 66.0 in | Wt 142.8 lb

## 2020-04-19 DIAGNOSIS — M25562 Pain in left knee: Secondary | ICD-10-CM

## 2020-04-19 DIAGNOSIS — M79672 Pain in left foot: Secondary | ICD-10-CM

## 2020-04-19 DIAGNOSIS — I10 Essential (primary) hypertension: Secondary | ICD-10-CM | POA: Diagnosis not present

## 2020-04-19 DIAGNOSIS — E559 Vitamin D deficiency, unspecified: Secondary | ICD-10-CM

## 2020-04-19 DIAGNOSIS — E1165 Type 2 diabetes mellitus with hyperglycemia: Secondary | ICD-10-CM

## 2020-04-19 DIAGNOSIS — M79671 Pain in right foot: Secondary | ICD-10-CM

## 2020-04-19 DIAGNOSIS — M6530 Trigger finger, unspecified finger: Secondary | ICD-10-CM

## 2020-04-19 DIAGNOSIS — E119 Type 2 diabetes mellitus without complications: Secondary | ICD-10-CM

## 2020-04-19 DIAGNOSIS — G8929 Other chronic pain: Secondary | ICD-10-CM

## 2020-04-19 LAB — POCT GLYCOSYLATED HEMOGLOBIN (HGB A1C): Hemoglobin A1C: 7.4 % — AB (ref 4.0–5.6)

## 2020-04-19 MED ORDER — ACCU-CHEK GUIDE VI STRP: ORAL_STRIP | 3 refills | Status: DC

## 2020-04-19 MED ORDER — GLYBURIDE-METFORMIN 5-500 MG PO TABS: 1.5000 | ORAL_TABLET | Freq: Two times a day (BID) | ORAL | 3 refills | Status: DC

## 2020-04-19 MED ORDER — ERGOCALCIFEROL 1.25 MG (50000 UT) PO CAPS: 50000.0000 [IU] | ORAL_CAPSULE | ORAL | 5 refills | Status: DC

## 2020-04-19 NOTE — Progress Notes (Signed)
[facility] [place], [place] [address]  Internal MEDICINE  Office Visit Note  Patient Name: Regina Short  [id number]  [id number]  Date of Service: 05/02/2020  Chief Complaint  Patient presents with  . Diabetes  . Gastroesophageal Reflux  . Hypertension  . Referral    needs a doctor for hands and feet due to switching insurance cant see current doctor   . Medication Refill    glyburide metformin, accuchek guide strips    The patient is here for routine follow up. Blood sugars have been elevated recently. Her HgbA1c is 7.4 today. She states that her diet choices have not been great recently. Has been eating a lot of comfort foods and foods higher in sugar and carbohydrates. Blood pressure is well managed. She states that she does need to have a new referral to orthopedics. The provider she has been seeing orthopedic provider is no longer covered by her insurance. She has chronic arthritic issues in her hands, fingers, and knees.       Current Medication: Outpatient Encounter Medications as of 04/19/2020  Medication Sig  . benazepril (LOTENSIN) 10 MG tablet Take 1 tablet (10 mg total) by mouth daily.  . ergocalciferol (DRISDOL) 1.25 MG (50000 UT) capsule Take 1 capsule (50,000 Units total) by mouth once a week.  . fluticasone (FLONASE) 50 MCG/ACT nasal spray Place 2 sprays into both nostrils daily.  Marland Kitchen glucose blood (ACCU-CHEK GUIDE) test strip Check blood sugars BID and prn - E11.65  . glyBURIDE-metformin (GLUCOVANCE) 5-500 MG tablet Take 1.5 tablets by mouth 2 (two) times daily.  . Lancets Misc. (ACCU-CHEK FASTCLIX LANCET) KIT Accu-Chek Fastclix [employer]  . ONGLYZA 5 MG TABS tablet Take 1 tablet (5 mg total) by mouth daily.  Marland Kitchen triamcinolone (KENALOG) 0.025 % cream Apply 1 application topically 2 (two) times daily.  . [DISCONTINUED] ergocalciferol (DRISDOL) 1.25 MG (50000 UT) capsule Take 1 capsule (50,000 Units total) by mouth once a week.  .  [DISCONTINUED] glucose blood (ACCU-CHEK GUIDE) test strip Check blood sugars BID and prn - E11.65  . [DISCONTINUED] glyBURIDE-metformin (GLUCOVANCE) 5-500 MG tablet Take 1.5 tablets by mouth 2 (two) times daily.   No facility-administered encounter medications on file as of 04/19/2020.    Surgical History: Past Surgical History:  Procedure Laterality Date  . ABDOMINAL HYSTERECTOMY    . ABDOMINAL SURGERY     5 tumors removed  . BREAST BIOPSY Right 02/02/15 core   focal fibroadenomatoid changes and sclerosis    Medical History: Past Medical History:  Diagnosis Date  . Chronic back pain   . Diabetes mellitus without complication ([place])   . GERD (gastroesophageal reflux disease)   . Hand tendonitis   . Hypertension     Family History: Family History  Problem Relation Age of Onset  . Congestive Heart Failure Other   . Breast cancer Maternal Aunt 60    Social History   Socioeconomic History  . Marital status: Single    Spouse name: Not on file  . Number of children: Not on file  . Years of education: Not on file  . Highest education level: Not on file  Occupational History  . Not on file  Tobacco Use  . Smoking status: Current Every Day Smoker    Packs/day: 0.50  . Smokeless tobacco: Never Used  . Tobacco comment: pack of cigarettes last two days  Substance and Sexual Activity  . Alcohol use: Yes    Alcohol/week: 0.0 standard drinks  Comment: occassional drinks,  . Drug use: No  . Sexual activity: Yes    Birth control/protection: None  Other Topics Concern  . Not on file  Social History Narrative  . Not on file   Social Determinants of Health   Financial Resource Strain:   . Difficulty of Paying Living Expenses:   Food Insecurity:   . Worried About Charity fundraiser in the Last Year:   . Arboriculturist in the Last Year:   Transportation Needs:   . Film/video editor (Medical):   Marland Kitchen Lack of Transportation (Non-Medical):   Physical Activity:   .  Days of Exercise per Week:   . Minutes of Exercise per Session:   Stress:   . Feeling of Stress :   Social Connections:   . Frequency of Communication with Friends and Family:   . Frequency of Social Gatherings with Friends and Family:   . Attends Religious Services:   . Active Member of Clubs or Organizations:   . Attends Archivist Meetings:   Marland Kitchen Marital Status:   Intimate Partner Violence:   . Fear of Current or Ex-Partner:   . Emotionally Abused:   Marland Kitchen Physically Abused:   . Sexually Abused:       Review of Systems  Constitutional: Negative for activity change, chills, fatigue and unexpected weight change.  HENT: Negative for congestion, postnasal drip, rhinorrhea, sneezing and sore throat.   Respiratory: Negative for cough, chest tightness, shortness of breath and wheezing.   Cardiovascular: Negative for chest pain and palpitations.  Gastrointestinal: Negative for abdominal pain, constipation, diarrhea, nausea and vomiting.  Endocrine: Negative for cold intolerance, heat intolerance, polydipsia and polyuria.       The blood sugars have been higher over the past few months.   Musculoskeletal: Positive for arthralgias and myalgias. Negative for back pain, joint swelling and neck pain.       Some cramping and pain in both lower extremities, mostly at night and mostly after long shifts on her feet. Has arthritic issues in the joints of her fingers of both hands.   Skin: Negative for rash.  Allergic/Immunologic: Negative for environmental allergies.  Neurological: Negative for dizziness, tremors, numbness and headaches.  Hematological: Negative for adenopathy. Does not bruise/bleed easily.  Psychiatric/Behavioral: Negative for behavioral problems (Depression), sleep disturbance and suicidal ideas. The patient is not nervous/anxious.     Today's Vitals   04/19/20 1547  BP: (!) 142/91  Pulse: 78  Resp: 16  Temp: 97.7 F (36.5 C)  SpO2: 99%  Weight: 142 lb 12.8 oz  (64.8 kg)  Height: 5' 6" (1.676 m)   Body mass index is 23.05 kg/m.  Physical Exam Vitals and nursing note reviewed.  Constitutional:      General: She is not in acute distress.    Appearance: Normal appearance. She is well-developed. She is not diaphoretic.  HENT:     Head: Normocephalic and atraumatic.     Nose: Nose normal.     Mouth/Throat:     Pharynx: No oropharyngeal exudate.  Eyes:     Pupils: Pupils are equal, round, and reactive to light.  Neck:     Thyroid: No thyromegaly.     Vascular: No carotid bruit or JVD.     Trachea: No tracheal deviation.  Cardiovascular:     Rate and Rhythm: Normal rate and regular rhythm.     Heart sounds: Normal heart sounds. No murmur. No friction rub. No gallop.  Comments: Varicose veins present bilateral lower extremities.  Pulmonary:     Effort: Pulmonary effort is normal. No respiratory distress.     Breath sounds: Normal breath sounds. No wheezing or rales.  Chest:     Chest wall: No tenderness.  Abdominal:     Palpations: Abdomen is soft.  Musculoskeletal:        General: Normal range of motion.     Cervical back: Normal range of motion and neck supple.     Comments: Arthritic changes of the fingers of both hands. Has trigger finger abnormality present in medial joints of multiple fingers. She also has arthritic changes in both knees.   Lymphadenopathy:     Cervical: No cervical adenopathy.  Skin:    General: Skin is warm and dry.  Neurological:     Mental Status: She is alert and oriented to person, place, and time.     Cranial Nerves: No cranial nerve deficit.  Psychiatric:        Mood and Affect: Mood normal.        Behavior: Behavior normal.        Thought Content: Thought content normal.        Judgment: Judgment normal.    Assessment/Plan:  1. Type 2 diabetes mellitus with hyperglycemia, without long-term current use of insulin (HCC) - POCT HgB A1C 7.4 today. No changes made in medications. Discussion about  importance of following low carbohydrate, low-sugar diet, and increasing protein. Monitor blood sugars daily. Recheck HgbA1c at next visit and adjust medication as indicated.  - glyBURIDE-metformin (GLUCOVANCE) 5-500 MG tablet; Take 1.5 tablets by mouth 2 (two) times daily.  Dispense: 270 tablet; Refill: 3 - glucose blood (ACCU-CHEK GUIDE) test strip; Check blood sugars BID and prn - E11.65  Dispense: 100 each; Refill: 3  2. Essential hypertension Stable. Continue bp medication as prescribed   3. Acquired trigger finger Will refer to orthopedic provider preferred by her insurance for continued evaluation and treatment.  - Ambulatory referral to Orthopedic Surgery  4. Chronic pain of left knee Will refer to orthopedic provider preferred by her insurance for continued evaluation and treatment.  - Ambulatory referral to Orthopedic Surgery  5. Pain in both feet Will refer to orthopedic provider preferred by her insurance for continued evaluation and treatment.  - Ambulatory referral to Orthopedic Surgery  6. Vitamin D deficiency Continue drisdol 50000 iu weekly.  - ergocalciferol (DRISDOL) 1.25 MG (50000 UT) capsule; Take 1 capsule (50,000 Units total) by mouth once a week.  Dispense: 4 capsule; Refill: 5   General Counseling: Regina Short verbalizes understanding of the findings of todays visit and agrees with plan of treatment. I have discussed any further diagnostic evaluation that may be needed or ordered today. We also reviewed her medications today. she has been encouraged to call the office with any questions or concerns that should arise related to todays visit.  Diabetes Counseling:  1. Addition of ACE inh/ ARB'S for nephroprotection. Microalbumin is updated  2. Diabetic foot care, prevention of complications. Podiatry consult 3. Exercise and lose weight.  4. Diabetic eye examination, Diabetic eye exam is updated  5. Monitor blood sugar closlely. nutrition counseling.  6. Sign and  symptoms of hypoglycemia including shaking sweating,confusion and headaches.  This patient was seen by Leretha Pol FNP Collaboration with Dr Lavera Guise as a part of collaborative care agreement  Orders Placed This Encounter  Procedures  . Ambulatory referral to Orthopedic Surgery  . POCT HgB A1C  Meds ordered this encounter  Medications  . glyBURIDE-metformin (GLUCOVANCE) 5-500 MG tablet    Sig: Take 1.5 tablets by mouth 2 (two) times daily.    Dispense:  270 tablet    Refill:  3    Order Specific Question:   Supervising Provider    Answer:   Lavera Guise [[id number]]  . ergocalciferol (DRISDOL) 1.25 MG (50000 UT) capsule    Sig: Take 1 capsule (50,000 Units total) by mouth once a week.    Dispense:  4 capsule    Refill:  5    Order Specific Question:   Supervising Provider    Answer:   Lavera Guise [[id number]]  . glucose blood (ACCU-CHEK GUIDE) test strip    Sig: Check blood sugars BID and prn - E11.65    Dispense:  100 each    Refill:  3    Order Specific Question:   Supervising Provider    Answer:   Lavera Guise [[id number]]    Total time spent: 87 Minutes   Time spent includes review of chart, medications, test results, and follow up plan with the patient.      Dr Lavera Guise Internal medicine

## 2020-04-20 ENCOUNTER — Ambulatory Visit: Payer: 59 | Admitting: Nurse Practitioner

## 2020-06-15 ENCOUNTER — Telehealth: Payer: Self-pay

## 2020-06-15 NOTE — Telephone Encounter (Signed)
PT is taking Glyburide and gets refills on time

## 2020-06-22 ENCOUNTER — Other Ambulatory Visit: Payer: Self-pay | Admitting: Nurse Practitioner

## 2020-06-23 LAB — COMPREHENSIVE METABOLIC PANEL
ALT: 9 IU/L (ref 0–32)
AST: 6 IU/L (ref 0–40)
Albumin/Globulin Ratio: 1.9 (ref 1.2–2.2)
Albumin: 4 g/dL (ref 3.8–4.9)
Alkaline Phosphatase: 108 IU/L (ref 48–121)
BUN/Creatinine Ratio: 31 — ABNORMAL HIGH (ref 9–23)
BUN: 18 mg/dL (ref 6–24)
Bilirubin Total: 0.3 mg/dL (ref 0.0–1.2)
CO2: 24 mmol/L (ref 20–29)
Calcium: 9.5 mg/dL (ref 8.7–10.2)
Chloride: 99 mmol/L (ref 96–106)
Creatinine, Ser: 0.58 mg/dL (ref 0.57–1.00)
GFR calc Af Amer: 117 mL/min/{1.73_m2} (ref >59)
GFR calc non Af Amer: 101 mL/min/{1.73_m2} (ref >59)
Globulin, Total: 2.1 g/dL (ref 1.5–4.5)
Glucose: 203 mg/dL — ABNORMAL HIGH (ref 65–99)
Potassium: 5 mmol/L (ref 3.5–5.2)
Sodium: 136 mmol/L (ref 134–144)
Total Protein: 6.1 g/dL (ref 6.0–8.5)

## 2020-06-23 LAB — LIPID PANEL WITH LDL/HDL RATIO
Cholesterol, Total: 177 mg/dL (ref 100–199)
HDL: 73 mg/dL (ref >39)
LDL Chol Calc (NIH): 86 mg/dL (ref 0–99)
LDL/HDL Ratio: 1.2 ratio (ref 0.0–3.2)
Triglycerides: 98 mg/dL (ref 0–149)
VLDL Cholesterol Cal: 18 mg/dL (ref 5–40)

## 2020-06-23 LAB — CBC
Hematocrit: 40.7 % (ref 34.0–46.6)
Hemoglobin: 13.4 g/dL (ref 11.1–15.9)
MCH: 25.9 pg — ABNORMAL LOW (ref 26.6–33.0)
MCHC: 32.9 g/dL (ref 31.5–35.7)
MCV: 79 fL (ref 79–97)
Platelets: 265 10*3/uL (ref 150–450)
RBC: 5.18 x10E6/uL (ref 3.77–5.28)
RDW: 16.3 % — ABNORMAL HIGH (ref 11.7–15.4)
WBC: 6.7 10*3/uL (ref 3.4–10.8)

## 2020-06-23 LAB — VITAMIN D 25 HYDROXY (VIT D DEFICIENCY, FRACTURES): Vit D, 25-Hydroxy: 48.3 ng/mL (ref 30.0–100.0)

## 2020-06-23 LAB — TSH: TSH: 1.3 u[IU]/mL (ref 0.450–4.500)

## 2020-06-23 LAB — HEPATITIS C ANTIBODY: Hep C Virus Ab: <0.1 s/co ratio (ref 0.0–0.9)

## 2020-06-23 LAB — T4, FREE: Free T4: 1.45 ng/dL (ref 0.82–1.77)

## 2020-06-27 NOTE — Progress Notes (Signed)
Labs look good. Discuss with patient 07/20/2020

## 2020-07-20 ENCOUNTER — Other Ambulatory Visit: Payer: 59 | Admitting: Nurse Practitioner

## 2020-07-27 ENCOUNTER — Other Ambulatory Visit: Payer: 59 | Admitting: Nurse Practitioner

## 2020-09-28 ENCOUNTER — Ambulatory Visit (INDEPENDENT_AMBULATORY_CARE_PROVIDER_SITE_OTHER): Payer: 59 | Admitting: Nurse Practitioner

## 2020-09-28 ENCOUNTER — Encounter: Payer: Self-pay | Admitting: Nurse Practitioner

## 2020-09-28 VITALS — BP 142/82 | HR 73 | Temp 98.2°F | Resp 16 | Ht 66.0 in | Wt 139.2 lb

## 2020-09-28 DIAGNOSIS — Z124 Encounter for screening for malignant neoplasm of cervix: Secondary | ICD-10-CM

## 2020-09-28 DIAGNOSIS — Z0001 Encounter for general adult medical examination with abnormal findings: Secondary | ICD-10-CM

## 2020-09-28 DIAGNOSIS — I10 Essential (primary) hypertension: Secondary | ICD-10-CM | POA: Diagnosis not present

## 2020-09-28 DIAGNOSIS — Z23 Encounter for immunization: Secondary | ICD-10-CM

## 2020-09-28 DIAGNOSIS — I8311 Varicose veins of right lower extremity with inflammation: Secondary | ICD-10-CM

## 2020-09-28 DIAGNOSIS — E1165 Type 2 diabetes mellitus with hyperglycemia: Secondary | ICD-10-CM | POA: Diagnosis not present

## 2020-09-28 DIAGNOSIS — I8312 Varicose veins of left lower extremity with inflammation: Secondary | ICD-10-CM

## 2020-09-28 DIAGNOSIS — Z1231 Encounter for screening mammogram for malignant neoplasm of breast: Secondary | ICD-10-CM

## 2020-09-28 DIAGNOSIS — R300 Dysuria: Secondary | ICD-10-CM

## 2020-09-28 LAB — POCT GLYCOSYLATED HEMOGLOBIN (HGB A1C): Hemoglobin A1C: 7.3 % — AB (ref 4.0–5.6)

## 2020-09-28 MED ORDER — GLYBURIDE-METFORMIN 5-500 MG PO TABS: ORAL_TABLET | ORAL | 1 refills | Status: DC

## 2020-09-28 MED ORDER — BENAZEPRIL HCL 10 MG PO TABS: 10.0000 mg | ORAL_TABLET | Freq: Every day | ORAL | 3 refills | Status: DC

## 2020-09-28 NOTE — Progress Notes (Signed)
[facility] [place], [place] [address]  Internal MEDICINE  Office Visit Note  Patient Name: Regina Short  [id number]  [id number]  Date of Service: 10/14/2020   Pt is here for routine health maintenance examination   Chief Complaint  Patient presents with  . Annual Exam    leg cramps, look like bruises, varicose veins  . Diabetes  . Hypertension  . Quality Metric Gaps    TDAP, eye exam, pap     The patient is here for health maintenance exam and pap smear. The patient states that she has been having trouble with cramping in her lower legs. She has moderate varicose veins. She stands a lot at work. Does not wear compression stockings at all. She does have varicose veins.  Blood sugar a little elevated. HgbA1c 7.3 today. Currently taking 1.5 tablets of glucovance BID.  Blood pressure is generally well managed. Due to have mammogram  Would like to have flu shot while she is here today.    Current Medication: Outpatient Encounter Medications as of 09/28/2020  Medication Sig  . benazepril (LOTENSIN) 10 MG tablet Take 1 tablet (10 mg total) by mouth daily.  . ergocalciferol (DRISDOL) 1.25 MG (50000 UT) capsule Take 1 capsule (50,000 Units total) by mouth once a week.  . fluticasone (FLONASE) 50 MCG/ACT nasal spray Place 2 sprays into both nostrils daily.  Marland Kitchen glucose blood (ACCU-CHEK GUIDE) test strip Check blood sugars BID and prn - E11.65  . glyBURIDE-metformin (GLUCOVANCE) 5-500 MG tablet Take 2 tablets po BID for DM2  . Lancets Misc. (ACCU-CHEK FASTCLIX LANCET) KIT Accu-Chek Fastclix [employer]  . ONGLYZA 5 MG TABS tablet Take 1 tablet (5 mg total) by mouth daily.  . [DISCONTINUED] benazepril (LOTENSIN) 10 MG tablet Take 1 tablet (10 mg total) by mouth daily.  . [DISCONTINUED] glyBURIDE-metformin (GLUCOVANCE) 5-500 MG tablet Take 1.5 tablets by mouth 2 (two) times daily.  . [DISCONTINUED] triamcinolone (KENALOG) 0.025 % cream Apply 1 application  topically 2 (two) times daily. (Patient not taking: Reported on 09/28/2020)   No facility-administered encounter medications on file as of 09/28/2020.    Surgical History: Past Surgical History:  Procedure Laterality Date  . ABDOMINAL HYSTERECTOMY    . ABDOMINAL SURGERY     5 tumors removed  . BREAST BIOPSY Right 02/02/15 core   focal fibroadenomatoid changes and sclerosis    Medical History: Past Medical History:  Diagnosis Date  . Chronic back pain   . Diabetes mellitus without complication ([place])   . GERD (gastroesophageal reflux disease)   . Hand tendonitis   . Hypertension     Family History: Family History  Problem Relation Age of Onset  . Congestive Heart Failure Other   . Breast cancer Maternal Aunt 60      Review of Systems  Constitutional: Negative for activity change, chills, fatigue and unexpected weight change.  HENT: Negative for congestion, postnasal drip, rhinorrhea, sneezing and sore throat.   Respiratory: Negative for cough, chest tightness, shortness of breath and wheezing.   Cardiovascular: Positive for leg swelling. Negative for chest pain and palpitations.       Varicose veins present.  Gastrointestinal: Negative for abdominal pain, constipation, diarrhea, nausea and vomiting.  Endocrine: Negative for cold intolerance, heat intolerance, polydipsia and polyuria.       The blood sugars have been higher over the past few months.   Genitourinary: Negative for dysuria, frequency, hematuria and urgency.  Musculoskeletal: Positive for arthralgias and myalgias. Negative  for back pain, joint swelling and neck pain.       Some cramping and pain in both lower extremities, mostly at night and mostly after long shifts on her feet. Has arthritic issues in the joints of her fingers of both hands.   Skin: Negative for rash.  Allergic/Immunologic: Negative for environmental allergies.  Neurological: Negative for dizziness, tremors, numbness and headaches.   Hematological: Negative for adenopathy. Does not bruise/bleed easily.  Psychiatric/Behavioral: Negative for behavioral problems (Depression), sleep disturbance and suicidal ideas. The patient is not nervous/anxious.      Today's Vitals   09/28/20 0916  BP: (!) 142/82  Pulse: 73  Resp: 16  Temp: 98.2 F (36.8 C)  SpO2: 98%  Weight: 139 lb 3.2 oz (63.1 kg)  Height: '5\' 6"'  (1.676 m)   Body mass index is 22.47 kg/m.  Physical Exam Vitals and nursing note reviewed.  Constitutional:      General: She is not in acute distress.    Appearance: Normal appearance. She is well-developed. She is not diaphoretic.  HENT:     Head: Normocephalic and atraumatic.     Nose: Nose normal.     Mouth/Throat:     Pharynx: No oropharyngeal exudate.  Eyes:     Pupils: Pupils are equal, round, and reactive to light.  Neck:     Thyroid: No thyromegaly.     Vascular: No carotid bruit or JVD.     Trachea: No tracheal deviation.  Cardiovascular:     Rate and Rhythm: Normal rate and regular rhythm.     Pulses: Normal pulses.          Dorsalis pedis pulses are 2+ on the right side and 2+ on the left side.       Posterior tibial pulses are 2+ on the right side and 2+ on the left side.     Heart sounds: Normal heart sounds. No murmur heard.  No friction rub. No gallop.      Comments: Varicose veins present on bilateral lower extremities Pulmonary:     Effort: Pulmonary effort is normal. No respiratory distress.     Breath sounds: Normal breath sounds. No wheezing or rales.  Chest:     Chest wall: No tenderness.     Breasts:        Right: Normal. No swelling, bleeding, inverted nipple, mass, nipple discharge, skin change or tenderness.        Left: Normal. No swelling, bleeding, inverted nipple, mass, nipple discharge, skin change or tenderness.  Abdominal:     General: Bowel sounds are normal.     Palpations: Abdomen is soft.     Tenderness: There is no abdominal tenderness.     Hernia: There is  no hernia in the left inguinal area or right inguinal area.  Genitourinary:    Labia:        Right: No rash, tenderness or lesion.        Left: No rash, tenderness or lesion.      Vagina: Normal. No vaginal discharge, erythema, tenderness or bleeding.     Cervix: No cervical motion tenderness, discharge, friability, lesion or erythema.     Uterus: Normal.      Adnexa: Right adnexa normal.     Comments: No tenderness, masses, or organomeglay present during bimanual exam . Musculoskeletal:        General: Normal range of motion.     Cervical back: Normal range of motion and neck supple.  Right foot: Normal range of motion. No deformity or bunion.     Left foot: Normal range of motion. No deformity or bunion.  Feet:     Right foot:     Protective Sensation: 10 sites tested. 10 sites sensed.     Skin integrity: Skin integrity normal.     Toenail Condition: Right toenails are normal.     Left foot:     Protective Sensation: 10 sites tested. 10 sites sensed.     Skin integrity: Skin integrity normal.     Toenail Condition: Left toenails are normal.  Lymphadenopathy:     Cervical: No cervical adenopathy.     Upper Body:     Right upper body: No axillary adenopathy.     Left upper body: No axillary adenopathy.     Lower Body: No right inguinal adenopathy. No left inguinal adenopathy.  Skin:    General: Skin is warm and dry.     Capillary Refill: Capillary refill takes less than 2 seconds.  Neurological:     General: No focal deficit present.     Mental Status: She is alert and oriented to person, place, and time.     Cranial Nerves: No cranial nerve deficit.  Psychiatric:        Mood and Affect: Mood normal.        Behavior: Behavior normal.        Thought Content: Thought content normal.        Judgment: Judgment normal.      LABS: Recent Results (from the past 2160 hour(s))  POCT HgB A1C     Status: Abnormal   Collection Time: 09/28/20 10:03 AM  Result Value Ref Range    Hemoglobin A1C 7.3 (A) 4.0 - 5.6 %   HbA1c POC (<> result, manual entry)     HbA1c, POC (prediabetic range)     HbA1c, POC (controlled diabetic range)    UA/M w/rflx Culture, Routine     Status: Abnormal   Collection Time: 09/28/20 12:28 PM   Specimen: Urine   Urine  Result Value Ref Range   Specific Gravity, UA 1.026 1.005 - 1.030   pH, UA 5.0 5.0 - 7.5   Color, UA Yellow Yellow   Appearance Ur Turbid (A) Clear   Leukocytes,UA 1+ (A) Negative   Protein,UA Trace Negative/Trace   Glucose, UA 1+ (A) Negative   Ketones, UA Negative Negative   RBC, UA Negative Negative   Bilirubin, UA Negative Negative   Urobilinogen, Ur 0.2 0.2 - 1.0 mg/dL   Nitrite, UA Negative Negative   Microscopic Examination See below:     Comment: Microscopic was indicated and was performed.   Urinalysis Reflex Comment     Comment: This specimen has reflexed to a Urine Culture.  Microscopic Examination     Status: Abnormal   Collection Time: 09/28/20 12:28 PM   Urine  Result Value Ref Range   WBC, UA 6-10 (A) 0 - 5 /hpf   RBC None seen 0 - 2 /hpf   Epithelial Cells (non renal) >10 (A) 0 - 10 /hpf   Casts None seen None seen /lpf   Bacteria, UA Few None seen/Few  Urine Culture, Reflex     Status: None   Collection Time: 09/28/20 12:28 PM   Urine  Result Value Ref Range   Urine Culture, Routine Final report    Organism ID, Bacteria Lactobacillus species     Comment: Greater than 100,000 colony forming units per mL Susceptibility  not normally performed on this organism.   IGP, Aptima HPV     Status: None   Collection Time: 09/28/20 12:29 PM  Result Value Ref Range   Interpretation NILM     Comment: NEGATIVE FOR INTRAEPITHELIAL LESION OR MALIGNANCY.   Category NIL     Comment: Negative for Intraepithelial Lesion   Adequacy SECNI     Comment: Satisfactory for evaluation. No endocervical component is identified.   Clinician Provided ICD10 Comment     Comment: Z12.4   Performed by: Comment      Comment: Shanon Brow, Cytotechnologist (ASCP)   Note: Comment     Comment: The Pap smear is a screening test designed to aid in the detection of premalignant and malignant conditions of the uterine cervix.  It is not a diagnostic procedure and should not be used as the sole means of detecting cervical cancer.  Both false-positive and false-negative reports do occur.    Test Methodology Comment     Comment: This liquid based ThinPrep(R) pap test was screened with the use of an image guided system.    HPV Aptima Negative Negative    Comment: This nucleic acid amplification test detects fourteen high-risk HPV types (16,18,31,33,35,39,45,51,52,56,58,59,66,68) without differentiation.     Assessment/Plan: 1. Encounter for general adult medical examination with abnormal findings Annual health maintenance exam and pap smear today.   2. Type 2 diabetes mellitus with hyperglycemia, without long-term current use of insulin ([facility]CC) - POCT HgB A1C 7.3 today. Will increase dose glucovance to 2 tablets twice daily. Monitor blood sugars closely. Check HgbA1c at next visit.  - glyBURIDE-metformin (GLUCOVANCE) 5-500 MG tablet; Take 2 tablets po BID for DM2  Dispense: 360 tablet; Refill: 1  3. Essential hypertension Stable. Continue bp medication as prescribed.  - benazepril (LOTENSIN) 10 MG tablet; Take 1 tablet (10 mg total) by mouth daily.  Dispense: 90 tablet; Refill: 3  4. Varicose veins of both lower extremities with inflammation recommendded she wear compression socks at all times while awake. Will get venous ultrasound of bilateral lower extremities for further evaluation.  - VAS [place] LOWER EXTREMITY VENOUS REFLUX; Future  5. Routine cervical smear - IGP, Aptima HPV  6. Encounter for screening mammogram for malignant neoplasm of breast Screening mammogram ordered today.   7. Flu vaccine need Flu vaccine administered in the office today.  - Flu Vaccine MDCK QUAD PF  8. Dysuria -  UA/M w/rflx Culture, Routine  General Counseling: Glorya verbalizes understanding of the findings of todays visit and agrees with plan of treatment. I have discussed any further diagnostic evaluation that may be needed or ordered today. We also reviewed her medications today. she has been encouraged to call the office with any questions or concerns that should arise related to todays visit.    Counseling:  Diabetes Counseling:  1. Addition of ACE inh/ ARB'S for nephroprotection. Microalbumin is updated  2. Diabetic foot care, prevention of complications. Podiatry consult 3. Exercise and lose weight.  4. Diabetic eye examination, Diabetic eye exam is updated  5. Monitor blood sugar closlely. nutrition counseling.  6. Sign and symptoms of hypoglycemia including shaking sweating,confusion and headaches.  This patient was seen by Leretha Pol FNP Collaboration with Dr Lavera Guise as a part of collaborative care agreement  Orders Placed This Encounter  Procedures  . Microscopic Examination  . Urine Culture, Reflex  . Flu Vaccine MDCK QUAD PF  . UA/M w/rflx Culture, Routine  . POCT HgB A1C  . VAS  [place] LOWER EXTREMITY VENOUS REFLUX    Meds ordered this encounter  Medications  . benazepril (LOTENSIN) 10 MG tablet    Sig: Take 1 tablet (10 mg total) by mouth daily.    Dispense:  90 tablet    Refill:  3    Order Specific Question:   Supervising Provider    Answer:   Lavera Guise [[id number]]  . glyBURIDE-metformin (GLUCOVANCE) 5-500 MG tablet    Sig: Take 2 tablets po BID for DM2    Dispense:  360 tablet    Refill:  1    Please note increased dose    Order Specific Question:   Supervising Provider    Answer:   Lavera Guise [[id number]]    Total time spent: 48 Minutes  Time spent includes review of chart, medications, test results, and follow up plan with the patient.     Lavera Guise, MD  Internal Medicine

## 2020-10-03 NOTE — Progress Notes (Signed)
Contaminated specimen

## 2020-10-04 LAB — IGP, APTIMA HPV: HPV Aptima: NEGATIVE

## 2020-10-04 LAB — UA/M W/RFLX CULTURE, ROUTINE
Bilirubin, UA: NEGATIVE
Ketones, UA: NEGATIVE
Nitrite, UA: NEGATIVE
RBC, UA: NEGATIVE
Specific Gravity, UA: 1.026 (ref 1.005–1.030)
Urobilinogen, Ur: 0.2 mg/dL (ref 0.2–1.0)
pH, UA: 5 (ref 5.0–7.5)

## 2020-10-04 LAB — MICROSCOPIC EXAMINATION
Casts: NONE SEEN /lpf
Epithelial Cells (non renal): >10 /hpf — AB (ref 0–10)
RBC, Urine: NONE SEEN /hpf (ref 0–2)

## 2020-10-04 LAB — URINE CULTURE, REFLEX

## 2020-10-04 NOTE — Progress Notes (Signed)
Please let the patient know that her pap smear was normal. Thanks.

## 2020-10-12 ENCOUNTER — Other Ambulatory Visit: Payer: Self-pay | Admitting: Nurse Practitioner

## 2020-10-12 DIAGNOSIS — Z1231 Encounter for screening mammogram for malignant neoplasm of breast: Secondary | ICD-10-CM

## 2020-10-14 DIAGNOSIS — I8311 Varicose veins of right lower extremity with inflammation: Secondary | ICD-10-CM | POA: Insufficient documentation

## 2020-10-14 DIAGNOSIS — Z23 Encounter for immunization: Secondary | ICD-10-CM | POA: Insufficient documentation

## 2020-10-14 DIAGNOSIS — Z1231 Encounter for screening mammogram for malignant neoplasm of breast: Secondary | ICD-10-CM | POA: Insufficient documentation

## 2020-10-14 DIAGNOSIS — Z124 Encounter for screening for malignant neoplasm of cervix: Secondary | ICD-10-CM | POA: Insufficient documentation

## 2020-10-14 DIAGNOSIS — I8312 Varicose veins of left lower extremity with inflammation: Secondary | ICD-10-CM | POA: Insufficient documentation

## 2020-10-19 ENCOUNTER — Other Ambulatory Visit: Payer: Self-pay

## 2020-10-19 ENCOUNTER — Ambulatory Visit
Admission: RE | Admit: 2020-10-19 | Discharge: 2020-10-19 | Disposition: A | Discharge status: Home / Self Care | Payer: 59 | Source: Ambulatory Visit | Attending: Nurse Practitioner | Admitting: Nurse Practitioner

## 2020-10-19 DIAGNOSIS — Z1231 Encounter for screening mammogram for malignant neoplasm of breast: Secondary | ICD-10-CM | POA: Diagnosis not present

## 2020-10-25 ENCOUNTER — Telehealth: Payer: Self-pay

## 2020-10-25 NOTE — Telephone Encounter (Signed)
Confirmed and screened for 10-26-20 ov. Regina Short

## 2020-10-26 ENCOUNTER — Other Ambulatory Visit: Payer: Self-pay

## 2020-10-26 ENCOUNTER — Ambulatory Visit: Payer: 59

## 2020-10-26 DIAGNOSIS — I8312 Varicose veins of left lower extremity with inflammation: Secondary | ICD-10-CM

## 2020-10-26 DIAGNOSIS — I8311 Varicose veins of right lower extremity with inflammation: Secondary | ICD-10-CM

## 2020-11-01 NOTE — Progress Notes (Signed)
Normal ultrasound. Discuss at visit 11/12

## 2020-11-01 NOTE — Progress Notes (Signed)
Negative mammogram

## 2020-11-02 ENCOUNTER — Ambulatory Visit (INDEPENDENT_AMBULATORY_CARE_PROVIDER_SITE_OTHER): Payer: 59 | Admitting: Nurse Practitioner

## 2020-11-02 ENCOUNTER — Other Ambulatory Visit: Payer: Self-pay

## 2020-11-02 ENCOUNTER — Encounter: Payer: Self-pay | Admitting: Nurse Practitioner

## 2020-11-02 VITALS — BP 152/86 | HR 86 | Temp 98.0°F | Resp 16 | Ht 66.0 in | Wt 140.8 lb

## 2020-11-02 DIAGNOSIS — J069 Acute upper respiratory infection, unspecified: Secondary | ICD-10-CM | POA: Diagnosis not present

## 2020-11-02 DIAGNOSIS — I8312 Varicose veins of left lower extremity with inflammation: Secondary | ICD-10-CM

## 2020-11-02 DIAGNOSIS — I8311 Varicose veins of right lower extremity with inflammation: Secondary | ICD-10-CM

## 2020-11-02 DIAGNOSIS — M25532 Pain in left wrist: Secondary | ICD-10-CM

## 2020-11-02 DIAGNOSIS — E1165 Type 2 diabetes mellitus with hyperglycemia: Secondary | ICD-10-CM

## 2020-11-02 DIAGNOSIS — M25531 Pain in right wrist: Secondary | ICD-10-CM | POA: Diagnosis not present

## 2020-11-02 DIAGNOSIS — I10 Essential (primary) hypertension: Secondary | ICD-10-CM

## 2020-11-02 MED ORDER — AZITHROMYCIN 250 MG PO TABS: ORAL_TABLET | ORAL | 0 refills | Status: DC

## 2020-11-02 NOTE — Progress Notes (Signed)
[facility] [place], [place] [address]  Internal MEDICINE  Office Visit Note  Patient Name: Regina Short  [id number]  [id number]  Date of Service: 11/25/2020  Chief Complaint  Patient presents with  . Follow-up  . Diabetes  . Gastroesophageal Reflux  . Hypertension  . Quality Metric Gaps    eye,tetnaus  . controlled substance form    reviewed with PT    The patient is here for follow up visit. Today, she has nasal congestion, post nasal drip, and ear congestions. She is hoarse.  Has some bilateral wrist pain,associated with long hours at work.  Had venous ultrasound of bilateral lower legs which was normal.       Current Medication: Outpatient Encounter Medications as of 11/02/2020  Medication Sig  . benazepril (LOTENSIN) 10 MG tablet Take 1 tablet (10 mg total) by mouth daily.  . ergocalciferol (DRISDOL) 1.25 MG (50000 UT) capsule Take 1 capsule (50,000 Units total) by mouth once a week.  . fluticasone (FLONASE) 50 MCG/ACT nasal spray Place 2 sprays into both nostrils daily.  Marland Kitchen glucose blood (ACCU-CHEK GUIDE) test strip Check blood sugars BID and prn - E11.65  . glyBURIDE-metformin (GLUCOVANCE) 5-500 MG tablet Take 2 tablets po BID for DM2  . Lancets Misc. (ACCU-CHEK FASTCLIX LANCET) KIT Accu-Chek Fastclix [employer]  . ONGLYZA 5 MG TABS tablet Take 1 tablet (5 mg total) by mouth daily.  Marland Kitchen azithromycin (ZITHROMAX) 250 MG tablet z-pack - take as directed for 5 days   No facility-administered encounter medications on file as of 11/02/2020.    Surgical History: Past Surgical History:  Procedure Laterality Date  . ABDOMINAL HYSTERECTOMY    . ABDOMINAL SURGERY     5 tumors removed  . BREAST BIOPSY Right 02/02/15 core   focal fibroadenomatoid changes and sclerosis    Medical History: Past Medical History:  Diagnosis Date  . Chronic back pain   . Diabetes mellitus without complication ([place])   . GERD (gastroesophageal reflux disease)    . Hand tendonitis   . Hypertension     Family History: Family History  Problem Relation Age of Onset  . Congestive Heart Failure Other   . Breast cancer Maternal Aunt 60    Social History   Socioeconomic History  . Marital status: Single    Spouse name: Not on file  . Number of children: Not on file  . Years of education: Not on file  . Highest education level: Not on file  Occupational History  . Not on file  Tobacco Use  . Smoking status: Current Every Day Smoker    Packs/day: 0.50    Types: Cigarettes  . Smokeless tobacco: Never Used  . Tobacco comment: pack of cigarettes last two days  Substance and Sexual Activity  . Alcohol use: Yes    Alcohol/week: 0.0 standard drinks    Comment: holidays  . Drug use: No  . Sexual activity: Yes    Birth control/protection: None  Other Topics Concern  . Not on file  Social History Narrative  . Not on file   Social Determinants of Health   Financial Resource Strain:   . Difficulty of Paying Living Expenses: Not on file  Food Insecurity:   . Worried About Charity fundraiser in the Last Year: Not on file  . Ran Out of Food in the Last Year: Not on file  Transportation Needs:   . Lack of Transportation (Medical): Not on file  . Lack  of Transportation (Non-Medical): Not on file  Physical Activity:   . Days of Exercise per Week: Not on file  . Minutes of Exercise per Session: Not on file  Stress:   . Feeling of Stress : Not on file  Social Connections:   . Frequency of Communication with Friends and Family: Not on file  . Frequency of Social Gatherings with Friends and Family: Not on file  . Attends Religious Services: Not on file  . Active Member of Clubs or Organizations: Not on file  . Attends Archivist Meetings: Not on file  . Marital Status: Not on file  Intimate Partner Violence:   . Fear of Current or Ex-Partner: Not on file  . Emotionally Abused: Not on file  . Physically Abused: Not on file  .  Sexually Abused: Not on file      Review of Systems  Constitutional: Negative for activity change, chills, fatigue and unexpected weight change.  HENT: Positive for congestion, sinus pressure, sinus pain and sore throat. Negative for postnasal drip, rhinorrhea and sneezing.   Respiratory: Negative for cough, chest tightness, shortness of breath and wheezing.   Cardiovascular: Positive for leg swelling. Negative for chest pain and palpitations.       Blood pressure mildly elevated today.  Gastrointestinal: Negative for abdominal pain, constipation, diarrhea, nausea and vomiting.  Endocrine: Negative for cold intolerance, heat intolerance, polydipsia and polyuria.       The blood sugars have been higher over the past few months.   Musculoskeletal: Positive for arthralgias and myalgias. Negative for back pain, joint swelling and neck pain.       Some cramping and pain in both lower extremities, mostly at night and mostly after long shifts on her feet. Has arthritic issues in the joints of her fingers of both hands. Reporting bilateral wrist and hand pain.   Skin: Negative for rash.  Allergic/Immunologic: Negative for environmental allergies.  Neurological: Negative for dizziness, tremors, numbness and headaches.  Hematological: Negative for adenopathy. Does not bruise/bleed easily.  Psychiatric/Behavioral: Negative for behavioral problems (Depression), sleep disturbance and suicidal ideas. The patient is not nervous/anxious.     Today's Vitals   11/02/20 0942  BP: (!) 152/86  Pulse: 86  Resp: 16  Temp: 98 F (36.7 C)  SpO2: 95%  Weight: 140 lb 12.8 oz (63.9 kg)  Height: 5' 6" (1.676 m)   Body mass index is 22.73 kg/m.  Physical Exam Vitals and nursing note reviewed.  Constitutional:      General: She is not in acute distress.    Appearance: Normal appearance. She is well-developed. She is not diaphoretic.  HENT:     Head: Normocephalic and atraumatic.     Right Ear: Tympanic  membrane is erythematous and bulging.     Left Ear: Tympanic membrane is erythematous and bulging.     Nose: Congestion present.     Right Sinus: Maxillary sinus tenderness and frontal sinus tenderness present.     Left Sinus: Maxillary sinus tenderness and frontal sinus tenderness present.     Mouth/Throat:     Pharynx: Posterior oropharyngeal erythema present. No oropharyngeal exudate.  Eyes:     Pupils: Pupils are equal, round, and reactive to light.  Neck:     Thyroid: No thyromegaly.     Vascular: No JVD.     Trachea: No tracheal deviation.  Cardiovascular:     Rate and Rhythm: Normal rate and regular rhythm.     Heart sounds: Normal heart  sounds. No murmur heard.  No friction rub. No gallop.   Pulmonary:     Effort: Pulmonary effort is normal. No respiratory distress.     Breath sounds: Normal breath sounds. No wheezing or rales.  Chest:     Chest wall: No tenderness.  Abdominal:     Palpations: Abdomen is soft.  Musculoskeletal:        General: Normal range of motion.     Cervical back: Normal range of motion and neck supple.     Comments: Bilateral wrist tenderness. No swelling or bony deformities noted at this time.   Lymphadenopathy:     Cervical: No cervical adenopathy.  Skin:    General: Skin is warm and dry.  Neurological:     General: No focal deficit present.     Mental Status: She is alert and oriented to person, place, and time.     Cranial Nerves: No cranial nerve deficit.  Psychiatric:        Mood and Affect: Mood normal.        Behavior: Behavior normal.        Thought Content: Thought content normal.        Judgment: Judgment normal.    Assessment/Plan: 1. Varicose veins of both lower extremities with inflammation Reviewed venous ultrasound of both lower legs with the patient which was normal. Advised her to wear compression socks at all ties while awake. Will monitor closely.   2. Acute upper respiratory infection Start z-pack. Take as directed  for 5 days. Use OTC medication as needed and as indicated to relieve acute symptoms.  - azithromycin (ZITHROMAX) 250 MG tablet; z-pack - take as directed for 5 days  Dispense: 6 tablet; Refill: 0  3. Pain in both wrists Encourage her to rest, ice, and bandage the wrists when possible. May need referral to orthopedics if pain persists.   4. Essential hypertension Generally stable. Continue bp medication as prescribed   5. Type 2 diabetes mellitus with hyperglycemia, without long-term current use of insulin ([place]) Continue DM medication as prescribed   General Counseling: Kanesha verbalizes understanding of the findings of todays visit and agrees with plan of treatment. I have discussed any further diagnostic evaluation that may be needed or ordered today. We also reviewed her medications today. she has been encouraged to call the office with any questions or concerns that should arise related to todays visit.   This patient was seen by [place] with Dr Lavera Guise as a part of collaborative care agreement  Meds ordered this encounter  Medications  . azithromycin (ZITHROMAX) 250 MG tablet    Sig: z-pack - take as directed for 5 days    Dispense:  6 tablet    Refill:  0    Order Specific Question:   Supervising Provider    Answer:   Lavera Guise [[id number]]    Total time spent: 30 Minutes   Time spent includes review of chart, medications, test results, and follow up plan with the patient.      Dr Lavera Guise Internal medicine

## 2020-11-07 IMAGING — MG DIGITAL SCREENING BILAT W/ TOMO W/ CAD
8 series · 8 of 24 positions shown · non-contrast
Comparison: Previous exam(s).

CLINICAL DATA: Screening.

EXAM:
DIGITAL SCREENING BILATERAL MAMMOGRAM WITH TOMO AND CAD

[R CC synth-2D]
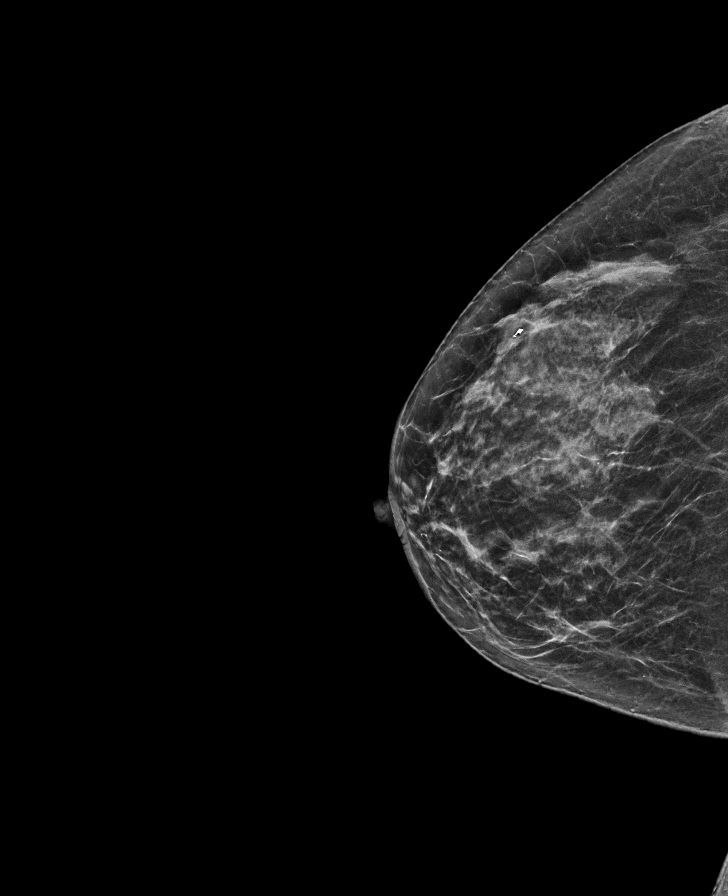

[L CC synth-2D]
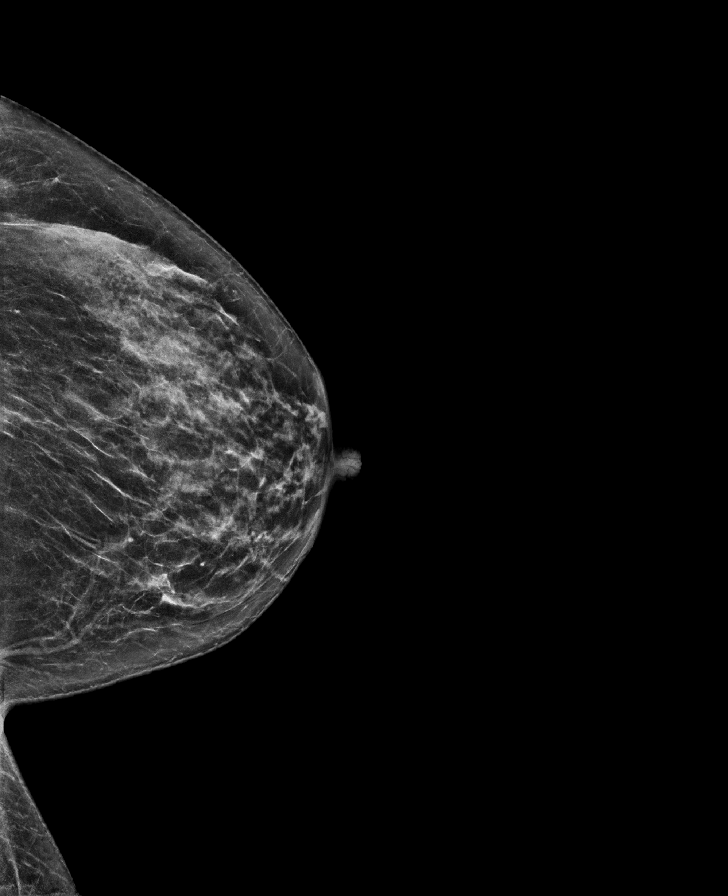

[R MLO synth-2D]
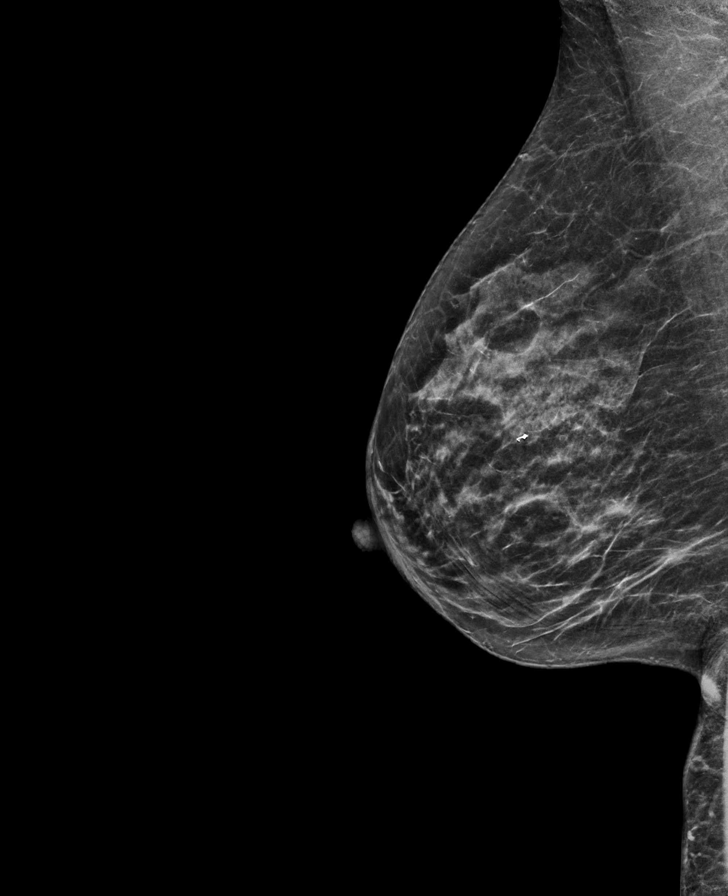

[L MLO synth-2D]
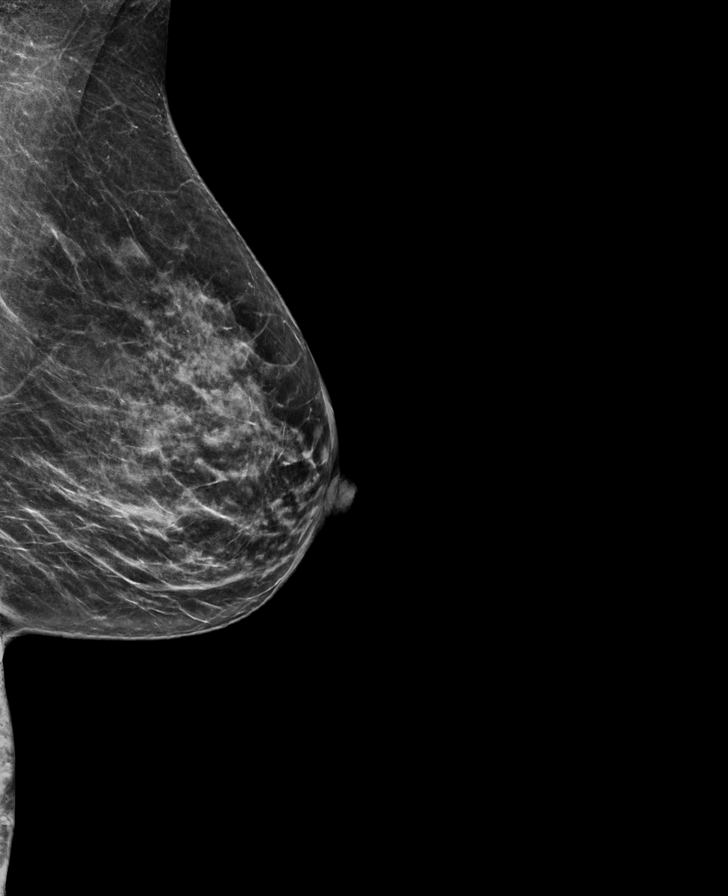

[R MLO tomo · tomo slice 29/58.0]
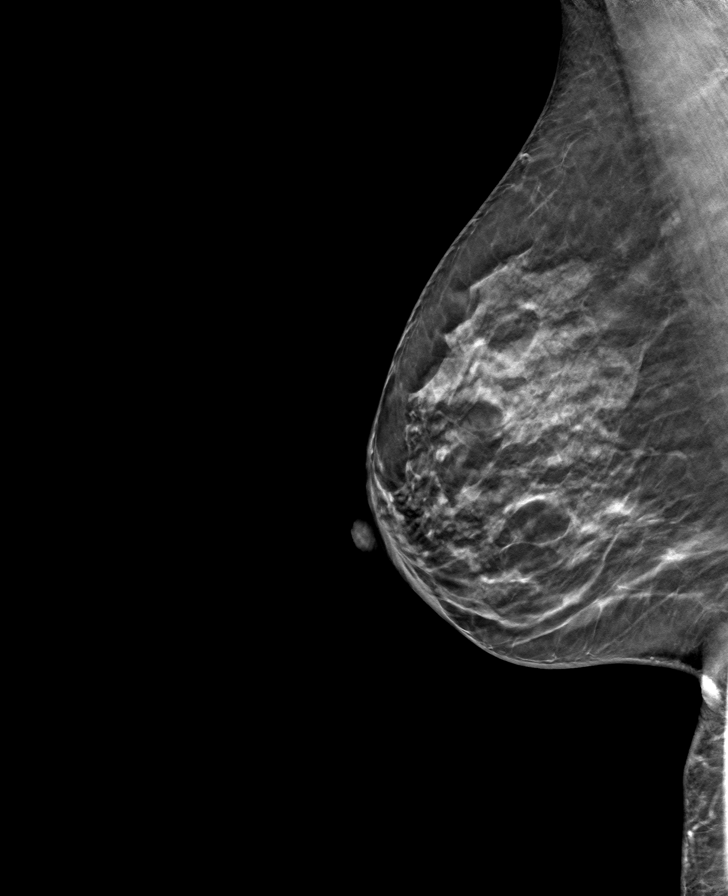

[R CC tomo · tomo slice 29/57.0]
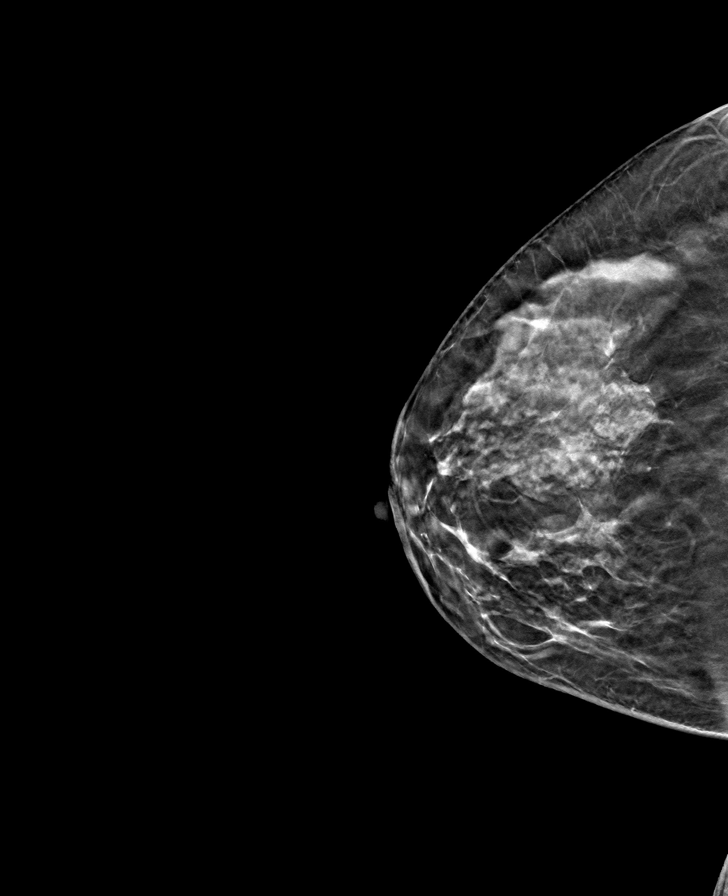

[L CC tomo · tomo slice 31/60.0]
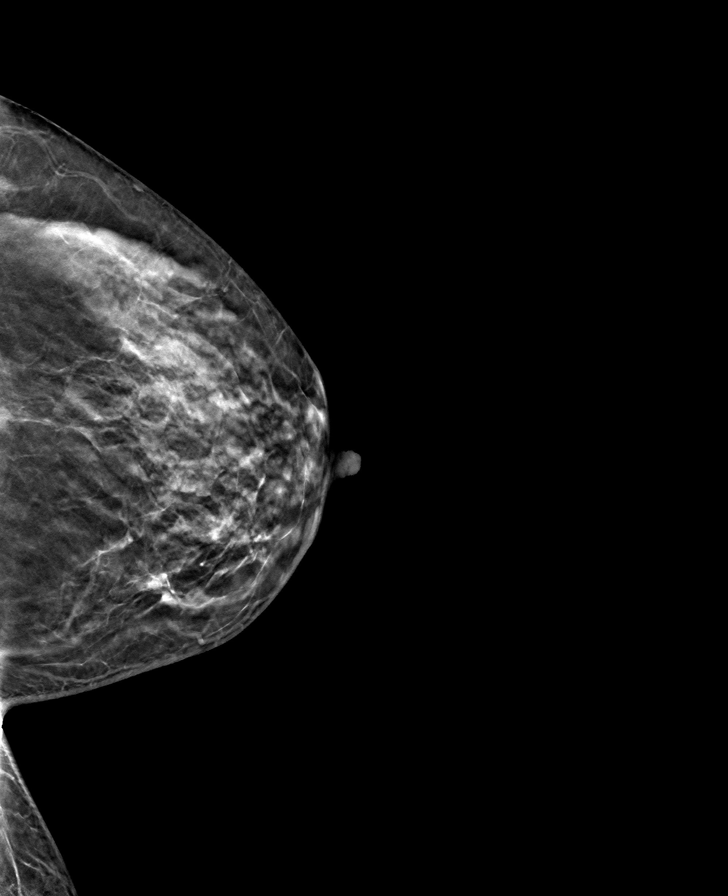

[L MLO tomo · tomo slice 30/59.0]
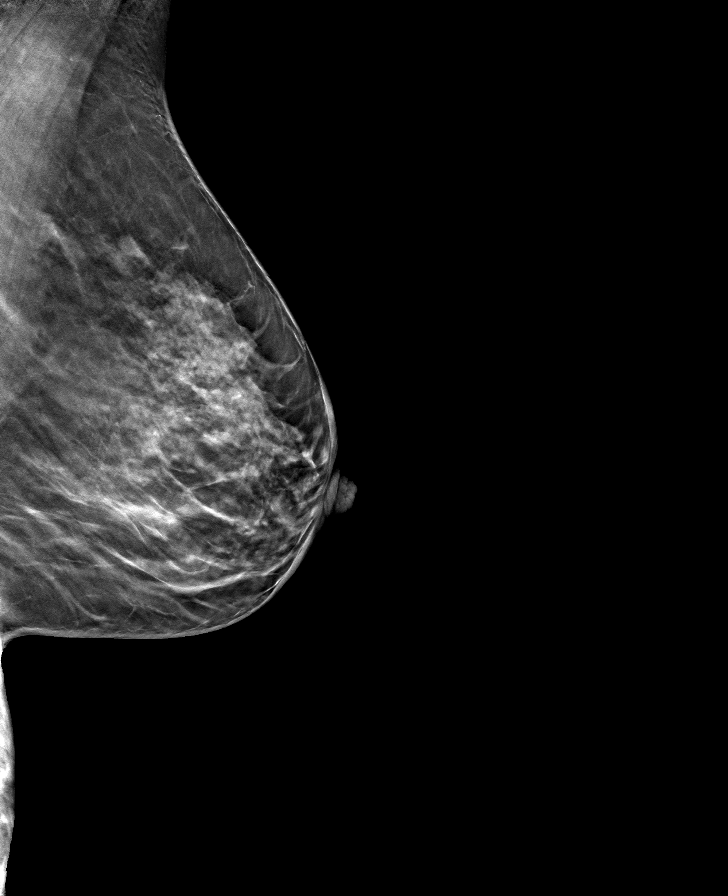

[8 of 24 positions shown; findings below may reference images not displayed]

ACR Breast Density Category c: The breast tissue is heterogeneously
dense, which may obscure small masses.
FINDINGS: There are no findings suspicious for malignancy. Images were
processed with CAD.
IMPRESSION: No mammographic evidence of malignancy. A result letter of this
screening mammogram will be mailed directly to the patient.

RECOMMENDATION:
Screening mammogram in one year. (Code:[id number])

BI-RADS CATEGORY  1: Negative.

## 2020-11-09 ENCOUNTER — Telehealth: Payer: Self-pay

## 2020-11-09 ENCOUNTER — Other Ambulatory Visit: Payer: Self-pay | Admitting: Nurse Practitioner

## 2020-11-09 DIAGNOSIS — J069 Acute upper respiratory infection, unspecified: Secondary | ICD-10-CM

## 2020-11-09 MED ORDER — SULFAMETHOXAZOLE-TRIMETHOPRIM 800-160 MG PO TABS: 1.0000 | ORAL_TABLET | Freq: Two times a day (BID) | ORAL | 0 refills | Status: DC

## 2020-11-09 NOTE — Telephone Encounter (Signed)
Did she take the z-pack I sent? I did send it on 11/12.

## 2020-11-09 NOTE — Telephone Encounter (Signed)
Lmom that we send med to phar  ?

## 2020-11-09 NOTE — Telephone Encounter (Signed)
I changed antibiotics to bactrim DS. This is twice daily for next 10 days. Sent to her pharmacy .

## 2020-11-09 NOTE — Telephone Encounter (Signed)
Yes she said its not helping

## 2020-11-25 DIAGNOSIS — M25532 Pain in left wrist: Secondary | ICD-10-CM | POA: Insufficient documentation

## 2020-11-25 DIAGNOSIS — M25531 Pain in right wrist: Secondary | ICD-10-CM | POA: Insufficient documentation

## 2020-11-25 DIAGNOSIS — J069 Acute upper respiratory infection, unspecified: Secondary | ICD-10-CM | POA: Insufficient documentation

## 2020-12-19 ENCOUNTER — Other Ambulatory Visit: Payer: Self-pay

## 2020-12-19 DIAGNOSIS — E119 Type 2 diabetes mellitus without complications: Secondary | ICD-10-CM

## 2020-12-19 MED ORDER — ONGLYZA 5 MG PO TABS: 5.0000 mg | ORAL_TABLET | Freq: Every day | ORAL | 3 refills | Status: DC

## 2021-01-04 ENCOUNTER — Other Ambulatory Visit: Payer: Self-pay

## 2021-01-04 ENCOUNTER — Ambulatory Visit (INDEPENDENT_AMBULATORY_CARE_PROVIDER_SITE_OTHER): Payer: BLUE CROSS/BLUE SHIELD | Admitting: Internal Medicine

## 2021-01-04 ENCOUNTER — Encounter: Payer: Self-pay | Admitting: Internal Medicine

## 2021-01-04 VITALS — BP 158/92 | HR 67 | Temp 97.3°F | Resp 16 | Ht 66.0 in | Wt 137.4 lb

## 2021-01-04 DIAGNOSIS — E119 Type 2 diabetes mellitus without complications: Secondary | ICD-10-CM

## 2021-01-04 DIAGNOSIS — F17200 Nicotine dependence, unspecified, uncomplicated: Secondary | ICD-10-CM

## 2021-01-04 DIAGNOSIS — I10 Essential (primary) hypertension: Secondary | ICD-10-CM | POA: Diagnosis not present

## 2021-01-04 DIAGNOSIS — E1165 Type 2 diabetes mellitus with hyperglycemia: Secondary | ICD-10-CM

## 2021-01-04 MED ORDER — BENAZEPRIL HCL 10 MG PO TABS: 10.0000 mg | ORAL_TABLET | Freq: Every day | ORAL | 3 refills | Status: DC

## 2021-01-04 MED ORDER — ONGLYZA 5 MG PO TABS: 5.0000 mg | ORAL_TABLET | Freq: Every day | ORAL | 1 refills | Status: DC

## 2021-01-04 NOTE — Progress Notes (Signed)
[facility] [place], [place] [address]  Internal MEDICINE  Office Visit Note  Patient Name: Regina Short  [id number]  [id number]  Date of Service: 01/08/2021  Chief Complaint  Patient presents with  . Follow-up  . Diabetes  . Gastroesophageal Reflux  . Hypertension  . Quality Metric Gaps    Eye exam    HPI  Patient is here for 2 month follow up. She is doing well.  Blood sugar log 130-180 over past 2 months. This AM was 125. Thinks she is doing well. Just refilled her medications.  A1c last visit was 7.3 new POCT hgB A1c today was 8.3. Discussed need for medication changes to better control her diabetes. No adverse reactions to medications, denies getting dizzy or lightheaded.  BP high today, but a little startled before it was done today. BP monitored at home. Recheck 142/84. Still smoking, but less. 1/3 ppd. Discussed benefits of cessation.     Current Medication: Outpatient Encounter Medications as of 01/04/2021  Medication Sig  . azithromycin (ZITHROMAX) 250 MG tablet z-pack - take as directed for 5 days  . benazepril (LOTENSIN) 10 MG tablet Take 1 tablet (10 mg total) by mouth daily.  . ergocalciferol (DRISDOL) 1.25 MG (50000 UT) capsule Take 1 capsule (50,000 Units total) by mouth once a week.  . fluticasone (FLONASE) 50 MCG/ACT nasal spray Place 2 sprays into both nostrils daily.  Marland Kitchen glucose blood (ACCU-CHEK GUIDE) test strip Check blood sugars BID and prn - E11.65  . glyBURIDE-metformin (GLUCOVANCE) 5-500 MG tablet Take 2 tablets po BID for DM2  . Lancets Misc. (ACCU-CHEK FASTCLIX LANCET) KIT Accu-Chek Fastclix [employer]  . ONGLYZA 5 MG TABS tablet Take 1 tablet (5 mg total) by mouth daily.  Marland Kitchen sulfamethoxazole-trimethoprim (BACTRIM DS) 800-160 MG tablet Take 1 tablet by mouth 2 (two) times daily.  . [DISCONTINUED] benazepril (LOTENSIN) 10 MG tablet Take 1 tablet (10 mg total) by mouth daily.  . [DISCONTINUED] ONGLYZA 5 MG TABS tablet Take  1 tablet (5 mg total) by mouth daily.   No facility-administered encounter medications on file as of 01/04/2021.    Surgical History: Past Surgical History:  Procedure Laterality Date  . ABDOMINAL HYSTERECTOMY    . ABDOMINAL SURGERY     5 tumors removed  . BREAST BIOPSY Right 02/02/15 core   focal fibroadenomatoid changes and sclerosis    Medical History: Past Medical History:  Diagnosis Date  . Chronic back pain   . Diabetes mellitus without complication ([place])   . GERD (gastroesophageal reflux disease)   . Hand tendonitis   . Hypertension     Family History: Family History  Problem Relation Age of Onset  . Congestive Heart Failure Other   . Breast cancer Maternal Aunt 60    Social History   Socioeconomic History  . Marital status: Single    Spouse name: Not on file  . Number of children: Not on file  . Years of education: Not on file  . Highest education level: Not on file  Occupational History  . Not on file  Tobacco Use  . Smoking status: Current Every Day Smoker    Packs/day: 0.50    Types: Cigarettes  . Smokeless tobacco: Never Used  . Tobacco comment: pack of cigarettes last two days  Substance and Sexual Activity  . Alcohol use: Yes    Alcohol/week: 0.0 standard drinks    Comment: holidays  . Drug use: No  . Sexual activity: Yes  Birth control/protection: None  Other Topics Concern  . Not on file  Social History Narrative  . Not on file   Social Determinants of Health   Financial Resource Strain: Not on file  Food Insecurity: Not on file  Transportation Needs: Not on file  Physical Activity: Not on file  Stress: Not on file  Social Connections: Not on file  Intimate Partner Violence: Not on file      Review of Systems  Constitutional: Negative for chills, fatigue and unexpected weight change.  HENT: Positive for postnasal drip. Negative for congestion, rhinorrhea, sneezing and sore throat.   Eyes: Negative for redness.  Respiratory:  Negative for cough, chest tightness and shortness of breath.   Cardiovascular: Negative for chest pain and palpitations.  Gastrointestinal: Negative for abdominal pain, constipation, diarrhea, nausea and vomiting.  Genitourinary: Negative for dysuria and frequency.  Musculoskeletal: Negative for arthralgias, back pain, joint swelling and neck pain.  Skin: Negative for rash.  Neurological: Positive for numbness. Negative for dizziness, tremors, light-headedness and headaches.       Some decreased sensation from DM  Hematological: Negative for adenopathy. Does not bruise/bleed easily.  Psychiatric/Behavioral: Negative for behavioral problems (Depression), sleep disturbance and suicidal ideas. The patient is not nervous/anxious.     Vital Signs: BP (!) 158/92   Pulse 67   Temp (!) 97.3 F (36.3 C)   Resp 16   Ht _0  (1.676 m)   Wt 137 lb 6.4 oz (62.3 kg)   SpO2 95%   BMI 22.18 kg/m    Physical Exam Constitutional:      General: She is not in acute distress.    Appearance: She is well-developed and well-nourished. She is not diaphoretic.  HENT:     Head: Normocephalic and atraumatic.     Mouth/Throat:     Mouth: Oropharynx is clear and moist.     Pharynx: No oropharyngeal exudate.  Eyes:     Extraocular Movements: EOM normal.     Pupils: Pupils are equal, round, and reactive to light.  Neck:     Thyroid: No thyromegaly.     Vascular: No JVD.     Trachea: No tracheal deviation.  Cardiovascular:     Rate and Rhythm: Normal rate and regular rhythm.     Pulses: Normal pulses.     Heart sounds: Normal heart sounds. No murmur heard. No friction rub. No gallop.   Pulmonary:     Effort: Pulmonary effort is normal. No respiratory distress.     Breath sounds: No wheezing or rales.  Chest:     Chest wall: No tenderness.  Abdominal:     General: Bowel sounds are normal.     Palpations: Abdomen is soft.  Musculoskeletal:        General: Normal range of motion.     Cervical  back: Normal range of motion and neck supple.  Lymphadenopathy:     Cervical: No cervical adenopathy.  Skin:    General: Skin is warm and dry.  Neurological:     Mental Status: She is alert and oriented to person, place, and time.     Cranial Nerves: No cranial nerve deficit.  Psychiatric:        Mood and Affect: Mood and affect normal.        Behavior: Behavior normal.        Thought Content: Thought content normal.        Judgment: Judgment normal.        Assessment/Plan:  1. Type 2 diabetes mellitus with hyperglycemia, without long-term current use of insulin (HCC) Alc increased to 8.3 at today's visit. Increased Onglyza to every day rather than as needed. She should continue her glyburide-metformin. - POCT HgB A1C  2. Essential hypertension BP elevated on arrival but improved on retake in office to 142/84.will increase to 20 mg if continues to be elevated on future visits  Continue current medications. - benazepril (LOTENSIN) 10 MG tablet; Take 1 tablet (10 mg total) by mouth daily.  Dispense: 90 tablet; Refill: 3  Hypertension Counseling:   The following hypertensive lifestyle modification were recommended and discussed:  1. Limiting alcohol intake to less than 1 oz/day of ethanol:(24 oz of beer or 8 oz of wine or 2 oz of 100-proof whiskey). 2. Take baby ASA 81 mg daily. 3. Importance of regular aerobic exercise and losing weight. 4. Reduce dietary saturated fat and cholesterol intake for overall cardiovascular health. 5. Maintaining adequate dietary potassium, calcium, and magnesium intake. 6. Regular monitoring of the blood pressure. 7. Reduce sodium intake to less than 100 mmol/day (less than 2.3 gm of sodium or less than 6 gm of sodium choride)   3. Nicotine dependence with current use Currently at 1/3 ppd, discussed importance of cessation. Smoking cessation counseling: 1. Pt acknowledges the risks of long term smoking, she will try to quite smoking. 2. Options for  different medications including nicotine products, chewing gum, patch etc, Wellbutrin and Chantix is discussed 3. Goal and date of compete cessation is discussed 4. Total time spent in smoking cessation is 10 min.  4. Uncontrolled DM with hyperglycemia  Take Onglyza daily and continue other current medications. Follow up in 2 months. - ONGLYZA 5 MG TABS tablet; Take 1 tablet (5 mg total) by mouth daily.  Dispense: 90 tablet; Refill: 1  General Counseling: Jerelene verbalizes understanding of the findings of todays visit and agrees with plan of treatment. I have discussed any further diagnostic evaluation that may be needed or ordered today. We also reviewed her medications today. she has been encouraged to call the office with any questions or concerns that should arise related to todays visit.    Orders Placed This Encounter  Procedures  . POCT HgB A1C    Meds ordered this encounter  Medications  . benazepril (LOTENSIN) 10 MG tablet    Sig: Take 1 tablet (10 mg total) by mouth daily.    Dispense:  90 tablet    Refill:  3  . ONGLYZA 5 MG TABS tablet    Sig: Take 1 tablet (5 mg total) by mouth daily.    Dispense:  90 tablet    Refill:  1    Total time spent: 25 Minutes Time spent includes review of chart, medications, test results, and follow up plan with the patient.    Dr Lavera Guise Internal medicine

## 2021-01-08 LAB — POCT GLYCOSYLATED HEMOGLOBIN (HGB A1C): Hemoglobin A1C: 8.3 % — AB (ref 4.0–5.6)

## 2021-01-25 ENCOUNTER — Other Ambulatory Visit: Payer: Self-pay

## 2021-01-25 DIAGNOSIS — E1165 Type 2 diabetes mellitus with hyperglycemia: Secondary | ICD-10-CM

## 2021-01-25 MED ORDER — DAPAGLIFLOZIN PROPANEDIOL 10 MG PO TABS: 10.0000 mg | ORAL_TABLET | Freq: Every day | ORAL | 1 refills | Status: DC

## 2021-01-25 MED ORDER — GLYBURIDE-METFORMIN 5-500 MG PO TABS: ORAL_TABLET | ORAL | 1 refills | Status: DC

## 2021-03-08 ENCOUNTER — Encounter: Payer: Self-pay | Admitting: Physician Assistant

## 2021-03-08 ENCOUNTER — Ambulatory Visit (INDEPENDENT_AMBULATORY_CARE_PROVIDER_SITE_OTHER): Payer: BLUE CROSS/BLUE SHIELD | Admitting: Physician Assistant

## 2021-03-08 ENCOUNTER — Other Ambulatory Visit: Payer: Self-pay

## 2021-03-08 DIAGNOSIS — I83813 Varicose veins of bilateral lower extremities with pain: Secondary | ICD-10-CM

## 2021-03-08 DIAGNOSIS — I10 Essential (primary) hypertension: Secondary | ICD-10-CM

## 2021-03-08 DIAGNOSIS — F17200 Nicotine dependence, unspecified, uncomplicated: Secondary | ICD-10-CM | POA: Diagnosis not present

## 2021-03-08 DIAGNOSIS — E1165 Type 2 diabetes mellitus with hyperglycemia: Secondary | ICD-10-CM

## 2021-03-08 MED ORDER — ACCU-CHEK GUIDE VI STRP: ORAL_STRIP | 3 refills | Status: DC

## 2021-03-08 MED ORDER — DAPAGLIFLOZIN PROPANEDIOL 5 MG PO TABS: 5.0000 mg | ORAL_TABLET | Freq: Every day | ORAL | 2 refills | Status: DC

## 2021-03-08 NOTE — Progress Notes (Signed)
[facility] [address], [place] [address]  Internal MEDICINE  Office Visit Note  Patient Name: Regina Short  [id number]  [id number]  Date of Service: 03/08/2021  Chief Complaint  Patient presents with  . Diabetes  . Hypertension    HPI Pt is here for f/u of Dm and HTN. -She is doing 1.5 tabs BID of glyburide-metformin and she stopped the farxiga because she thought it was too sttrong. Made her tired and decreased her appetite. She wants to know if their is a lower dose to try. BG fasting numbers range 90-150; avg 130s. Per insurance they will cover farxiga or jardiance--denied onglyza which she previously took. -She is taking OTC vitamin D supplement. -Does not check BP at home bc home cuff broke, her daughter is going to buy her a replacement so she can log it. -She stopped smoking yesterday.  -She does state she has some varicose veins that hurt after she stands for 12 hour shifts. She states she is going to start wearing compression stockings.  Current Medication: Outpatient Encounter Medications as of 03/08/2021  Medication Sig  . benazepril (LOTENSIN) 10 MG tablet Take 1 tablet (10 mg total) by mouth daily.  . ergocalciferol (DRISDOL) 1.25 MG (50000 UT) capsule Take 1 capsule (50,000 Units total) by mouth once a week.  . fluticasone (FLONASE) 50 MCG/ACT nasal spray Place 2 sprays into both nostrils daily.  Marland Kitchen glyBURIDE-metformin (GLUCOVANCE) 5-500 MG tablet Take 1.5 tablets po BID or if blood sugar drops take 1 tablet po BID  . Lancets Misc. (ACCU-CHEK FASTCLIX LANCET) KIT Accu-Chek Fastclix [employer]  . [DISCONTINUED] azithromycin (ZITHROMAX) 250 MG tablet z-pack - take as directed for 5 days  . [DISCONTINUED] dapagliflozin propanediol (FARXIGA) 5 MG TABS tablet Take by mouth daily.  . [DISCONTINUED] glucose blood (ACCU-CHEK GUIDE) test strip Check blood sugars BID and prn - E11.65  . [DISCONTINUED] sulfamethoxazole-trimethoprim (BACTRIM DS) 800-160 MG  tablet Take 1 tablet by mouth 2 (two) times daily.  . dapagliflozin propanediol (FARXIGA) 5 MG TABS tablet Take 1 tablet (5 mg total) by mouth daily.  Marland Kitchen glucose blood (ACCU-CHEK GUIDE) test strip Check blood sugars BID and prn - E11.65  . [DISCONTINUED] dapagliflozin propanediol (FARXIGA) 10 MG TABS tablet Take 1 tablet (10 mg total) by mouth daily. (Patient not taking: Reported on 03/08/2021)   No facility-administered encounter medications on file as of 03/08/2021.    Surgical History: Past Surgical History:  Procedure Laterality Date  . ABDOMINAL HYSTERECTOMY    . ABDOMINAL SURGERY     5 tumors removed  . BREAST BIOPSY Right 02/02/15 core   focal fibroadenomatoid changes and sclerosis    Medical History: Past Medical History:  Diagnosis Date  . Chronic back pain   . Diabetes mellitus without complication ([place])   . GERD (gastroesophageal reflux disease)   . Hand tendonitis   . Hypertension     Family History: Family History  Problem Relation Age of Onset  . Congestive Heart Failure Other   . Breast cancer Maternal Aunt 60    Social History   Socioeconomic History  . Marital status: Single    Spouse name: Not on file  . Number of children: Not on file  . Years of education: Not on file  . Highest education level: Not on file  Occupational History  . Not on file  Tobacco Use  . Smoking status: Current Every Day Smoker    Packs/day: 0.50    Types: Cigarettes  .  Smokeless tobacco: Never Used  . Tobacco comment: pack of cigarettes last two days  Substance and Sexual Activity  . Alcohol use: Yes    Alcohol/week: 0.0 standard drinks    Comment: holidays  . Drug use: No  . Sexual activity: Yes    Birth control/protection: None  Other Topics Concern  . Not on file  Social History Narrative  . Not on file   Social Determinants of Health   Financial Resource Strain: Not on file  Food Insecurity: Not on file  Transportation Needs: Not on file  Physical  Activity: Not on file  Stress: Not on file  Social Connections: Not on file  Intimate Partner Violence: Not on file      Review of Systems  Constitutional: Negative for chills, fatigue and unexpected weight change.  HENT: Negative for congestion, postnasal drip, rhinorrhea, sneezing and sore throat.   Eyes: Negative for redness.  Respiratory: Negative for cough, chest tightness and shortness of breath.   Cardiovascular: Negative for chest pain and palpitations.       Varicose veins painful after standing all day  Gastrointestinal: Negative for abdominal pain, constipation, diarrhea, nausea and vomiting.  Genitourinary: Negative for dysuria and frequency.  Musculoskeletal: Negative for arthralgias, back pain, joint swelling and neck pain.  Skin: Negative for rash.  Neurological: Negative.  Negative for tremors and numbness.  Hematological: Negative for adenopathy. Does not bruise/bleed easily.  Psychiatric/Behavioral: Negative for behavioral problems (Depression), sleep disturbance and suicidal ideas. The patient is not nervous/anxious.     Vital Signs: BP 129/79   Pulse 70   Temp 97.8 F (36.6 C)   Resp 16   Ht 5' 6" (1.676 m)   Wt 136 lb (61.7 kg)   SpO2 99%   BMI 21.95 kg/m    Physical Exam Vitals and nursing note reviewed.  Constitutional:      General: She is not in acute distress.    Appearance: She is well-developed and normal weight. She is not diaphoretic.  HENT:     Head: Normocephalic and atraumatic.     Mouth/Throat:     Pharynx: No oropharyngeal exudate.  Eyes:     Pupils: Pupils are equal, round, and reactive to light.  Neck:     Thyroid: No thyromegaly.     Vascular: No JVD.     Trachea: No tracheal deviation.  Cardiovascular:     Rate and Rhythm: Normal rate and regular rhythm.     Pulses: Normal pulses.     Heart sounds: Normal heart sounds. No murmur heard. No friction rub. No gallop.      Comments: Varicose veins present, no color  changes Pulmonary:     Effort: Pulmonary effort is normal. No respiratory distress.     Breath sounds: No wheezing or rales.  Chest:     Chest wall: No tenderness.  Abdominal:     General: Bowel sounds are normal.     Palpations: Abdomen is soft.  Musculoskeletal:        General: Normal range of motion.     Cervical back: Normal range of motion and neck supple.  Lymphadenopathy:     Cervical: No cervical adenopathy.  Skin:    General: Skin is warm and dry.  Neurological:     Mental Status: She is alert and oriented to person, place, and time.     Cranial Nerves: No cranial nerve deficit.  Psychiatric:        Behavior: Behavior normal.  Thought Content: Thought content normal.        Judgment: Judgment normal.        Assessment/Plan: 1. Type 2 diabetes mellitus with hyperglycemia, without long-term current use of insulin ([place]) Will try decreasing dose of Farxiga to 82m daily and continue 1.5 tabs BID of glyburide-metformin. She will continue to log BG at home and continue to work on diet/exercise. - glucose blood (ACCU-CHEK GUIDE) test strip; Check blood sugars BID and prn - E11.65  Dispense: 100 each; Refill: 3 - dapagliflozin propanediol (FARXIGA) 5 MG TABS tablet; Take 1 tablet (5 mg total) by mouth daily.  Dispense: 30 tablet; Refill: 2  2. Essential hypertension BP well controlled. Continue 156mLotensin. She will obtain a new cuff and monitor at home.  3. Nicotine dependence with current use Per pt she stopped smoking yesterday. She hopes to be able to stay away from it now.  4. Varicose veins of both lower extremities with pain Pt will try wearing compression stockings especially when she works 12 hour shifts and is on her feet all day.   General Counseling: [place] rillingnderstanding of the findings of todays visit and agrees with plan of treatment. I have discussed any further diagnostic evaluation that may be needed or ordered today. We also reviewed  her medications today. she has been encouraged to call the office with any questions or concerns that should arise related to todays visit.    No orders of the defined types were placed in this encounter.   Meds ordered this encounter  Medications  . glucose blood (ACCU-CHEK GUIDE) test strip    Sig: Check blood sugars BID and prn - E11.65    Dispense:  100 each    Refill:  3  . dapagliflozin propanediol (FARXIGA) 5 MG TABS tablet    Sig: Take 1 tablet (5 mg total) by mouth daily.    Dispense:  30 tablet    Refill:  2    This patient was seen by LaDrema DallasPA-C in collaboration with Dr. FoClayborn Bignesss a part of collaborative care agreement.   Total time spent:30 Minutes Time spent includes review of chart, medications, test results, and follow up plan with the patient.      Dr FoLavera Guisenternal medicine

## 2021-05-10 ENCOUNTER — Ambulatory Visit (INDEPENDENT_AMBULATORY_CARE_PROVIDER_SITE_OTHER): Payer: BLUE CROSS/BLUE SHIELD | Admitting: Physician Assistant

## 2021-05-10 ENCOUNTER — Other Ambulatory Visit: Payer: Self-pay

## 2021-05-10 ENCOUNTER — Encounter: Payer: Self-pay | Admitting: Physician Assistant

## 2021-05-10 DIAGNOSIS — E1165 Type 2 diabetes mellitus with hyperglycemia: Secondary | ICD-10-CM | POA: Diagnosis not present

## 2021-05-10 DIAGNOSIS — I83813 Varicose veins of bilateral lower extremities with pain: Secondary | ICD-10-CM

## 2021-05-10 DIAGNOSIS — I10 Essential (primary) hypertension: Secondary | ICD-10-CM | POA: Diagnosis not present

## 2021-05-10 DIAGNOSIS — M5442 Lumbago with sciatica, left side: Secondary | ICD-10-CM

## 2021-05-10 LAB — POCT GLYCOSYLATED HEMOGLOBIN (HGB A1C): Hemoglobin A1C: 7.7 % — AB (ref 4.0–5.6)

## 2021-05-10 NOTE — Progress Notes (Signed)
[facility] [place], [place] [address]  Internal MEDICINE  Office Visit Note  Patient Name: Regina Short  [id number]  [id number]  Date of Service: 05/10/2021  Chief Complaint  Patient presents with  . Follow-up    A1c check, pt says Wilder Glade is not helping and would like to be taken off j  . Diabetes  . Quality Metric Gaps    Eye exam    HPI Patient is here for routine follow-up -Farxiga 83m was too expensive, and 165mmade her feel bad and cutting them didn't seem to help, has not taken it for awhile. BG 74-149in AM, very variable, recently had a low during daytime due to not eating. Taking 2 tabs of the glyburide-metformin BID now since stopping Fa[place]-She works 12 hour shifts, Gets home at 7pm and doesn't eat anything other than maybe a peanut butter cracker or snack.  -BP at home [id number] lost her BF to stomach cancer and that has been weighing on her but states she is managing -She did stop smoking after last visit. -Leg tingling in left leg some and left low back pain--heating pad helps.  Patient wants to defer imaging or lab work at this time  Current Medication: Outpatient Encounter Medications as of 05/10/2021  Medication Sig  . benazepril (LOTENSIN) 10 MG tablet Take 1 tablet (10 mg total) by mouth daily.  . ergocalciferol (DRISDOL) 1.25 MG (50000 UT) capsule Take 1 capsule (50,000 Units total) by mouth once a week.  . fluticasone (FLONASE) 50 MCG/ACT nasal spray Place 2 sprays into both nostrils daily.  . Marland Kitchenlucose blood (ACCU-CHEK GUIDE) test strip Check blood sugars BID and prn - E11.65  . glyBURIDE-metformin (GLUCOVANCE) 5-500 MG tablet Take 1.5 tablets po BID or if blood sugar drops take 1 tablet po BID  . Lancets Misc. (ACCU-CHEK FASTCLIX LANCET) KIT Accu-Chek Fastclix [employer]. dapagliflozin propanediol (FARXIGA) 5 MG TABS tablet Take 1 tablet (5 mg total) by mouth daily. (Patient not taking: Reported on 05/10/2021)    No facility-administered encounter medications on file as of 05/10/2021.    Surgical History: Past Surgical History:  Procedure Laterality Date  . ABDOMINAL HYSTERECTOMY    . ABDOMINAL SURGERY     5 tumors removed  . BREAST BIOPSY Right 02/02/15 core   focal fibroadenomatoid changes and sclerosis    Medical History: Past Medical History:  Diagnosis Date  . Chronic back pain   . [place]  . GERD (gastroesophageal reflux disease)   . Hand tendonitis   . Hypertension     Family History: Family History  Problem Relation Age of Onset  . Congestive Heart Failure Other   . Breast cancer Maternal Aunt 60    Social History   Socioeconomic History  . Marital status: Single    Spouse name: Not on file  . Number of children: Not on file  . Years of education: Not on file  . Highest education level: Not on file  Occupational History  . Not on file  Tobacco Use  . Smoking status: Former Smoker    Packs/day: 0.50    Types: Cigarettes    Quit date: 03/08/2021    Years since quitting: 0.1  . Smokeless tobacco: Never Used  . Tobacco comment: pack of cigarettes last two days  Substance and Sexual Activity  . Alcohol use: Yes    Alcohol/week: 0.0 standard drinks    Comment: holidays  . Drug use:  No  . Sexual activity: Yes    Birth control/protection: None  Other Topics Concern  . Not on file  Social History Narrative  . Not on file   Social Determinants of Health   Financial Resource Strain: Not on file  Food Insecurity: Not on file  Transportation Needs: Not on file  Physical Activity: Not on file  Stress: Not on file  Social Connections: Not on file  Intimate Partner Violence: Not on file      Review of Systems  Constitutional: Negative for chills, fatigue and unexpected weight change.  HENT: Negative for congestion, postnasal drip, rhinorrhea, sneezing and sore throat.   Eyes: Negative for redness.  Respiratory:  Negative for cough, chest tightness and shortness of breath.   Cardiovascular: Negative for chest pain and palpitations.  Gastrointestinal: Negative for abdominal pain, constipation, diarrhea, nausea and vomiting.  Genitourinary: Negative for dysuria and frequency.  Musculoskeletal: Positive for back pain and myalgias. Negative for arthralgias, joint swelling and neck pain.  Skin: Negative for rash.  Neurological: Negative.  Negative for tremors and numbness.       Tingling in left leg  Hematological: Negative for adenopathy. Does not bruise/bleed easily.  Psychiatric/Behavioral: Negative for behavioral problems (Depression), sleep disturbance and suicidal ideas. The patient is not nervous/anxious.     Vital Signs: BP 138/82   Pulse 96   Temp 98.1 F (36.7 C)   Resp 16   Ht _0  (1.676 m)   Wt 140 lb (63.5 kg)   SpO2 90%   BMI 22.60 kg/m    Physical Exam Vitals and nursing note reviewed.  Constitutional:      General: She is not in acute distress.    Appearance: She is well-developed and normal weight. She is not diaphoretic.  HENT:     Head: Normocephalic and atraumatic.     Mouth/Throat:     Pharynx: No oropharyngeal exudate.  Eyes:     Pupils: Pupils are equal, round, and reactive to light.  Neck:     Thyroid: No thyromegaly.     Vascular: No JVD.     Trachea: No tracheal deviation.  Cardiovascular:     Rate and Rhythm: Normal rate and regular rhythm.     Heart sounds: Normal heart sounds. No murmur heard. No friction rub. No gallop.   Pulmonary:     Effort: Pulmonary effort is normal. No respiratory distress.     Breath sounds: No wheezing or rales.  Chest:     Chest wall: No tenderness.  Abdominal:     General: Bowel sounds are normal.     Palpations: Abdomen is soft.  Musculoskeletal:        General: Tenderness present. Normal range of motion.     Cervical back: Normal range of motion and neck supple.     Right lower leg: No edema.     Left lower leg: No  edema.     Comments: Some tenderness in left lumbar region  Lymphadenopathy:     Cervical: No cervical adenopathy.  Skin:    General: Skin is warm and dry.  Neurological:     Mental Status: She is alert and oriented to person, place, and time.     Cranial Nerves: No cranial nerve deficit.     Sensory: No sensory deficit.  Psychiatric:        Behavior: Behavior normal.        Thought Content: Thought content normal.        Judgment:  Judgment normal.        Assessment/Plan: 1. Type 2 diabetes mellitus with hyperglycemia, without long-term current use of insulin (HCC) - POCT HgB A1C is 7.7, which is improved from 8.3 at last visit.  Discussed with patient a need to change medications.  Will D/c Farxiga due to intolerance and cost.  Patient self increased to 2 tabs of glyburide- metformin twice daily.  Patient will continue on this however if still having abnormal readings or extreme fluctuations will need to alter therapy at next visit.  Patient encouraged to work on diet and exercise and educated that she needs to eat small meals throughout the day and not skip meals.  Patient will call if having numbers that are fluctuating to extremes.  2. Essential hypertension Stable continue Lotensin  3. Varicose veins of both lower extremities with pain Continue wearing compression stockings  4. Acute left-sided low back pain with left-sided sciatica Patient will continue to use heating pad as this has proved helpful, patient not interested in having imaging done of the lumbar spine and would like to hold off on obtaining lab work including B12 and magnesium until next visit   General Counseling: ellamae lybeck understanding of the findings of todays visit and agrees with plan of treatment. I have discussed any further diagnostic evaluation that may be needed or ordered today. We also reviewed her medications today. she has been encouraged to call the office with any questions or concerns that  should arise related to todays visit.    Orders Placed This Encounter  Procedures  . POCT HgB A1C    No orders of the defined types were placed in this encounter.   This patient was seen by Drema Dallas, PA-C in collaboration with Dr. Clayborn Bigness as a part of collaborative care agreement.   Total time spent:30 Minutes Time spent includes review of chart, medications, test results, and follow up plan with the patient.      Dr Lavera Guise Internal medicine

## 2021-08-04 ENCOUNTER — Other Ambulatory Visit: Payer: Self-pay

## 2021-08-04 DIAGNOSIS — Z87891 Personal history of nicotine dependence: Secondary | ICD-10-CM | POA: Diagnosis not present

## 2021-08-04 DIAGNOSIS — M79604 Pain in right leg: Secondary | ICD-10-CM | POA: Diagnosis not present

## 2021-08-04 DIAGNOSIS — R252 Cramp and spasm: Secondary | ICD-10-CM | POA: Diagnosis present

## 2021-08-04 DIAGNOSIS — Z79899 Other long term (current) drug therapy: Secondary | ICD-10-CM | POA: Insufficient documentation

## 2021-08-04 DIAGNOSIS — I10 Essential (primary) hypertension: Secondary | ICD-10-CM | POA: Diagnosis not present

## 2021-08-04 DIAGNOSIS — E860 Dehydration: Secondary | ICD-10-CM | POA: Insufficient documentation

## 2021-08-04 DIAGNOSIS — Z7984 Long term (current) use of oral hypoglycemic drugs: Secondary | ICD-10-CM | POA: Insufficient documentation

## 2021-08-04 DIAGNOSIS — E1165 Type 2 diabetes mellitus with hyperglycemia: Secondary | ICD-10-CM | POA: Insufficient documentation

## 2021-08-04 LAB — COMPREHENSIVE METABOLIC PANEL
ALT: 15 U/L (ref 0–44)
AST: 17 U/L (ref 15–41)
Albumin: 4 g/dL (ref 3.5–5.0)
Alkaline Phosphatase: 97 U/L (ref 38–126)
Anion gap: 7 (ref 5–15)
BUN: 14 mg/dL (ref 6–20)
CO2: 25 mmol/L (ref 22–32)
Calcium: 9.1 mg/dL (ref 8.9–10.3)
Chloride: 105 mmol/L (ref 98–111)
Creatinine, Ser: 0.7 mg/dL (ref 0.44–1.00)
GFR, Estimated: >60 mL/min (ref >60)
Glucose, Bld: 324 mg/dL — ABNORMAL HIGH (ref 70–99)
Potassium: 4.1 mmol/L (ref 3.5–5.1)
Sodium: 137 mmol/L (ref 135–145)
Total Bilirubin: 0.4 mg/dL (ref 0.3–1.2)
Total Protein: 6.5 g/dL (ref 6.5–8.1)

## 2021-08-04 LAB — CBC
HCT: 37.6 % (ref 36.0–46.0)
Hemoglobin: 12.3 g/dL (ref 12.0–15.0)
MCH: 27 pg (ref 26.0–34.0)
MCHC: 32.7 g/dL (ref 30.0–36.0)
MCV: 82.5 fL (ref 80.0–100.0)
Platelets: 242 10*3/uL (ref 150–400)
RBC: 4.56 MIL/uL (ref 3.87–5.11)
RDW: 16.1 % — ABNORMAL HIGH (ref 11.5–15.5)
WBC: 7.5 10*3/uL (ref 4.0–10.5)
nRBC: 0 % (ref 0.0–0.2)

## 2021-08-04 NOTE — ED Triage Notes (Signed)
Pt states has had three days of lower leg painful tingling and cramping. Pt states had diabetes. Pt states pain and cramping is worse after working in yard yesterday. Pt ambulatory without difficulty.

## 2021-08-05 ENCOUNTER — Emergency Department: Payer: BLUE CROSS/BLUE SHIELD

## 2021-08-05 ENCOUNTER — Emergency Department
Admission: EM | Admit: 2021-08-05 | Discharge: 2021-08-05 | Disposition: A | Discharge status: Home / Self Care | Payer: BLUE CROSS/BLUE SHIELD | Attending: Emergency Medicine | Admitting: Emergency Medicine

## 2021-08-05 DIAGNOSIS — E1165 Type 2 diabetes mellitus with hyperglycemia: Secondary | ICD-10-CM

## 2021-08-05 DIAGNOSIS — E860 Dehydration: Secondary | ICD-10-CM

## 2021-08-05 DIAGNOSIS — R252 Cramp and spasm: Secondary | ICD-10-CM

## 2021-08-05 LAB — CBG MONITORING, ED: Glucose-Capillary: 172 mg/dL — ABNORMAL HIGH (ref 70–99)

## 2021-08-05 MED ORDER — LACTATED RINGERS IV BOLUS
1000.0000 mL | Freq: Once | INTRAVENOUS | Status: AC
  Administered 2021-08-05: 1000 mL via INTRAVENOUS

## 2021-08-05 NOTE — ED Provider Notes (Signed)
Emergency Medicine Provider Triage Evaluation Note  Regina Short , a 60 y.o. female  was evaluated in triage.  Pt complains of b/l leg cramping and tingling, worse while working x 3 days. Stands for several hours daily. No h/o DVT/PE, no hormones.  Review of Systems  Positive: Leg pain and cramping Negative: CP, SOB  Physical Exam  BP (!) 147/105 (BP Location: Left Arm)   Pulse 92   Temp 98.7 F (37.1 C) (Oral)   Resp 18   Ht 5\' 6"  (1.676 m)   Wt 66.2 kg   SpO2 99%   BMI 23.57 kg/m  Gen:   Awake, no distress   Resp:  Normal effort  MSK:   Moves extremities without difficulty. Legs are warm and well perfused. Popliteal, DP, PT pulses faintly palpable and present with doppler, no asymmetric swelling and brisk cap refill b/l  Other:  No midline tenderness of the spine, normal DTRs b/l  Medical Decision Making  Medically screening exam initiated at 1:03 AM.  Appropriate orders placed.  Regina Short was informed that the remainder of the evaluation will be completed by another provider, this initial triage assessment does not replace that evaluation, and the importance of remaining in the ED until their evaluation is complete.  34F diabetic with 3 days of b/l leg tingling and cramps. Ddx dehydration, PAD, DVP, neuropathy.   Hyperglycemia with no DKA, no AKI, no leukocytosis.  Raquel James venus ordered. IVF for possible dehydration and hyperglycemia.     [place], MD 08/05/21 [phone]

## 2021-08-05 NOTE — ED Provider Notes (Signed)
[facility] Emergency Department Provider Note  ____________________________________________  Time seen: Approximately 2:51 AM  I have reviewed the triage vital signs and the nursing notes.   HISTORY  Chief Complaint leg cramping   HPI Regina Short is a 60 y.o. female history of chronic back pain, hypertension, diabetes, GERD who presents for evaluation of bilateral leg cramping and tingling.  Patient reports that she has been having muscle cramping on both her legs for the last 3 days.  It is worse when she is working.  She reports that she stands for several hours throughout the day.  She denies any weakness or numbness of the legs.  She denies any history of PE or DVT, no recent travel immobilization, no leg swelling, no hemoptysis, no exogenous hormones.  Patient does have a history of low back pain and denies any changes in that.  She denies saddle anesthesia, urinary or bowel incontinence or retention, bilateral lower extremity weakness   Past Medical History:  Diagnosis Date   Chronic back pain    Diabetes mellitus without complication (HCC)    GERD (gastroesophageal reflux disease)    Hand tendonitis    Hypertension     Patient Active Problem List   Diagnosis Date Noted   Smoking 01/04/2021   Acute upper respiratory infection 11/25/2020   Pain in both wrists 11/25/2020   Varicose veins of both lower extremities with inflammation 10/14/2020   Routine cervical smear 10/14/2020   Encounter for screening mammogram for malignant neoplasm of breast 10/14/2020   Flu vaccine need 10/14/2020   Leg cramps 01/29/2020   Pain in both feet 11/09/2019   Allergic rhinitis due to pollen 05/21/2019   Contact dermatitis and eczema due to detergents 05/18/2019   Left knee pain 02/18/2019   Vitamin D deficiency 11/12/2018   Osteoarthritis of knee 07/16/2018   Ingrown toenail without infection 07/09/2018   Dysuria 07/09/2018   Uncontrolled type 2 diabetes  mellitus with hyperglycemia ([place]) 04/02/2018   Essential hypertension 04/02/2018   Type 2 diabetes mellitus with hyperglycemia, without long-term current use of insulin ([place]) 04/02/2018   Diabetes mellitus without complication ([place]) 35/46/5681   Encounter for general adult medical examination with abnormal findings 04/02/2018   Acquired trigger finger 11/07/2016   Pes anserinus bursitis 08/03/2015   SBO (small bowel obstruction) ([place])    Obstruction of intestine ([place]) 07/25/2015   Impingement syndrome of left shoulder 02/23/2015   Left supraspinatus tenosynovitis 02/23/2015    Past Surgical History:  Procedure Laterality Date   ABDOMINAL HYSTERECTOMY     ABDOMINAL SURGERY     5 tumors removed   BREAST BIOPSY Right 02/02/15 core   focal fibroadenomatoid changes and sclerosis    Prior to Admission medications   Medication Sig Start Date End Date Taking? Authorizing Provider  benazepril (LOTENSIN) 10 MG tablet Take 1 tablet (10 mg total) by mouth daily. 01/04/21   Lavera Guise, MD  dapagliflozin propanediol (FARXIGA) 5 MG TABS tablet Take 1 tablet (5 mg total) by mouth daily. Patient not taking: Reported on 05/10/2021 03/08/21   Mylinda Latina, PA-C  ergocalciferol (DRISDOL) 1.25 MG (50000 UT) capsule Take 1 capsule (50,000 Units total) by mouth once a week. 04/19/20   Ronnell Freshwater, NP  fluticasone (FLONASE) 50 MCG/ACT nasal spray Place 2 sprays into both nostrils daily. 05/20/19   Ronnell Freshwater, NP  glucose blood (ACCU-CHEK GUIDE) test strip Check blood sugars BID and prn - E11.65 03/08/21   McDonough,  Lauren K, PA-C  glyBURIDE-metformin (GLUCOVANCE) 5-500 MG tablet Take 1.5 tablets po BID or if blood sugar drops take 1 tablet po BID 01/25/21   Lavera Guise, MD  Lancets Misc. (ACCU-CHEK FASTCLIX LANCET) KIT Accu-Chek Fastclix Lancet Drum    [provider]    Allergies Micardis [telmisartan], Ciprofloxacin, and Tramadol  Family History  Problem Relation Age of  Onset   Congestive Heart Failure Other    Breast cancer Maternal Aunt 60    Social History Social History   Tobacco Use   Smoking status: Former    Packs/day: 0.50    Types: Cigarettes    Quit date: 03/08/2021    Years since quitting: 0.4   Smokeless tobacco: Never   Tobacco comments:    pack of cigarettes last two days  Substance Use Topics   Alcohol use: Yes    Alcohol/week: 0.0 standard drinks    Comment: holidays   Drug use: No    Review of Systems  Constitutional: Negative for fever. Eyes: Negative for visual changes. ENT: Negative for sore throat. Neck: No neck pain  Cardiovascular: Negative for chest pain. Respiratory: Negative for shortness of breath. Gastrointestinal: Negative for abdominal pain, vomiting or diarrhea. Genitourinary: Negative for dysuria. Musculoskeletal: Negative for back pain. + b/l leg cramping and tingling Skin: Negative for rash. Neurological: Negative for headaches, weakness or numbness. Psych: No SI or HI  ____________________________________________   PHYSICAL EXAM:  VITAL SIGNS: ED Triage Vitals  Enc Vitals Group     BP 08/04/21 1920 (!) 147/105     Pulse Rate 08/04/21 1920 92     Resp 08/04/21 1920 18     Temp 08/04/21 1920 98.7 F (37.1 C)     Temp Source 08/04/21 1920 Oral     SpO2 08/04/21 1920 99 %     Weight 08/04/21 1919 146 lb (66.2 kg)     Height 08/04/21 1919 '5\' 6"'  (1.676 m)     Head Circumference --      Peak Flow --      Pain Score 08/04/21 1923 10     Pain Loc --      Pain Edu? --      Excl. in [place]? --     Constitutional: Alert and oriented. Well appearing and in no apparent distress. HEENT:      Head: Normocephalic and atraumatic.         Eyes: Conjunctivae are normal. Sclera is non-icteric.       Mouth/Throat: Mucous membranes are moist.       Neck: Supple with no signs of meningismus. Cardiovascular: Regular rate and rhythm. No murmurs, gallops, or rubs. 2+ symmetrical distal pulses are present in  all extremities. No JVD. Respiratory: Normal respiratory effort. Lungs are clear to auscultation bilaterally.  Gastrointestinal: Soft, non tender. Musculoskeletal:  No edema, cyanosis, or erythema of extremities.  Both lower extremities are warm and well-perfused.  She has strong femoral pulses that are palpable.  She has faintly palpable DP, PT, and popliteal pulses which are present with Doppler studies.  She has brisk capillary refill distally.  There is no midline spine tenderness Neurologic: Normal speech and language. Face is symmetric. Moving all extremities. No gross focal neurologic deficits are appreciated. Skin: Skin is warm, dry and intact. No rash noted. Psychiatric: Mood and affect are normal. Speech and behavior are normal.  ____________________________________________   LABS (all labs ordered are listed, but only abnormal results are displayed)  Labs Reviewed  CBC -  Abnormal; Notable for the following components:      Result Value   RDW 16.1 (*)    All other components within normal limits  COMPREHENSIVE METABOLIC PANEL - Abnormal; Notable for the following components:   Glucose, Bld 324 (*)    All other components within normal limits  CBG MONITORING, ED - Abnormal; Notable for the following components:   Glucose-Capillary 172 (*)    All other components within normal limits  URINALYSIS, COMPLETE (UACMP) WITH MICROSCOPIC  CBG MONITORING, ED   ____________________________________________  EKG  none  ____________________________________________  RADIOLOGY  I have personally reviewed the images performed during this visit and I agree with the Radiologist's read.   Interpretation by Radiologist:  US Venous Img Lower Bilateral  Result Date: 08/05/2021 CLINICAL DATA:  Lower extremity pain and tingling EXAM: BILATERAL LOWER EXTREMITY VENOUS DOPPLER ULTRASOUND TECHNIQUE: Gray-scale sonography with graded compression, as well as color Doppler and duplex ultrasound  were performed to evaluate the lower extremity deep venous systems from the level of the common femoral vein and including the common femoral, femoral, profunda femoral, popliteal and calf veins including the posterior tibial, peroneal and gastrocnemius veins when visible. The superficial great saphenous vein was also interrogated. Spectral Doppler was utilized to evaluate flow at rest and with distal augmentation maneuvers in the common femoral, femoral and popliteal veins. COMPARISON:  None. FINDINGS: RIGHT LOWER EXTREMITY Common Femoral Vein: No evidence of thrombus. Normal compressibility, respiratory phasicity and response to augmentation. Saphenofemoral Junction: No evidence of thrombus. Normal compressibility and flow on color Doppler imaging. Profunda Femoral Vein: No evidence of thrombus. Normal compressibility and flow on color Doppler imaging. Femoral Vein: No evidence of thrombus. Normal compressibility, respiratory phasicity and response to augmentation. Popliteal Vein: No evidence of thrombus. Normal compressibility, respiratory phasicity and response to augmentation. Calf Veins: No evidence of thrombus. Normal compressibility and flow on color Doppler imaging. Superficial Great Saphenous Vein: No evidence of thrombus. Normal compressibility. Venous Reflux:  None. Other Findings:  None. LEFT LOWER EXTREMITY Common Femoral Vein: No evidence of thrombus. Normal compressibility, respiratory phasicity and response to augmentation. Saphenofemoral Junction: No evidence of thrombus. Normal compressibility and flow on color Doppler imaging. Profunda Femoral Vein: No evidence of thrombus. Normal compressibility and flow on color Doppler imaging. Femoral Vein: No evidence of thrombus. Normal compressibility, respiratory phasicity and response to augmentation. Popliteal Vein: No evidence of thrombus. Normal compressibility, respiratory phasicity and response to augmentation. Calf Veins: No evidence of thrombus.  Normal compressibility and flow on color Doppler imaging. Superficial Great Saphenous Vein: No evidence of thrombus. Normal compressibility. Venous Reflux:  None. Other Findings:  None. IMPRESSION: No evidence of deep venous thrombosis in either lower extremity. Electronically Signed   By: Inez Catalina M.D.   On: 08/05/2021 01:52     ____________________________________________   PROCEDURES  Procedure(s) performed: None Procedures Critical Care performed:  None ____________________________________________   INITIAL IMPRESSION / ASSESSMENT AND PLAN / ED COURSE  60 y.o. female history of chronic back pain, hypertension, diabetes, GERD who presents for evaluation of bilateral leg cramping and tingling x 3 days.  Patient is well-appearing in no distress with normal vital signs.  On exam legs are warm and well perfused with dopplerable pulses throughout and brisk capillary refill.  There is no signs of cellulitis, no signs of arterial ischemia, no deformities, no signs of cauda equina  Differential diagnoses including dehydration, peripheral artery disease, DVT, neuropathy   Labs reviewed by me showing hyperglycemia with no signs of  DKA.  No signs of AKI.  No leukocytosis.  Ultrasound venous Doppler negative for DVT bilaterally.  After receiving a liter of IV fluids patient reports resolution of her symptoms and feels markedly improved.  Her repeat blood glucose is markedly improved.  Most likely dehydration.  Recommend increase oral hydration and follow-up with PCP.  Did discuss with patient if the symptom starts to get worse that she may need a referral to vascular surgery but there are no indications for emergent referral at this time with normal pulses in her extremities.  We did discuss my standard return precautions.  Chart reviewed      _____________________________________________ Please note:  Patient was evaluated in Emergency Department today for the symptoms described in the  history of present illness. Patient was evaluated in the context of the global COVID-19 pandemic, which necessitated consideration that the patient might be at risk for infection with the SARS-CoV-2 virus that causes COVID-19. Institutional protocols and algorithms that pertain to the evaluation of patients at risk for COVID-19 are in a state of rapid change based on information released by regulatory bodies including the CDC and federal and state organizations. These policies and algorithms were followed during the patient's care in the ED.  Some ED evaluations and interventions may be delayed as a result of limited staffing during the pandemic.   [place] Controlled Substance Database was reviewed by me. ____________________________________________   FINAL CLINICAL IMPRESSION(S) / ED DIAGNOSES   Final diagnoses:  Dehydration  Muscle cramps  Type 2 diabetes mellitus with hyperglycemia, unspecified whether long term insulin use ([place])      NEW MEDICATIONS STARTED DURING THIS VISIT:  ED Discharge Orders     None        Note:  This document was prepared using Dragon voice recognition software and may include unintentional dictation errors.    Rudene Re, MD 08/05/21 [phone]

## 2021-08-09 ENCOUNTER — Other Ambulatory Visit: Payer: Self-pay

## 2021-08-09 ENCOUNTER — Ambulatory Visit (INDEPENDENT_AMBULATORY_CARE_PROVIDER_SITE_OTHER): Payer: BLUE CROSS/BLUE SHIELD | Admitting: Physician Assistant

## 2021-08-09 ENCOUNTER — Telehealth: Payer: Self-pay

## 2021-08-09 ENCOUNTER — Encounter: Payer: Self-pay | Admitting: Physician Assistant

## 2021-08-09 DIAGNOSIS — E782 Mixed hyperlipidemia: Secondary | ICD-10-CM | POA: Diagnosis not present

## 2021-08-09 DIAGNOSIS — E1165 Type 2 diabetes mellitus with hyperglycemia: Secondary | ICD-10-CM | POA: Diagnosis not present

## 2021-08-09 DIAGNOSIS — I10 Essential (primary) hypertension: Secondary | ICD-10-CM | POA: Diagnosis not present

## 2021-08-09 DIAGNOSIS — I83813 Varicose veins of bilateral lower extremities with pain: Secondary | ICD-10-CM

## 2021-08-09 DIAGNOSIS — Z1211 Encounter for screening for malignant neoplasm of colon: Secondary | ICD-10-CM

## 2021-08-09 DIAGNOSIS — Z1212 Encounter for screening for malignant neoplasm of rectum: Secondary | ICD-10-CM

## 2021-08-09 DIAGNOSIS — R252 Cramp and spasm: Secondary | ICD-10-CM

## 2021-08-09 DIAGNOSIS — E538 Deficiency of other specified B group vitamins: Secondary | ICD-10-CM

## 2021-08-09 DIAGNOSIS — R5383 Other fatigue: Secondary | ICD-10-CM

## 2021-08-09 DIAGNOSIS — F17200 Nicotine dependence, unspecified, uncomplicated: Secondary | ICD-10-CM

## 2021-08-09 DIAGNOSIS — Z23 Encounter for immunization: Secondary | ICD-10-CM

## 2021-08-09 DIAGNOSIS — E559 Vitamin D deficiency, unspecified: Secondary | ICD-10-CM

## 2021-08-09 LAB — POCT GLYCOSYLATED HEMOGLOBIN (HGB A1C): Hemoglobin A1C: 7.7 % — AB (ref 4.0–5.6)

## 2021-08-09 MED ORDER — TETANUS-DIPHTH-ACELL PERTUSSIS 5-2.5-18.5 LF-MCG/0.5 IM SUSP: 0.5000 mL | Freq: Once | INTRAMUSCULAR | 0 refills | Status: AC

## 2021-08-09 MED ORDER — EMPAGLIFLOZIN 10 MG PO TABS: 10.0000 mg | ORAL_TABLET | Freq: Every day | ORAL | 2 refills | Status: DC

## 2021-08-09 MED ORDER — ACCU-CHEK GUIDE VI STRP: ORAL_STRIP | 3 refills | Status: DC

## 2021-08-09 NOTE — Progress Notes (Signed)
[facility] [place], [place] [address]  Internal MEDICINE  Office Visit Note  Patient Name: Regina Short  [id number]  [id number]  Date of Service: 08/09/2021  Chief Complaint  Patient presents with   Follow-up    ED follow up   Diabetes   Hypertension   Quality Metric Gaps    Colonoscopy,diabetic eye     HPI Pt is here for routine follow up -Went to ED for leg cramping and pain and was found to be dehydrated. DVT ruled out bilaterally in ED. Potassium levels normal on labs. She was recommended to go establish with vascular surgery and requests referral today. -No med changes from ED except IV fluids -BP stable -BG 120-160. Taking glyburide-metformin. Stopped Wilder Glade because she states it made her not feel well.  -Discussed trying alternative to [place] with jardiance samples given x 2 weeks and script sent -Due for colonoscopy and tdap -Will order routine fasting labs to be done prior to CPE next visit  Current Medication: Outpatient Encounter Medications as of 08/09/2021  Medication Sig   benazepril (LOTENSIN) 10 MG tablet Take 1 tablet (10 mg total) by mouth daily.   empagliflozin (JARDIANCE) 10 MG TABS tablet Take 1 tablet (10 mg total) by mouth daily before breakfast.   ergocalciferol (DRISDOL) 1.25 MG (50000 UT) capsule Take 1 capsule (50,000 Units total) by mouth once a week.   fluticasone (FLONASE) 50 MCG/ACT nasal spray Place 2 sprays into both nostrils daily.   glyBURIDE-metformin (GLUCOVANCE) 5-500 MG tablet Take 1.5 tablets po BID or if blood sugar drops take 1 tablet po BID   Lancets Misc. (ACCU-CHEK FASTCLIX LANCET) KIT Accu-Chek Fastclix Lancet Drum   [DISCONTINUED] glucose blood (ACCU-CHEK GUIDE) test strip Check blood sugars BID and prn - E11.65   [DISCONTINUED] Tdap (BOOSTRIX) 5-2.5-18.5 LF-MCG/0.5 injection Inject 0.5 mLs into the muscle once.   glucose blood (ACCU-CHEK GUIDE) test strip Check blood sugars BID and prn - E11.65    Tdap (BOOSTRIX) 5-2.5-18.5 LF-MCG/0.5 injection Inject 0.5 mLs into the muscle once for 1 dose.   [DISCONTINUED] dapagliflozin propanediol (FARXIGA) 5 MG TABS tablet Take 1 tablet (5 mg total) by mouth daily. (Patient not taking: No sig reported)   No facility-administered encounter medications on file as of 08/09/2021.    Surgical History: Past Surgical History:  Procedure Laterality Date   ABDOMINAL HYSTERECTOMY     ABDOMINAL SURGERY     5 tumors removed   BREAST BIOPSY Right 02/02/15 core   focal fibroadenomatoid changes and sclerosis    Medical History: Past Medical History:  Diagnosis Date   Chronic back pain    Diabetes mellitus without complication (HCC)    GERD (gastroesophageal reflux disease)    Hand tendonitis    Hypertension     Family History: Family History  Problem Relation Age of Onset   Congestive Heart Failure Other    Breast cancer Maternal Aunt 60    Social History   Socioeconomic History   Marital status: Single    Spouse name: Not on file   Number of children: Not on file   Years of education: Not on file   Highest education level: Not on file  Occupational History   Not on file  Tobacco Use   Smoking status: Former    Packs/day: 0.50    Types: Cigarettes    Quit date: 03/08/2021    Years since quitting: 0.4   Smokeless tobacco: Never   Tobacco comments:    pack  of cigarettes last two days  Substance and Sexual Activity   Alcohol use: Yes    Alcohol/week: 0.0 standard drinks    Comment: holidays   Drug use: No   Sexual activity: Yes    Birth control/protection: None  Other Topics Concern   Not on file  Social History Narrative   Not on file   Social Determinants of Health   Financial Resource Strain: Not on file  Food Insecurity: Not on file  Transportation Needs: Not on file  Physical Activity: Not on file  Stress: Not on file  Social Connections: Not on file  Intimate Partner Violence: Not on file      Review of  Systems  Constitutional:  Positive for fatigue. Negative for chills and unexpected weight change.  HENT:  Negative for congestion, postnasal drip, rhinorrhea, sneezing and sore throat.   Eyes:  Negative for redness.  Respiratory:  Negative for cough, chest tightness and shortness of breath.   Cardiovascular:  Negative for chest pain and palpitations.  Gastrointestinal:  Negative for abdominal pain, constipation, diarrhea, nausea and vomiting.  Genitourinary:  Negative for dysuria and frequency.  Musculoskeletal:  Positive for arthralgias and myalgias. Negative for back pain, joint swelling and neck pain.  Skin:  Negative for rash.  Neurological: Negative.  Negative for tremors and numbness.  Hematological:  Negative for adenopathy. Does not bruise/bleed easily.  Psychiatric/Behavioral:  Negative for behavioral problems (Depression), sleep disturbance and suicidal ideas. The patient is not nervous/anxious.    Vital Signs: BP 140/78   Pulse 62   Temp 97.8 F (36.6 C)   Resp 16   Ht _0  (1.676 m)   Wt 141 lb 3.2 oz (64 kg)   SpO2 98%   BMI 22.79 kg/m    Physical Exam Vitals and nursing note reviewed.  Constitutional:      General: She is not in acute distress.    Appearance: She is well-developed and normal weight. She is not diaphoretic.  HENT:     Head: Normocephalic and atraumatic.     Mouth/Throat:     Pharynx: No oropharyngeal exudate.  Eyes:     Pupils: Pupils are equal, round, and reactive to light.  Neck:     Thyroid: No thyromegaly.     Vascular: No JVD.     Trachea: No tracheal deviation.  Cardiovascular:     Rate and Rhythm: Normal rate and regular rhythm.     Heart sounds: Normal heart sounds. No murmur heard.   No friction rub. No gallop.  Pulmonary:     Effort: Pulmonary effort is normal. No respiratory distress.     Breath sounds: No wheezing or rales.  Chest:     Chest wall: No tenderness.  Abdominal:     General: Bowel sounds are normal.      Palpations: Abdomen is soft.  Musculoskeletal:        General: Normal range of motion.     Cervical back: Normal range of motion and neck supple.     Right lower leg: No edema.     Left lower leg: No edema.  Lymphadenopathy:     Cervical: No cervical adenopathy.  Skin:    General: Skin is warm and dry.  Neurological:     Mental Status: She is alert and oriented to person, place, and time.     Cranial Nerves: No cranial nerve deficit.  Psychiatric:        Behavior: Behavior normal.  Thought Content: Thought content normal.        Judgment: Judgment normal.       Assessment/Plan: 1. Type 2 diabetes mellitus with hyperglycemia, without long-term current use of insulin (HCC) - POCT HgB A1C is 7.7 today which is stable from last visit. Pt had stopped farxiga and will instead add jardiance with samples given in office today. Continue metformin-glyburide as before. - empagliflozin (JARDIANCE) 10 MG TABS tablet; Take 1 tablet (10 mg total) by mouth daily before breakfast.  Dispense: 30 tablet; Refill: 2 - glucose blood (ACCU-CHEK GUIDE) test strip; Check blood sugars BID and prn - E11.65  Dispense: 100 each; Refill: 3  2. Essential hypertension Stable, continue lotensin  3. Mixed hyperlipidemia Will update labs--will need to consider statin based on labs as well as concurrent diabetes - Lipid Panel With LDL/HDL Ratio  4. Nicotine dependence with current use Continue to smoke, working to quit completely.  - Ambulatory referral to Vascular Surgery  5. Leg cramps Improved with IV fluids after ED visit, however still has pain in LE at times, will refer to vascular given additional varicosities - Ambulatory referral to Vascular Surgery  6. Varicose veins of both lower extremities with pain Due to leg pain will refer to vascular for further eval and management - Ambulatory referral to Vascular Surgery  7. Screening for colorectal cancer - Ambulatory referral to  Gastroenterology  8. B12 deficiency - B12 and Folate Panel  9. Vitamin D deficiency - VITAMIN D 25 Hydroxy (Vit-D Deficiency, Fractures)  10. Need for Tdap vaccination - Tdap ([place]) 5-2.5-18.5 LF-MCG/0.5 injection; Inject 0.5 mLs into the muscle once for 1 dose.  Dispense: 0.5 mL; Refill: 0  11. Other fatigue - CBC w/Diff/Platelet - Comprehensive metabolic panel - Iron, TIBC and Ferritin Panel - TSH + free T4   General Counseling: Nadine verbalizes understanding of the findings of todays visit and agrees with plan of treatment. I have discussed any further diagnostic evaluation that may be needed or ordered today. We also reviewed her medications today. she has been encouraged to call the office with any questions or concerns that should arise related to todays visit.    Orders Placed This Encounter  Procedures   CBC w/Diff/Platelet   Comprehensive metabolic panel   Lipid Panel With LDL/HDL Ratio   Iron, TIBC and Ferritin Panel   TSH + free T4   VITAMIN D 25 Hydroxy (Vit-D Deficiency, Fractures)   B12 and Folate Panel   Ambulatory referral to Gastroenterology   Ambulatory referral to Vascular Surgery   POCT HgB A1C    Meds ordered this encounter  Medications   Tdap (BOOSTRIX) 5-2.5-18.5 LF-MCG/0.5 injection    Sig: Inject 0.5 mLs into the muscle once for 1 dose.    Dispense:  0.5 mL    Refill:  0   empagliflozin (JARDIANCE) 10 MG TABS tablet    Sig: Take 1 tablet (10 mg total) by mouth daily before breakfast.    Dispense:  30 tablet    Refill:  2   glucose blood (ACCU-CHEK GUIDE) test strip    Sig: Check blood sugars BID and prn - E11.65    Dispense:  100 each    Refill:  3    This patient was seen by Drema Dallas, PA-C in collaboration with Dr. Clayborn Bigness as a part of collaborative care agreement.   Total time spent:35 Minutes Time spent includes review of chart, medications, test results, and follow up plan with the patient.  Dr Lavera Guise Internal medicine

## 2021-08-09 NOTE — Telephone Encounter (Signed)
Pt advised she need to call her insurance see which is covered and also she had sample ready for pickup

## 2021-08-10 LAB — CBC WITH DIFFERENTIAL/PLATELET
Basophils Absolute: 0 10*3/uL (ref 0.0–0.2)
Basos: 1 %
EOS (ABSOLUTE): 0.1 10*3/uL (ref 0.0–0.4)
Eos: 2 %
Hematocrit: 40.5 % (ref 34.0–46.6)
Hemoglobin: 13.1 g/dL (ref 11.1–15.9)
Immature Grans (Abs): 0 10*3/uL (ref 0.0–0.1)
Immature Granulocytes: 1 %
Lymphocytes Absolute: 2.3 10*3/uL (ref 0.7–3.1)
Lymphs: 38 %
MCH: 25.9 pg — ABNORMAL LOW (ref 26.6–33.0)
MCHC: 32.3 g/dL (ref 31.5–35.7)
MCV: 80 fL (ref 79–97)
Monocytes Absolute: 0.3 10*3/uL (ref 0.1–0.9)
Monocytes: 6 %
Neutrophils Absolute: 3.1 10*3/uL (ref 1.4–7.0)
Neutrophils: 52 %
Platelets: 252 10*3/uL (ref 150–450)
RBC: 5.05 x10E6/uL (ref 3.77–5.28)
RDW: 15.9 % — ABNORMAL HIGH (ref 11.7–15.4)
WBC: 5.9 10*3/uL (ref 3.4–10.8)

## 2021-08-10 LAB — IRON,TIBC AND FERRITIN PANEL
Ferritin: 107 ng/mL (ref 15–150)
Iron Saturation: 28 % (ref 15–55)
Iron: 91 ug/dL (ref 27–159)
Total Iron Binding Capacity: 330 ug/dL (ref 250–450)
UIBC: 239 ug/dL (ref 131–425)

## 2021-08-10 LAB — COMPREHENSIVE METABOLIC PANEL
ALT: 12 IU/L (ref 0–32)
AST: 16 IU/L (ref 0–40)
Albumin/Globulin Ratio: 2.5 — ABNORMAL HIGH (ref 1.2–2.2)
Albumin: 4.7 g/dL (ref 3.8–4.9)
Alkaline Phosphatase: 115 IU/L (ref 44–121)
BUN/Creatinine Ratio: 22 (ref 12–28)
BUN: 13 mg/dL (ref 8–27)
Bilirubin Total: 0.2 mg/dL (ref 0.0–1.2)
CO2: 23 mmol/L (ref 20–29)
Calcium: 9.9 mg/dL (ref 8.7–10.3)
Chloride: 100 mmol/L (ref 96–106)
Creatinine, Ser: 0.6 mg/dL (ref 0.57–1.00)
Globulin, Total: 1.9 g/dL (ref 1.5–4.5)
Glucose: 184 mg/dL — ABNORMAL HIGH (ref 65–99)
Potassium: 4.6 mmol/L (ref 3.5–5.2)
Sodium: 139 mmol/L (ref 134–144)
Total Protein: 6.6 g/dL (ref 6.0–8.5)
eGFR: 103 mL/min/{1.73_m2} (ref >59)

## 2021-08-10 LAB — B12 AND FOLATE PANEL
Folate: 10.9 ng/mL (ref >3.0)
Vitamin B-12: 1204 pg/mL (ref 232–1245)

## 2021-08-10 LAB — VITAMIN D 25 HYDROXY (VIT D DEFICIENCY, FRACTURES): Vit D, 25-Hydroxy: 49.4 ng/mL (ref 30.0–100.0)

## 2021-08-10 LAB — LIPID PANEL WITH LDL/HDL RATIO
Cholesterol, Total: 201 mg/dL — ABNORMAL HIGH (ref 100–199)
HDL: 81 mg/dL (ref >39)
LDL Chol Calc (NIH): 107 mg/dL — ABNORMAL HIGH (ref 0–99)
LDL/HDL Ratio: 1.3 ratio (ref 0.0–3.2)
Triglycerides: 74 mg/dL (ref 0–149)
VLDL Cholesterol Cal: 13 mg/dL (ref 5–40)

## 2021-08-10 LAB — TSH+FREE T4
Free T4: 1.34 ng/dL (ref 0.82–1.77)
TSH: 0.978 u[IU]/mL (ref 0.450–4.500)

## 2021-08-21 ENCOUNTER — Telehealth: Payer: Self-pay

## 2021-08-21 NOTE — Telephone Encounter (Signed)
[facility] gastroenterology referral failed to go via [employer]. Manually faxed to [phone]

## 2021-08-23 ENCOUNTER — Other Ambulatory Visit: Payer: Self-pay

## 2021-08-23 ENCOUNTER — Ambulatory Visit (INDEPENDENT_AMBULATORY_CARE_PROVIDER_SITE_OTHER): Payer: BLUE CROSS/BLUE SHIELD | Admitting: Nurse Practitioner

## 2021-08-23 ENCOUNTER — Encounter (INDEPENDENT_AMBULATORY_CARE_PROVIDER_SITE_OTHER): Payer: Self-pay | Admitting: Nurse Practitioner

## 2021-08-23 VITALS — BP 152/89 | HR 70 | Resp 16 | Ht 66.0 in | Wt 140.6 lb

## 2021-08-23 DIAGNOSIS — R252 Cramp and spasm: Secondary | ICD-10-CM | POA: Diagnosis not present

## 2021-08-23 DIAGNOSIS — F17200 Nicotine dependence, unspecified, uncomplicated: Secondary | ICD-10-CM | POA: Diagnosis not present

## 2021-08-23 DIAGNOSIS — I10 Essential (primary) hypertension: Secondary | ICD-10-CM | POA: Diagnosis not present

## 2021-08-23 DIAGNOSIS — E119 Type 2 diabetes mellitus without complications: Secondary | ICD-10-CM | POA: Diagnosis not present

## 2021-08-26 ENCOUNTER — Encounter (INDEPENDENT_AMBULATORY_CARE_PROVIDER_SITE_OTHER): Payer: Self-pay | Admitting: Nurse Practitioner

## 2021-08-26 NOTE — Progress Notes (Signed)
Subjective:    Patient ID: Regina Short, female    DOB: [birth year], 60 y.o.   MRN: [id number] Chief Complaint  Patient presents with   New Patient (Initial Visit)    Ref [facility] for varicose veins with pain    Regina Short is a 60 year old female that is seen for evaluation of painful lower extremities and diminished pulses. Patient notes the pain is always associated with activity and is very consistent day today. Typically, the pain occurs at less than one block, progress is as activity continues to the point that the patient must stop walking. Resting including standing still for several minutes allowed resumption of the activity and the ability to walk a similar distance before stopping again. Uneven terrain and inclined shorten the distance. The pain has been progressive over the past several years. The patient states the inability to walk is now having a profound negative impact on quality of life and daily activities.  The patient also has severe cramping during the evening.  The patient denies rest pain or dangling of an extremity off the side of the bed during the night for relief. No open wounds or sores at this time. No prior interventions or surgeries.  No history of back problems or DJD of the lumbar sacral spine.     The patient's blood pressure has been stable and relatively well controlled. The patient denies amaurosis fugax or recent TIA symptoms. There are no recent neurological changes noted. The patient denies history of DVT, PE or superficial thrombophlebitis. The patient denies recent episodes of angina or shortness of breath.     Review of Systems  Cardiovascular:        Claudication  Skin:  Negative for wound.  All other systems reviewed and are negative.     Objective:   Physical Exam Vitals reviewed.  HENT:     Head: Normocephalic.  Cardiovascular:     Rate and Rhythm: Normal rate.     Comments: Nonpalpable pedal pulses Pulmonary:     Effort:  Pulmonary effort is normal.  Skin:    General: Skin is cool and dry.  Neurological:     Mental Status: She is alert and oriented to person, place, and time.  Psychiatric:        Mood and Affect: Mood normal.        Behavior: Behavior normal.        Thought Content: Thought content normal.        Judgment: Judgment normal.    BP (!) 152/89 (BP Location: Right Arm)   Pulse 70   Resp 16   Ht '5\' 6"'  (1.676 m)   Wt 140 lb 9.6 oz (63.8 kg)   BMI 22.69 kg/m   Past Medical History:  Diagnosis Date   Chronic back pain    Diabetes mellitus without complication (HCC)    GERD (gastroesophageal reflux disease)    Hand tendonitis    Hypertension     Social History   Socioeconomic History   Marital status: Single    Spouse name: Not on file   Number of children: Not on file   Years of education: Not on file   Highest education level: Not on file  Occupational History   Not on file  Tobacco Use   Smoking status: Some Days    Packs/day: 0.50    Types: Cigarettes    Last attempt to quit: 03/08/2021    Years since quitting: 0.4   Smokeless tobacco: Never  Tobacco comments:    pack of cigarettes last two days  Substance and Sexual Activity   Alcohol use: Yes    Alcohol/week: 0.0 standard drinks    Comment: holidays   Drug use: No   Sexual activity: Yes    Birth control/protection: None  Other Topics Concern   Not on file  Social History Narrative   Not on file   Social Determinants of Health   Financial Resource Strain: Not on file  Food Insecurity: Not on file  Transportation Needs: Not on file  Physical Activity: Not on file  Stress: Not on file  Social Connections: Not on file  Intimate Partner Violence: Not on file    Past Surgical History:  Procedure Laterality Date   ABDOMINAL HYSTERECTOMY     ABDOMINAL SURGERY     5 tumors removed   BREAST BIOPSY Right 02/02/15 core   focal fibroadenomatoid changes and sclerosis    Family History  Problem Relation  Age of Onset   Congestive Heart Failure Other    Breast cancer Maternal Aunt 60    Allergies  Allergen Reactions   Micardis [Telmisartan]     rash   Ciprofloxacin Rash   Tramadol Rash    CBC Latest Ref Rng & Units 08/09/2021 08/04/2021 06/22/2020  WBC 3.4 - 10.8 x10E3/uL 5.9 7.5 6.7  Hemoglobin 11.1 - 15.9 g/dL 13.1 12.3 13.4  Hematocrit 34.0 - 46.6 % 40.5 37.6 40.7  Platelets 150 - 450 x10E3/uL 252 242 265      CMP     Component Value Date/Time   NA 139 08/09/2021 0928   NA 137 01/03/2013 2300   K 4.6 08/09/2021 0928   K 3.8 01/03/2013 2300   CL 100 08/09/2021 0928   CL 107 01/03/2013 2300   CO2 23 08/09/2021 0928   CO2 21 01/03/2013 2300   GLUCOSE 184 (H) 08/09/2021 0928   GLUCOSE 324 (H) 08/04/2021 1920   GLUCOSE 273 (H) 01/03/2013 2300   BUN 13 08/09/2021 0928   BUN 12 01/03/2013 2300   CREATININE 0.60 08/09/2021 0928   CREATININE 0.57 (L) 01/03/2013 2300   CALCIUM 9.9 08/09/2021 0928   CALCIUM 8.2 (L) 01/03/2013 2300   PROT 6.6 08/09/2021 0928   PROT 8.2 01/03/2013 1608   ALBUMIN 4.7 08/09/2021 0928   ALBUMIN 4.5 01/03/2013 1608   AST 16 08/09/2021 0928   AST 16 01/03/2013 1608   ALT 12 08/09/2021 0928   ALT 27 01/03/2013 1608   ALKPHOS 115 08/09/2021 0928   ALKPHOS 194 (H) 01/03/2013 1608   BILITOT 0.2 08/09/2021 0928   BILITOT 0.4 01/03/2013 1608   GFRNONAA >60 08/04/2021 1920   GFRNONAA >60 01/03/2013 2300   GFRAA 117 06/22/2020 0932   GFRAA >60 01/03/2013 2300     No results found.     Assessment & Plan:   1. Leg cramps  Recommend:  The patient has atypical pain symptoms for pure atherosclerotic disease. However, on physical exam there is evidence of mixed venous and arterial disease, given the diminished pulses and the edema associated with venous changes of the legs.  Noninvasive studies including ABI's and venous ultrasound of the legs will be obtained and the patient will follow up with me to review these studies.  The patient should  continue walking and begin a more formal exercise program. The patient should continue his antiplatelet therapy and aggressive treatment of the lipid abnormalities.  The patient should begin wearing graduated compression socks 15-20 mmHg strength to control  edema.    2. Diabetes mellitus without complication ([place]) Continue hypoglycemic medications as already ordered, these medications have been reviewed and there are no changes at this time.  Hgb A1C to be monitored as already arranged by primary service   3. Smoking Smoking cessation was discussed, 3-10 minutes spent on this topic specifically   4. Essential hypertension Continue antihypertensive medications as already ordered, these medications have been reviewed and there are no changes at this time.    Current Outpatient Medications on File Prior to Visit  Medication Sig Dispense Refill   empagliflozin (JARDIANCE) 10 MG TABS tablet Take 1 tablet (10 mg total) by mouth daily before breakfast. 30 tablet 2   ergocalciferol (DRISDOL) 1.25 MG (50000 UT) capsule Take 1 capsule (50,000 Units total) by mouth once a week. 4 capsule 5   fluticasone (FLONASE) 50 MCG/ACT nasal spray Place 2 sprays into both nostrils daily. 42 g 3   glucose blood (ACCU-CHEK GUIDE) test strip Check blood sugars BID and prn - E11.65 100 each 3   glyBURIDE-metformin (GLUCOVANCE) 5-500 MG tablet Take 1.5 tablets po BID or if blood sugar drops take 1 tablet po BID 270 tablet 1   Lancets Misc. (ACCU-CHEK FASTCLIX LANCET) KIT Accu-Chek Fastclix Lancet Drum     benazepril (LOTENSIN) 10 MG tablet Take 1 tablet (10 mg total) by mouth daily. (Patient not taking: Reported on 08/23/2021) 90 tablet 3   No current facility-administered medications on file prior to visit.    There are no Patient Instructions on file for this visit. No follow-ups on file.   Kris Hartmann, NP

## 2021-08-30 ENCOUNTER — Telehealth: Payer: Self-pay

## 2021-08-30 NOTE — Telephone Encounter (Signed)
Call[facility] to [facility] to follow up on referral. She stated they have not been calling patients to schedule since before covid, but patient can call them to schedule. I spoke with patient, gave her tele # [phone] to call and schedule her appointment-Toni

## 2021-09-03 ENCOUNTER — Other Ambulatory Visit (INDEPENDENT_AMBULATORY_CARE_PROVIDER_SITE_OTHER): Payer: Self-pay | Admitting: Nurse Practitioner

## 2021-09-03 DIAGNOSIS — R252 Cramp and spasm: Secondary | ICD-10-CM

## 2021-09-03 NOTE — Telephone Encounter (Signed)
Patient called stating she wanted a local gastroenterologist. I explained to her she will have to go to [facility] due to her BCBS. She then called back stating when she called to make appointment, they told her they did not have referral. I called [facility] gastroenterology. Receptionist verified they do have referral and instructed patient to call to schedule. I notified patient. Gave her telephone # [phone] to schedule appointment-Toni

## 2021-09-06 ENCOUNTER — Other Ambulatory Visit: Payer: Self-pay

## 2021-09-06 ENCOUNTER — Ambulatory Visit (INDEPENDENT_AMBULATORY_CARE_PROVIDER_SITE_OTHER): Payer: BLUE CROSS/BLUE SHIELD | Admitting: Nurse Practitioner

## 2021-09-06 ENCOUNTER — Ambulatory Visit (INDEPENDENT_AMBULATORY_CARE_PROVIDER_SITE_OTHER): Payer: BLUE CROSS/BLUE SHIELD

## 2021-09-06 VITALS — BP 138/87 | HR 66 | Ht 66.0 in | Wt 139.0 lb

## 2021-09-06 DIAGNOSIS — E1165 Type 2 diabetes mellitus with hyperglycemia: Secondary | ICD-10-CM

## 2021-09-06 DIAGNOSIS — R252 Cramp and spasm: Secondary | ICD-10-CM | POA: Diagnosis not present

## 2021-09-06 DIAGNOSIS — I10 Essential (primary) hypertension: Secondary | ICD-10-CM

## 2021-09-06 DIAGNOSIS — I8311 Varicose veins of right lower extremity with inflammation: Secondary | ICD-10-CM | POA: Diagnosis not present

## 2021-09-06 DIAGNOSIS — M7989 Other specified soft tissue disorders: Secondary | ICD-10-CM

## 2021-09-06 DIAGNOSIS — I8312 Varicose veins of left lower extremity with inflammation: Secondary | ICD-10-CM

## 2021-09-06 DIAGNOSIS — IMO0001 Reserved for inherently not codable concepts without codable children: Secondary | ICD-10-CM

## 2021-09-06 DIAGNOSIS — F17200 Nicotine dependence, unspecified, uncomplicated: Secondary | ICD-10-CM

## 2021-09-11 NOTE — Telephone Encounter (Signed)
Colonoscopy scheduled 11/08/21-tw

## 2021-09-15 ENCOUNTER — Encounter (INDEPENDENT_AMBULATORY_CARE_PROVIDER_SITE_OTHER): Payer: Self-pay | Admitting: Nurse Practitioner

## 2021-09-15 NOTE — Progress Notes (Signed)
Subjective:    Patient ID: Regina Short, female    DOB: 02-Apr-1961, 60 y.o.   MRN: [id number] Chief Complaint  Patient presents with   Follow-up    Pt conv BLE Ven reflux [place]    Regina Short is a 60 year old female that returns today for painful lower extremities and diminished pulses. Patient notes the pain is always associated with activity and is very consistent day today. Typically, the pain occurs at less than one block, progress is as activity continues to the point that the patient must stop walking. Resting including standing still for several minutes allowed resumption of the activity and the ability to walk a similar distance before stopping again. Uneven terrain and inclined shorten the distance. The pain has been progressive over the past several years. The patient states the inability to walk is now having a profound negative impact on quality of life and daily activities.  The patient also has severe cramping during the evening.   The patient denies rest pain or dangling of an extremity off the side of the bed during the night for relief. No open wounds or sores at this time. No prior interventions or surgeries.   A history of back problems or DJD of the lumbar sacral spine.       The patient's blood pressure has been stable and relatively well controlled. The patient denies amaurosis fugax or recent TIA symptoms. There are no recent neurological changes noted. The patient denies history of DVT, PE or superficial thrombophlebitis. The patient denies recent episodes of angina or shortness of breath.     Today noninvasive studies show an ABI 1.25 on the right 1.30 on the left.  She has a TBI of 1.13 on the right and 0.7 on the left.  The patient has strong triphasic tibial artery waveforms bilaterally with good toe waveforms bilaterally.  Patient has no evidence of DVT or superficial thrombophlebitis bilaterally.  No evidence of deep venous insufficiency bilaterally no evidence  of reflux in the great saphenous vein or short saphenous vein bilaterally.   Review of Systems  Cardiovascular:  Positive for leg swelling.  Musculoskeletal:  Positive for back pain.  All other systems reviewed and are negative.     Objective:   Physical Exam Vitals reviewed.  HENT:     Head: Normocephalic.  Cardiovascular:     Rate and Rhythm: Normal rate.     Comments: Nonpalpable pulses bilaterally Pulmonary:     Effort: Pulmonary effort is normal.  Skin:    General: Skin is warm and dry.  Neurological:     Mental Status: She is alert and oriented to person, place, and time.  Psychiatric:        Mood and Affect: Mood normal.        Behavior: Behavior normal.        Thought Content: Thought content normal.        Judgment: Judgment normal.    BP 138/87   Pulse 66   Ht '5\' 6"'  (1.676 m)   Wt 139 lb (63 kg)   BMI 22.44 kg/m   Past Medical History:  Diagnosis Date   Chronic back pain    Diabetes mellitus without complication (HCC)    GERD (gastroesophageal reflux disease)    Hand tendonitis    Hypertension     Social History   Socioeconomic History   Marital status: Single    Spouse name: Not on file   Number of children: Not on file  Years of education: Not on file   Highest education level: Not on file  Occupational History   Not on file  Tobacco Use   Smoking status: Some Days    Packs/day: 0.50    Types: Cigarettes    Last attempt to quit: 03/08/2021    Years since quitting: 0.5   Smokeless tobacco: Never   Tobacco comments:    pack of cigarettes last two days  Substance and Sexual Activity   Alcohol use: Yes    Alcohol/week: 0.0 standard drinks    Comment: holidays   Drug use: No   Sexual activity: Yes    Birth control/protection: None  Other Topics Concern   Not on file  Social History Narrative   Not on file   Social Determinants of Health   Financial Resource Strain: Not on file  Food Insecurity: Not on file  Transportation Needs:  Not on file  Physical Activity: Not on file  Stress: Not on file  Social Connections: Not on file  Intimate Partner Violence: Not on file    Past Surgical History:  Procedure Laterality Date   ABDOMINAL HYSTERECTOMY     ABDOMINAL SURGERY     5 tumors removed   BREAST BIOPSY Right 02/02/15 core   focal fibroadenomatoid changes and sclerosis    Family History  Problem Relation Age of Onset   Congestive Heart Failure Other    Breast cancer Maternal Aunt 60    Allergies  Allergen Reactions   Micardis [Telmisartan]     rash   Ciprofloxacin Rash   Tramadol Rash    CBC Latest Ref Rng & Units 08/09/2021 08/04/2021 06/22/2020  WBC 3.4 - 10.8 x10E3/uL 5.9 7.5 6.7  Hemoglobin 11.1 - 15.9 g/dL 13.1 12.3 13.4  Hematocrit 34.0 - 46.6 % 40.5 37.6 40.7  Platelets 150 - 450 x10E3/uL 252 242 265      CMP     Component Value Date/Time   NA 139 08/09/2021 0928   NA 137 01/03/2013 2300   K 4.6 08/09/2021 0928   K 3.8 01/03/2013 2300   CL 100 08/09/2021 0928   CL 107 01/03/2013 2300   CO2 23 08/09/2021 0928   CO2 21 01/03/2013 2300   GLUCOSE 184 (H) 08/09/2021 0928   GLUCOSE 324 (H) 08/04/2021 1920   GLUCOSE 273 (H) 01/03/2013 2300   BUN 13 08/09/2021 0928   BUN 12 01/03/2013 2300   CREATININE 0.60 08/09/2021 0928   CREATININE 0.57 (L) 01/03/2013 2300   CALCIUM 9.9 08/09/2021 0928   CALCIUM 8.2 (L) 01/03/2013 2300   PROT 6.6 08/09/2021 0928   PROT 8.2 01/03/2013 1608   ALBUMIN 4.7 08/09/2021 0928   ALBUMIN 4.5 01/03/2013 1608   AST 16 08/09/2021 0928   AST 16 01/03/2013 1608   ALT 12 08/09/2021 0928   ALT 27 01/03/2013 1608   ALKPHOS 115 08/09/2021 0928   ALKPHOS 194 (H) 01/03/2013 1608   BILITOT 0.2 08/09/2021 0928   BILITOT 0.4 01/03/2013 1608   GFRNONAA >60 08/04/2021 1920   GFRNONAA >60 01/03/2013 2300   GFRAA 117 06/22/2020 0932   GFRAA >60 01/03/2013 2300     VAS [place] ABI WITH/WO TBI  Result Date: 09/10/2021  LOWER EXTREMITY DOPPLER STUDY Patient Name:  Regina Short  Date of Exam:   09/06/2021 Medical Rec #: [id number]      Accession #:    [id number] Date of Birth: December 21, 1961       Patient Gender: F Patient Age:  60 years Exam Location:  [facility] Procedure:      VAS [place] ABI WITH/WO TBI Referring Phys: Eulogio Ditch --------------------------------------------------------------------------------  Indications: Rest pain.  Performing Technologist: Almira Coaster RVS  Examination Guidelines: A complete evaluation includes at minimum, Doppler waveform signals and systolic blood pressure reading at the level of bilateral brachial, anterior tibial, and posterior tibial arteries, when vessel segments are accessible. Bilateral testing is considered an integral part of a complete examination. Photoelectric Plethysmograph (PPG) waveforms and toe systolic pressure readings are included as required and additional duplex testing as needed. Limited examinations for reoccurring indications may be performed as noted.  ABI Findings: +---------+------------------+-----+--------+--------+ Right    Rt Pressure (mmHg)IndexWaveformComment  +---------+------------------+-----+--------+--------+ Brachial 135                                     +---------+------------------+-----+--------+--------+ ATA      166                            1.23     +---------+------------------+-----+--------+--------+ PTA      173               1.28                  +---------+------------------+-----+--------+--------+ Great Toe152               1.13 Normal           +---------+------------------+-----+--------+--------+ +---------+------------------+-----+---------+-------+ Left     Lt Pressure (mmHg)IndexWaveform Comment +---------+------------------+-----+---------+-------+ Brachial 133                                     +---------+------------------+-----+---------+-------+ ATA      156                    triphasic1.16     +---------+------------------+-----+---------+-------+ PTA      175               1.30 triphasic        +---------+------------------+-----+---------+-------+ [place]               0.87 Normal           +---------+------------------+-----+---------+-------+ +-------+-----------+-----------+------------+------------+ ABI/TBIToday's ABIToday's TBIPrevious ABIPrevious TBI +-------+-----------+-----------+------------+------------+ Right  1.28       1.13                                +-------+-----------+-----------+------------+------------+ Left   1.30       .87                                 +-------+-----------+-----------+------------+------------+  Summary: Right: Resting right ankle-brachial index is within normal range. No evidence of significant right lower extremity arterial disease. The right toe-brachial index is normal. Left: Resting left ankle-brachial index is within normal range. No evidence of significant left lower extremity arterial disease. The left toe-brachial index is normal.  *See table(s) above for measurements and observations.  Electronically signed by Leotis Pain MD on 09/10/2021 at 12:54:09 PM.    Final        Assessment & Plan:   1. Varicose veins of both lower extremities with inflammation  Patient does have some notable spider varicosities however there is no evidence of venous reflux.  Without evidence of venous reflux these are typically considered superficial and cosmetic.  Patient is advised to utilize medical grade compression stockings elevation and activity to help with prevention of worsening varicosities.  2. Essential hypertension Continue antihypertensive medications as already ordered, these medications have been reviewed and there are no changes at this time.   3. Type 2 diabetes mellitus with hyperglycemia, without long-term current use of insulin (HCC) Continue hypoglycemic medications as already ordered, these medications have  been reviewed and there are no changes at this time.  Hgb A1C to be monitored as already arranged by primary service   4. Leg cramps Recommend:  I do not find evidence of Vascular pathology that would explain the patient's symptoms  The patient has atypical pain symptoms for vascular disease  I do not find evidence of Vascular pathology that would explain the patient's symptoms and I suspect the patient is c/o pseudoclaudication.  Patient should have an evaluation of his LS spine which I referred to neurosurgery for further work-up and evaluation  Noninvasive studies including venous ultrasound of the legs do not identify vascular problems  The patient should continue walking and begin a more formal exercise program. The patient should continue his antiplatelet therapy and aggressive treatment of the lipid abnormalities. The patient should begin wearing graduated compression socks 15-20 mmHg strength to control her mild edema.  Patient will follow-up with me on a PRN basis    5. Smoking Smoking cessation was discussed, 3-10 minutes spent on this topic specifically    Current Outpatient Medications on File Prior to Visit  Medication Sig Dispense Refill   benazepril (LOTENSIN) 10 MG tablet Take 1 tablet (10 mg total) by mouth daily. 90 tablet 3   empagliflozin (JARDIANCE) 10 MG TABS tablet Take 1 tablet (10 mg total) by mouth daily before breakfast. 30 tablet 2   ergocalciferol (DRISDOL) 1.25 MG (50000 UT) capsule Take 1 capsule (50,000 Units total) by mouth once a week. 4 capsule 5   fluticasone (FLONASE) 50 MCG/ACT nasal spray Place 2 sprays into both nostrils daily. 42 g 3   glucose blood (ACCU-CHEK GUIDE) test strip Check blood sugars BID and prn - E11.65 100 each 3   glyBURIDE-metformin (GLUCOVANCE) 5-500 MG tablet Take 1.5 tablets po BID or if blood sugar drops take 1 tablet po BID 270 tablet 1   Lancets Misc. (ACCU-CHEK FASTCLIX LANCET) KIT Accu-Chek Fastclix Lancet Drum      No current facility-administered medications on file prior to visit.    There are no Patient Instructions on file for this visit. No follow-ups on file.   Kris Hartmann, NP

## 2021-10-02 ENCOUNTER — Emergency Department: Payer: BLUE CROSS/BLUE SHIELD

## 2021-10-02 ENCOUNTER — Inpatient Hospital Stay
Admission: EM | Admit: 2021-10-02 | Discharge: 2021-10-08 | DRG: 103 | Disposition: A | Discharge status: Home / Self Care | Payer: BLUE CROSS/BLUE SHIELD | Attending: Internal Medicine | Admitting: Internal Medicine

## 2021-10-02 ENCOUNTER — Other Ambulatory Visit: Payer: Self-pay

## 2021-10-02 ENCOUNTER — Encounter: Payer: Self-pay | Admitting: Emergency Medicine

## 2021-10-02 DIAGNOSIS — K219 Gastro-esophageal reflux disease without esophagitis: Secondary | ICD-10-CM | POA: Diagnosis present

## 2021-10-02 DIAGNOSIS — H539 Unspecified visual disturbance: Secondary | ICD-10-CM | POA: Diagnosis not present

## 2021-10-02 DIAGNOSIS — H53133 Sudden visual loss, bilateral: Secondary | ICD-10-CM

## 2021-10-02 DIAGNOSIS — Z20822 Contact with and (suspected) exposure to covid-19: Secondary | ICD-10-CM | POA: Diagnosis present

## 2021-10-02 DIAGNOSIS — G43101 Migraine with aura, not intractable, with status migrainosus: Secondary | ICD-10-CM | POA: Diagnosis present

## 2021-10-02 DIAGNOSIS — T380X5A Adverse effect of glucocorticoids and synthetic analogues, initial encounter: Secondary | ICD-10-CM | POA: Diagnosis not present

## 2021-10-02 DIAGNOSIS — G8929 Other chronic pain: Secondary | ICD-10-CM | POA: Diagnosis present

## 2021-10-02 DIAGNOSIS — Z79899 Other long term (current) drug therapy: Secondary | ICD-10-CM | POA: Diagnosis not present

## 2021-10-02 DIAGNOSIS — G43901 Migraine, unspecified, not intractable, with status migrainosus: Secondary | ICD-10-CM | POA: Diagnosis not present

## 2021-10-02 DIAGNOSIS — F17210 Nicotine dependence, cigarettes, uncomplicated: Secondary | ICD-10-CM | POA: Diagnosis present

## 2021-10-02 DIAGNOSIS — Z7984 Long term (current) use of oral hypoglycemic drugs: Secondary | ICD-10-CM

## 2021-10-02 DIAGNOSIS — H543 Unqualified visual loss, both eyes: Secondary | ICD-10-CM | POA: Diagnosis present

## 2021-10-02 DIAGNOSIS — M549 Dorsalgia, unspecified: Secondary | ICD-10-CM | POA: Diagnosis present

## 2021-10-02 DIAGNOSIS — Z8249 Family history of ischemic heart disease and other diseases of the circulatory system: Secondary | ICD-10-CM

## 2021-10-02 DIAGNOSIS — Z803 Family history of malignant neoplasm of breast: Secondary | ICD-10-CM

## 2021-10-02 DIAGNOSIS — Z888 Allergy status to other drugs, medicaments and biological substances status: Secondary | ICD-10-CM

## 2021-10-02 DIAGNOSIS — E1165 Type 2 diabetes mellitus with hyperglycemia: Secondary | ICD-10-CM | POA: Diagnosis present

## 2021-10-02 DIAGNOSIS — H469 Unspecified optic neuritis: Secondary | ICD-10-CM

## 2021-10-02 DIAGNOSIS — I10 Essential (primary) hypertension: Secondary | ICD-10-CM | POA: Diagnosis present

## 2021-10-02 LAB — COMPREHENSIVE METABOLIC PANEL
ALT: 13 U/L (ref 0–44)
AST: 13 U/L — ABNORMAL LOW (ref 15–41)
Albumin: 3.9 g/dL (ref 3.5–5.0)
Alkaline Phosphatase: 80 U/L (ref 38–126)
Anion gap: 12 (ref 5–15)
BUN: 16 mg/dL (ref 6–20)
CO2: 25 mmol/L (ref 22–32)
Calcium: 9.9 mg/dL (ref 8.9–10.3)
Chloride: 99 mmol/L (ref 98–111)
Creatinine, Ser: 0.6 mg/dL (ref 0.44–1.00)
GFR, Estimated: >60 mL/min (ref >60)
Glucose, Bld: 218 mg/dL — ABNORMAL HIGH (ref 70–99)
Potassium: 4.5 mmol/L (ref 3.5–5.1)
Sodium: 136 mmol/L (ref 135–145)
Total Bilirubin: 0.4 mg/dL (ref 0.3–1.2)
Total Protein: 7.2 g/dL (ref 6.5–8.1)

## 2021-10-02 LAB — DIFFERENTIAL
Abs Immature Granulocytes: 0.04 10*3/uL (ref 0.00–0.07)
Basophils Absolute: 0.1 10*3/uL (ref 0.0–0.1)
Basophils Relative: 1 %
Eosinophils Absolute: 0.1 10*3/uL (ref 0.0–0.5)
Eosinophils Relative: 1 %
Immature Granulocytes: 1 %
Lymphocytes Relative: 27 %
Lymphs Abs: 2.3 10*3/uL (ref 0.7–4.0)
Monocytes Absolute: 0.3 10*3/uL (ref 0.1–1.0)
Monocytes Relative: 4 %
Neutro Abs: 5.8 10*3/uL (ref 1.7–7.7)
Neutrophils Relative %: 66 %

## 2021-10-02 LAB — APTT: aPTT: 27 seconds (ref 24–36)

## 2021-10-02 LAB — CBC
HCT: 39.9 % (ref 36.0–46.0)
Hemoglobin: 13.5 g/dL (ref 12.0–15.0)
MCH: 27.2 pg (ref 26.0–34.0)
MCHC: 33.8 g/dL (ref 30.0–36.0)
MCV: 80.4 fL (ref 80.0–100.0)
Platelets: 248 10*3/uL (ref 150–400)
RBC: 4.96 MIL/uL (ref 3.87–5.11)
RDW: 16.2 % — ABNORMAL HIGH (ref 11.5–15.5)
WBC: 8.6 10*3/uL (ref 4.0–10.5)
nRBC: 0 % (ref 0.0–0.2)

## 2021-10-02 LAB — RESP PANEL BY RT-PCR (FLU A&B, COVID) ARPGX2
Influenza A by PCR: NEGATIVE
Influenza B by PCR: NEGATIVE
SARS Coronavirus 2 by RT PCR: NEGATIVE

## 2021-10-02 LAB — PROTIME-INR
INR: 0.9 (ref 0.8–1.2)
Prothrombin Time: 12.4 seconds (ref 11.4–15.2)

## 2021-10-02 LAB — SEDIMENTATION RATE: Sed Rate: 5 mm/hr (ref 0–30)

## 2021-10-02 MED ORDER — DIVALPROEX SODIUM 500 MG PO DR TAB
1000.0000 mg | DELAYED_RELEASE_TABLET | Freq: Once | ORAL | Status: AC
  Administered 2021-10-02: 1000 mg via ORAL
  Filled 2021-10-02: qty 2

## 2021-10-02 MED ORDER — ONDANSETRON HCL 4 MG PO TABS: 4.0000 mg | ORAL_TABLET | Freq: Four times a day (QID) | ORAL | Status: DC | PRN

## 2021-10-02 MED ORDER — ONDANSETRON HCL 4 MG/2ML IJ SOLN: 4.0000 mg | Freq: Four times a day (QID) | INTRAMUSCULAR | Status: DC | PRN

## 2021-10-02 MED ORDER — ACETAMINOPHEN 500 MG PO TABS
1000.0000 mg | ORAL_TABLET | Freq: Once | ORAL | Status: AC
  Administered 2021-10-02: 1000 mg via ORAL
  Filled 2021-10-02: qty 2

## 2021-10-02 MED ORDER — DIVALPROEX SODIUM 500 MG PO DR TAB
500.0000 mg | DELAYED_RELEASE_TABLET | Freq: Two times a day (BID) | ORAL | Status: DC
  Administered 2021-10-03: 500 mg via ORAL
  Filled 2021-10-02: qty 1

## 2021-10-02 MED ORDER — GADOBUTROL 1 MMOL/ML IV SOLN
6.0000 mL | Freq: Once | INTRAVENOUS | Status: AC | PRN
  Administered 2021-10-02: 6 mL via INTRAVENOUS
  Filled 2021-10-02: qty 6

## 2021-10-02 MED ORDER — ACETAMINOPHEN 325 MG PO TABS
650.0000 mg | ORAL_TABLET | Freq: Four times a day (QID) | ORAL | Status: DC | PRN
  Administered 2021-10-03 – 2021-10-05 (×6): 650 mg via ORAL
  Filled 2021-10-02 (×6): qty 2

## 2021-10-02 MED ORDER — DIPHENHYDRAMINE HCL 50 MG/ML IJ SOLN
50.0000 mg | Freq: Once | INTRAMUSCULAR | Status: AC
  Administered 2021-10-02: 50 mg via INTRAVENOUS
  Filled 2021-10-02: qty 1

## 2021-10-02 MED ORDER — DIVALPROEX SODIUM 500 MG PO DR TAB: 500.0000 mg | DELAYED_RELEASE_TABLET | Freq: Three times a day (TID) | ORAL | Status: DC

## 2021-10-02 MED ORDER — ACETAMINOPHEN 325 MG PO TABS
650.0000 mg | ORAL_TABLET | Freq: Once | ORAL | Status: AC
  Administered 2021-10-02: 650 mg via ORAL
  Filled 2021-10-02: qty 2

## 2021-10-02 MED ORDER — ACETAMINOPHEN 650 MG RE SUPP
650.0000 mg | Freq: Four times a day (QID) | RECTAL | Status: DC | PRN
  Filled 2021-10-02: qty 1

## 2021-10-02 MED ORDER — PROCHLORPERAZINE EDISYLATE 10 MG/2ML IJ SOLN: 10.0000 mg | Freq: Four times a day (QID) | INTRAMUSCULAR | Status: DC | PRN

## 2021-10-02 MED ORDER — PROCHLORPERAZINE EDISYLATE 10 MG/2ML IJ SOLN
10.0000 mg | Freq: Four times a day (QID) | INTRAMUSCULAR | Status: DC
  Administered 2021-10-02: 10 mg via INTRAVENOUS
  Filled 2021-10-02: qty 2

## 2021-10-02 MED ORDER — PROCHLORPERAZINE EDISYLATE 10 MG/2ML IJ SOLN
10.0000 mg | INTRAMUSCULAR | Status: AC
  Administered 2021-10-02: 10 mg via INTRAVENOUS
  Filled 2021-10-02: qty 2

## 2021-10-02 MED ORDER — MAGNESIUM SULFATE 2 GM/50ML IV SOLN
2.0000 g | INTRAVENOUS | Status: AC
  Administered 2021-10-02: 2 g via INTRAVENOUS
  Filled 2021-10-02: qty 50

## 2021-10-02 NOTE — Progress Notes (Signed)
I have discussed with Dr. Joni Fears. Some questionable improvement with compazine/benadryl/magnesium which would suggest status migrainosus as opposed to optic neuritis. With normal ESR, I think temporal arteritis extremely unlikely. Rather than jumping to high dose(e.g. 1 g daily) steroids, I would favor giving scheduled compazine and depakote and reassessing for effect.   1) Compazine 41m q6h 2) oral depakote 1.5 g x 1 3) if not at baseline tomorrow, LP and start high dose steroids.   MRoland Rack MD [facility] [phone] If 7pm- 7am, please page neurology on call as listed in [place]

## 2021-10-02 NOTE — ED Notes (Signed)
Pt assisted to get her upper earring out. Earring placed in a clear ziplock for pt and given to her visitor

## 2021-10-02 NOTE — Consult Note (Signed)
Neurology Consultation Reason for Consult: Bilateral vision loss Referring Physician: Joni Fears, Mamie Nick  CC: Vision Loss  History is obtained from:Patient  HPI: Regina Short is a 60 y.o. female who presents with 3 weeks of vision loss.  She states that she initially noticed that her left eye was blurred, and this progressed over the course of 24 hours.  It has remained blurry and was painful when it first started, but the pain is gradually eased off over the course the past few weeks.  She saw an eye doctor about it yesterday, but when she woke up this morning, she noticed that her right eye was blurry as well and it has been getting progressively worse, and it is now painful as well.  She had already been referred and had an appointment today with Dr. Edison Pace who evaluated her and found no significant abnormalities on his ophthalmological exam.  Intraocular pressures were normal and visual acuity checked OD 20/80 OS 20/80.  She did not have a significant APD today by technician exam, but had already been dilated by the time that Dr. Edison Pace saw her.  I am not certain about yesterday when she did not have bilateral symptoms.   ROS: A 14 point ROS was performed and is negative except as noted in the HPI.   Past Medical History:  Diagnosis Date   Chronic back pain    Diabetes mellitus without complication (HCC)    GERD (gastroesophageal reflux disease)    Hand tendonitis    Hypertension      Family History  Problem Relation Age of Onset   Congestive Heart Failure Other    Breast cancer Maternal Aunt 60     Social History:  reports that she has been smoking cigarettes. She has been smoking an average of .5 packs per day. She has never used smokeless tobacco. She reports current alcohol use. She reports that she does not use drugs.   Exam: Current vital signs: BP 122/77   Pulse 73   Temp 98 F (36.7 C) (Oral)   Resp 18   Ht _0  (1.676 m)   Wt 63 kg   SpO2 98%   BMI 22.42 kg/m  Vital  signs in last 24 hours: Temp:  [98 F (36.7 C)] 98 F (36.7 C) (10/12 1241) Pulse Rate:  [73] 73 (10/12 1241) Resp:  [18] 18 (10/12 1241) BP: (122)/(77) 122/77 (10/12 1241) SpO2:  [98 %] 98 % (10/12 1241) Weight:  [63 kg] 63 kg (10/12 1226)   Physical Exam  Constitutional: Appears well-developed and well-nourished.  Psych: Affect appropriate to situation Eyes: No scleral injection HENT: No OP obstruction MSK: no joint deformities.  Cardiovascular: Normal rate and regular rhythm.  Respiratory: Effort normal, non-labored breathing GI: Soft.  No distension. There is no tenderness.  Skin: WDI  Neuro: Mental Status: Patient is awake, alert, oriented to person, place, month, year, and situation. Patient is able to give a clear and coherent history. No signs of aphasia or neglect Cranial Nerves: II: She is extremely light sensitive, I think due to her dilated eyes, and is limited in her cooperation, but she is able to finger count bilaterally at the very least.. Pupils are dilated bilaterally (pharmacologically) III,IV, VI: EOMI without ptosis or diploplia.  V: Facial sensation is symmetric to temperature VII: Facial movement is symmetric.  VIII: hearing is intact to voice X: Uvula elevates symmetrically XI: Shoulder shrug is symmetric. XII: tongue is midline without atrophy or fasciculations.  Motor: Tone  is normal. Bulk is normal. 5/5 strength was present in all four extremities.  Sensory: Sensation is symmetric to light touch and temperature in the arms and legs. Deep Tendon Reflexes: 32+ and symmetric in the patella Cerebellar: No clear ataxia, but limited due to vision     I have reviewed labs in epic and the results pertinent to this consultation are: ESR and CRP are pending Mildly elevated glucose CBC is normal  I have reviewed the images obtained: CT head is negative  Impression: 60 year old female with bilateral, asynchronous painful vision loss.  With an  ophthalmologic examination today revealing no abnormalities, the differential is fairly limited.  I would strongly suspect optic neuritis in the setting, and bilateral optic neuritis is fairly often associated with neuromyelitis optica.  Posterior ischemic optic neuropathy(e.g. temporal arteritis) could be considered, anterior ischemic optic neuropathy would be expected to have disc changes.  An MRI brain and orbits with and without contrast could help to narrow the differential and evaluate for other etiologies.  Recommendations: 1) MRI brain and orbits with and without contrast 2) further recommendations following MRI.  Roland Rack, MD [facility] [phone]  If 7pm- 7am, please page neurology on call as listed in [place].

## 2021-10-02 NOTE — ED Provider Notes (Signed)
[facility] Emergency Department Provider Note  ____________________________________________  Time seen: Approximately 3:41 PM  I have reviewed the triage vital signs and the nursing notes.   HISTORY  Chief Complaint Loss of Vision    HPI Regina Short is a 60 y.o. female with a history of hypertension diabetes and GERD who comes ED complaining of vision loss and headache.  Patient reports that she has had gradual progressive vision loss of the left eye over the last 3 weeks.  Since yesterday she also started having blurriness of her right eye vision along with headache behind the eyes, worse on the right.  She went to ophthalmology Dr. Melony Overly office today who found her relatively unremarkable eye examination including normal intraocular pressures and since the patient to the ED for neurologic work-up.  Patient denies any trauma, no paresthesias or motor weakness.    Past Medical History:  Diagnosis Date   Chronic back pain    Diabetes mellitus without complication (HCC)    GERD (gastroesophageal reflux disease)    Hand tendonitis    Hypertension      Patient Active Problem List   Diagnosis Date Noted   Vision disturbance 36/46/8032   Complicated migraine with status migrainosus 10/02/2021   Smoking 01/04/2021   Acute upper respiratory infection 11/25/2020   Pain in both wrists 11/25/2020   Varicose veins of both lower extremities with inflammation 10/14/2020   Routine cervical smear 10/14/2020   Encounter for screening mammogram for malignant neoplasm of breast 10/14/2020   Flu vaccine need 10/14/2020   Leg cramps 01/29/2020   Pain in both feet 11/09/2019   Allergic rhinitis due to pollen 05/21/2019   Contact dermatitis and eczema due to detergents 05/18/2019   Left knee pain 02/18/2019   Vitamin D deficiency 11/12/2018   Osteoarthritis of knee 07/16/2018   Ingrown toenail without infection 07/09/2018   Dysuria 07/09/2018   Uncontrolled  type 2 diabetes mellitus with hyperglycemia ([place]) 04/02/2018   Essential hypertension 04/02/2018   Type 2 diabetes mellitus with hyperglycemia, without long-term current use of insulin ([place]) 04/02/2018   Diabetes mellitus without complication ([place]) 12/14/8249   Encounter for general adult medical examination with abnormal findings 04/02/2018   Acquired trigger finger 11/07/2016   Pes anserinus bursitis 08/03/2015   SBO (small bowel obstruction) ([place])    Obstruction of intestine ([place]) 07/25/2015   Impingement syndrome of left shoulder 02/23/2015   Left supraspinatus tenosynovitis 02/23/2015     Past Surgical History:  Procedure Laterality Date   ABDOMINAL HYSTERECTOMY     ABDOMINAL SURGERY     5 tumors removed   BREAST BIOPSY Right 02/02/15 core   focal fibroadenomatoid changes and sclerosis     Prior to Admission medications   Medication Sig Start Date End Date Taking? Authorizing Provider  benazepril (LOTENSIN) 10 MG tablet Take 1 tablet (10 mg total) by mouth daily. 01/04/21   Lavera Guise, MD  empagliflozin (JARDIANCE) 10 MG TABS tablet Take 1 tablet (10 mg total) by mouth daily before breakfast. 08/09/21   McDonough, Si Gaul, PA-C  ergocalciferol (DRISDOL) 1.25 MG (50000 UT) capsule Take 1 capsule (50,000 Units total) by mouth once a week. 04/19/20   Ronnell Freshwater, NP  fluticasone (FLONASE) 50 MCG/ACT nasal spray Place 2 sprays into both nostrils daily. 05/20/19   Ronnell Freshwater, NP  glucose blood (ACCU-CHEK GUIDE) test strip Check blood sugars BID and prn - E11.65 08/09/21   McDonough, Si Gaul, PA-C  glyBURIDE-metformin (GLUCOVANCE)  5-500 MG tablet Take 1.5 tablets po BID or if blood sugar drops take 1 tablet po BID 01/25/21   Lavera Guise, MD  Lancets Misc. (ACCU-CHEK FASTCLIX LANCET) KIT Accu-Chek Fastclix Lancet Drum    [provider]     Allergies Micardis [telmisartan], Ciprofloxacin, and Tramadol   Family History  Problem Relation Age of Onset    Congestive Heart Failure Other    Breast cancer Maternal Aunt 60    Social History Social History   Tobacco Use   Smoking status: Some Days    Packs/day: 0.50    Types: Cigarettes    Last attempt to quit: 03/08/2021    Years since quitting: 0.5   Smokeless tobacco: Never   Tobacco comments:    pack of cigarettes last two days  Substance Use Topics   Alcohol use: Yes    Alcohol/week: 0.0 standard drinks    Comment: holidays   Drug use: No    Review of Systems  Constitutional:   No fever or chills.  ENT:   No sore throat. No rhinorrhea. Cardiovascular:   No chest pain or syncope. Respiratory:   No dyspnea or cough. Gastrointestinal:   Negative for abdominal pain, vomiting and diarrhea.  Musculoskeletal:   Negative for focal pain or swelling All other systems reviewed and are negative except as documented above in ROS and HPI.  ____________________________________________   PHYSICAL EXAM:  VITAL SIGNS: ED Triage Vitals  Enc Vitals Group     BP 10/02/21 1241 122/77     Pulse Rate 10/02/21 1241 73     Resp 10/02/21 1241 18     Temp 10/02/21 1241 98 F (36.7 C)     Temp Source 10/02/21 1241 Oral     SpO2 10/02/21 1241 98 %     Weight 10/02/21 1226 138 lb 14.2 oz (63 kg)     Height 10/02/21 1226 _0  (1.676 m)     Head Circumference --      Peak Flow --      Pain Score 10/02/21 1226 10     Pain Loc --      Pain Edu? --      Excl. in [place]? --     Vital signs reviewed, nursing assessments reviewed.   Constitutional:   Alert and oriented. Non-toxic appearance. Eyes:   Conjunctivae are normal. EOMI. pupils are pharmacologically dilated bilaterally ENT      Head:   Normocephalic and atraumatic.      Nose:   Normal      Mouth/Throat:   Normal, moist mucosa.      Neck:   No meningismus. Full ROM. Hematological/Lymphatic/Immunilogical:   No cervical lymphadenopathy. Cardiovascular:   RRR. Symmetric bilateral radial and DP pulses.  No murmurs. Cap refill less than 2  seconds. Respiratory:   Normal respiratory effort without tachypnea/retractions. Breath sounds are clear and equal bilaterally. No wheezes/rales/rhonchi. Gastrointestinal:   Soft and nontender. Non distended. There is no CVA tenderness.  No rebound, rigidity, or guarding. Genitourinary:   deferred Musculoskeletal:   Normal range of motion in all extremities. No joint effusions.  No lower extremity tenderness.  No edema. Neurologic:   Normal speech and language.  Motor grossly intact. No acute focal neurologic deficits are appreciated.  Skin:    Skin is warm, dry and intact. No rash noted.  No petechiae, purpura, or bullae.  ____________________________________________    LABS (pertinent positives/negatives) (all labs ordered are listed, but only abnormal results are displayed) Labs  Reviewed  CBC - Abnormal; Notable for the following components:      Result Value   RDW 16.2 (*)    All other components within normal limits  COMPREHENSIVE METABOLIC PANEL - Abnormal; Notable for the following components:   Glucose, Bld 218 (*)    AST 13 (*)    All other components within normal limits  RESP PANEL BY RT-PCR (FLU A&B, COVID) ARPGX2  PROTIME-INR  APTT  DIFFERENTIAL  SEDIMENTATION RATE  C-REACTIVE PROTEIN   ____________________________________________   EKG  Interpreted by me  Date: 10/02/2021  Rate: 68  Rhythm: normal sinus rhythm  QRS Axis: normal  Intervals: normal  ST/T Wave abnormalities: normal  Conduction Disutrbances: none  Narrative Interpretation: unremarkable     ____________________________________________    RADIOLOGY  CT HEAD WO CONTRAST  Result Date: 10/02/2021 CLINICAL DATA:  Stroke suspected. Vision loss and left eye. Neurologic deficit. EXAM: CT HEAD WITHOUT CONTRAST TECHNIQUE: Contiguous axial images were obtained from the base of the skull through the vertex without intravenous contrast. COMPARISON:  None. FINDINGS: Brain: No evidence of acute  infarction, hemorrhage, hydrocephalus, extra-axial collection or mass lesion/mass effect. Vascular: No hyperdense vessel or unexpected calcification. Skull: Normal. Negative for fracture or focal lesion. Sinuses/Orbits: No acute finding. Other: None. IMPRESSION: No acute intracranial abnormalities. Normal brain. Electronically Signed   By: Kerby Moors M.D.   On: 10/02/2021 13:23   MR Brain W and Wo Contrast  Result Date: 10/02/2021 CLINICAL DATA:  Three weeks of vision loss, concern for multiple sclerosis EXAM: MRI HEAD AND ORBITS WITHOUT AND WITH CONTRAST TECHNIQUE: Multiplanar, multiecho pulse sequences of the brain and surrounding structures were obtained without and with intravenous contrast. Multiplanar, multiecho pulse sequences of the orbits and surrounding structures were obtained including fat saturation techniques, before and after intravenous contrast administration. CONTRAST:  48m GADAVIST GADOBUTROL 1 MMOL/ML IV SOLN COMPARISON:  No prior MRI, correlation is made with CT head 10/02/2021 FINDINGS: MRI HEAD FINDINGS Brain: No acute infarction, hemorrhage, hydrocephalus, extra-axial collection or mass lesion. A few T2 hyperintense foci are noted in the bilateral periventricular white matter; no foci involving the corpus callosum, juxtacortical white matter, infratentorially, or in the imaged cervical spine. No abnormal enhancement. Prominence of the right greater than left cerebellar tonsils, which extend up to 4 mm the on the foramen magnum. Vascular: Normal flow voids. Skull and upper cervical spine: Normal marrow signal. Other: The mastoids are well aerated. MRI ORBITS FINDINGS Evaluation is somewhat limited by motion artifact. Orbits: No traumatic or inflammatory finding. Globes, optic nerves, orbital fat, extraocular muscles, vascular structures, and lacrimal glands are normal. No definite abnormal enhancement. Visualized sinuses: Mucosal thickening in the left maxillary sinus. Soft tissues:  Negative. IMPRESSION: 1. No acute intracranial process. Fall there are scattered T2 hyperintense foci in the bilateral periventricular white matter, these are not in a pattern suspicious for demyelinating disease and are favored to be the sequela of small vessel ischemic disease. 2. Evaluation the orbits is somewhat limited by motion artifact. Within this limitation, normal appearance of the orbits, without abnormal signal. Electronically Signed   By: AMerilyn BabaM.D.   On: 10/02/2021 17:30   MR ORBITS W WO CONTRAST  Result Date: 10/02/2021 CLINICAL DATA:  Three weeks of vision loss, concern for multiple sclerosis EXAM: MRI HEAD AND ORBITS WITHOUT AND WITH CONTRAST TECHNIQUE: Multiplanar, multiecho pulse sequences of the brain and surrounding structures were obtained without and with intravenous contrast. Multiplanar, multiecho pulse sequences of the orbits and surrounding  structures were obtained including fat saturation techniques, before and after intravenous contrast administration. CONTRAST:  85m GADAVIST GADOBUTROL 1 MMOL/ML IV SOLN COMPARISON:  No prior MRI, correlation is made with CT head 10/02/2021 FINDINGS: MRI HEAD FINDINGS Brain: No acute infarction, hemorrhage, hydrocephalus, extra-axial collection or mass lesion. A few T2 hyperintense foci are noted in the bilateral periventricular white matter; no foci involving the corpus callosum, juxtacortical white matter, infratentorially, or in the imaged cervical spine. No abnormal enhancement. Prominence of the right greater than left cerebellar tonsils, which extend up to 4 mm the on the foramen magnum. Vascular: Normal flow voids. Skull and upper cervical spine: Normal marrow signal. Other: The mastoids are well aerated. MRI ORBITS FINDINGS Evaluation is somewhat limited by motion artifact. Orbits: No traumatic or inflammatory finding. Globes, optic nerves, orbital fat, extraocular muscles, vascular structures, and lacrimal glands are normal. No  definite abnormal enhancement. Visualized sinuses: Mucosal thickening in the left maxillary sinus. Soft tissues: Negative. IMPRESSION: 1. No acute intracranial process. Fall there are scattered T2 hyperintense foci in the bilateral periventricular white matter, these are not in a pattern suspicious for demyelinating disease and are favored to be the sequela of small vessel ischemic disease. 2. Evaluation the orbits is somewhat limited by motion artifact. Within this limitation, normal appearance of the orbits, without abnormal signal. Electronically Signed   By: AMerilyn BabaM.D.   On: 10/02/2021 17:30    ____________________________________________   PROCEDURES Procedures  ____________________________________________  DIFFERENTIAL DIAGNOSIS   Ischemic stroke, intracranial mass, intracranial hemorrhage, optic neuritis, complicated migraine  CLINICAL IMPRESSION / ASSESSMENT AND PLAN / ED COURSE  Medications ordered in the ED: Medications  prochlorperazine (COMPAZINE) injection 10 mg (has no administration in time range)  acetaminophen (TYLENOL) tablet 650 mg (650 mg Oral Given 10/02/21 1311)  acetaminophen (TYLENOL) tablet 1,000 mg (1,000 mg Oral Given 10/02/21 1554)  gadobutrol (GADAVIST) 1 MMOL/ML injection 6 mL (6 mLs Intravenous Contrast Given 10/02/21 1701)  prochlorperazine (COMPAZINE) injection 10 mg (10 mg Intravenous Given 10/02/21 1809)  diphenhydrAMINE (BENADRYL) injection 50 mg (50 mg Intravenous Given 10/02/21 1809)  magnesium sulfate IVPB 2 g 50 mL (0 g Intravenous Stopped 10/02/21 1909)  divalproex (DEPAKOTE) DR tablet 1,000 mg (1,000 mg Oral Given 10/02/21 2000)    Pertinent labs & imaging results that were available during my care of the patient were reviewed by me and considered in my medical decision making (see chart for details).  Regina MCMANIGALwas evaluated in Emergency Department on 10/02/2021 for the symptoms described in the history of present illness. She was  evaluated in the context of the global COVID-19 pandemic, which necessitated consideration that the patient might be at risk for infection with the SARS-CoV-2 virus that causes COVID-19. Institutional protocols and algorithms that pertain to the evaluation of patients at risk for COVID-19 are in a state of rapid change based on information released by regulatory bodies including the CDC and federal and state organizations. These policies and algorithms were followed during the patient's care in the ED.   Patient presents with progressive vision loss, initially only involving the left eye and now bilateral with unremarkable ophthalmologic exam.  Will obtain ESR, CRP, MRI brain and orbits.  Not consistent with LVO.  Unlikely ischemic stroke and not a candidate for TNKase.  Clinical Course as of 10/02/21 2057  Wed Oct 02, 2021  1521 Case d/w neuro Dr. KLeonel Ramsaywho is evaluating pt currently.  [PS]  [id number] report reviewed, unremarkable.  ESR is  normal. [PS]  2026 Pt had improvement with migraine cocktail, but still refractory symptoms.  Dr. Leonel Ramsay recommends continued tx of complicated migraine / status migrainosus, admit and reassess tomorrow to determine if further workup for possible optic neuritis is necessary. [PS]    Clinical Course User Index [PS] Carrie Mew, MD     ____________________________________________   FINAL CLINICAL IMPRESSION(S) / ED DIAGNOSES    Final diagnoses:  Status migrainosus     ED Discharge Orders     None       Portions of this note were generated with dragon dictation software. Dictation errors may occur despite best attempts at proofreading.    Carrie Mew, MD 10/02/21 2057

## 2021-10-02 NOTE — ED Notes (Signed)
Pt ambulated to the restroom without assistance

## 2021-10-02 NOTE — ED Triage Notes (Signed)
Pt comes into the ED via POV c/o vision loss in the left eye 3 weeks ago and vision loss in the right eye this morning with LKW being at 7:00.  Pt's eye MD wants her evaluated for possible CVA.  Pt also c/o right side headache.  Pt denies any blood thinner use.

## 2021-10-02 NOTE — ED Notes (Signed)
Pt taken to mri

## 2021-10-02 NOTE — H&P (Signed)
History and Physical    Regina Short YFV:[id number] DOB: February 17, 1961 DOA: 10/02/2021  PCP: Mylinda Latina, PA-C   Patient coming from: home  I have personally briefly reviewed patient's old medical records in [place]  Chief Complaint: eye pain and blurred vision  HPI: Regina Short is a 60 y.o. female with medical history significant for Diabetes and hypertension, with past history of headaches but no formal diagnosis of migraine, who presents to the ED with a 3-week history of blurred vision of the left eye associated with pain in the left eye of fluctuating intensity.  On the day prior to arrival she started having similar symptoms in her right eye and was seen by an ophthalmologist, Dr. Edison Pace on the day of arrival who found no significant abnormal findings.  She does presented to the emergency room.  ED course: On arrival, vitals unremarkable, Blood work also unremarkable except for mildly elevated blood glucose of 218.  Sed rate normal at 5 mm/h  EKG, personally viewed and interpreted: Sinus rhythm at 68 with no acute ST-T wave changes  Imaging: Patient had MRI with and without contrast of the brain and orbits that showed no acute intracranial process  Throughout the evaluation, the ED physician, Dr. Joni Fears communicated with neurologist Dr. Leonel Ramsay who did a consult.  Based on the results of the recommended work-up, he opined that patient might be having a complicated migraine, with low suspicion for optic neuritis.  Patient improved with Compazine, Depakote.  Hospitalist consulted for admission for management of continued response to intervention. Review of Systems: As per HPI otherwise all other systems on review of systems negative.    Past Medical History:  Diagnosis Date   Chronic back pain    Diabetes mellitus without complication (HCC)    GERD (gastroesophageal reflux disease)    Hand tendonitis    Hypertension     Past Surgical History:  Procedure  Laterality Date   ABDOMINAL HYSTERECTOMY     ABDOMINAL SURGERY     5 tumors removed   BREAST BIOPSY Right 02/02/15 core   focal fibroadenomatoid changes and sclerosis     reports that she has been smoking cigarettes. She has been smoking an average of .5 packs per day. She has never used smokeless tobacco. She reports current alcohol use. She reports that she does not use drugs.  Allergies  Allergen Reactions   Micardis [Telmisartan]     rash   Ciprofloxacin Rash   Tramadol Rash    Family History  Problem Relation Age of Onset   Congestive Heart Failure Other    Breast cancer Maternal Aunt 60     Prior to Admission medications   Medication Sig Start Date End Date Taking? Authorizing Provider  benazepril (LOTENSIN) 10 MG tablet Take 1 tablet (10 mg total) by mouth daily. 01/04/21   Lavera Guise, MD  empagliflozin (JARDIANCE) 10 MG TABS tablet Take 1 tablet (10 mg total) by mouth daily before breakfast. 08/09/21   McDonough, Si Gaul, PA-C  ergocalciferol (DRISDOL) 1.25 MG (50000 UT) capsule Take 1 capsule (50,000 Units total) by mouth once a week. 04/19/20   Ronnell Freshwater, NP  fluticasone (FLONASE) 50 MCG/ACT nasal spray Place 2 sprays into both nostrils daily. 05/20/19   Ronnell Freshwater, NP  glucose blood (ACCU-CHEK GUIDE) test strip Check blood sugars BID and prn - E11.65 08/09/21   McDonough, Si Gaul, PA-C  glyBURIDE-metformin (GLUCOVANCE) 5-500 MG tablet Take 1.5 tablets po BID or  if blood sugar drops take 1 tablet po BID 01/25/21   Lavera Guise, MD  Lancets Misc. (ACCU-CHEK FASTCLIX LANCET) KIT Accu-Chek [facility]    [provider]    Physical Exam: Vitals:   10/02/21 1515 10/02/21 1530 10/02/21 1800 10/02/21 1930  BP: (!) 152/83 (!) 147/73 132/82 (!) 143/80  Pulse: 64 (!) 58 (!) 50 60  Resp: _0 Temp:      TempSrc:      SpO2: 98% 99% 99% 100%  Weight:      Height:         Vitals:   10/02/21 1515 10/02/21 1530 10/02/21 1800  10/02/21 1930  BP: (!) 152/83 (!) 147/73 132/82 (!) 143/80  Pulse: 64 (!) 58 (!) 50 60  Resp: _1 Temp:      TempSrc:      SpO2: 98% 99% 99% 100%  Weight:      Height:       Constitutional:   Alert and oriented. Non-toxic appearance. Eyes:   Conjunctivae are normal. EOMI.  ENT      Head:   Normocephalic and atraumatic.      Nose:   Normal      Mouth/Throat:   Normal, moist mucosa.      Neck:   No meningismus. Full ROM. Hematological/Lymphatic/Immunilogical:   No cervical lymphadenopathy. Cardiovascular:   RRR. Symmetric bilateral radial and DP pulses.  No murmurs. Cap refill less than 2 seconds. Respiratory:   Normal respiratory effort without tachypnea/retractions. Breath sounds are clear and equal bilaterally. No wheezes/rales/rhonchi. Gastrointestinal:   Soft and nontender. Non distended. There is no CVA tenderness.  No rebound, rigidity, or guarding. Genitourinary:   deferred Musculoskeletal:   Normal range of motion in all extremities. No joint effusions.  No lower extremity tenderness.  No edema. Neurologic:   Normal speech and language.  Motor grossly intact. No acute focal neurologic deficits are appreciated.  Skin:    Skin is warm, dry and intact. No rash noted.  No petechiae, purpura, or bullae.   Labs on Admission: I have personally reviewed following labs and imaging studies  CBC: Recent Labs  Lab 10/02/21 1241  WBC 8.6  NEUTROABS 5.8  HGB 13.5  HCT 39.9  MCV 80.4  PLT 948   Basic Metabolic Panel: Recent Labs  Lab 10/02/21 1241  NA 136  K 4.5  CL 99  CO2 25  GLUCOSE 218*  BUN 16  CREATININE 0.60  CALCIUM 9.9   GFR: Estimated Creatinine Clearance: 70 mL/min (by C-G formula based on SCr of 0.6 mg/dL). Liver Function Tests: Recent Labs  Lab 10/02/21 1241  AST 13*  ALT 13  ALKPHOS 80  BILITOT 0.4  PROT 7.2  ALBUMIN 3.9   No results for input(s): LIPASE, AMYLASE in the last 168 hours. No results for input(s): AMMONIA in the last  168 hours. Coagulation Profile: Recent Labs  Lab 10/02/21 1241  INR 0.9   Cardiac Enzymes: No results for input(s): CKTOTAL, CKMB, CKMBINDEX, TROPONINI in the last 168 hours. BNP (last 3 results) No results for input(s): PROBNP in the last 8760 hours. HbA1C: No results for input(s): HGBA1C in the last 72 hours. CBG: No results for input(s): GLUCAP in the last 168 hours. Lipid Profile: No results for input(s): CHOL, HDL, LDLCALC, TRIG, CHOLHDL, LDLDIRECT in the last 72 hours. Thyroid Function Tests: No results for input(s): TSH, T4TOTAL, FREET4, T3FREE, THYROIDAB in the last 72 hours. Anemia Panel:  No results for input(s): VITAMINB12, FOLATE, FERRITIN, TIBC, IRON, RETICCTPCT in the last 72 hours. Urine analysis:    Component Value Date/Time   COLORURINE YELLOW (A) 02/04/2016 1006   APPEARANCEUR Turbid (A) 09/28/2020 1228   LABSPEC 1.033 (H) 02/04/2016 1006   LABSPEC 1.028 01/03/2013 1608   PHURINE 5.0 02/04/2016 1006   GLUCOSEU 1+ (A) 09/28/2020 1228   GLUCOSEU >=500 01/03/2013 1608   HGBUR 1+ (A) 02/04/2016 1006   BILIRUBINUR Negative 09/28/2020 1228   BILIRUBINUR Negative 01/03/2013 1608   KETONESUR TRACE (A) 02/04/2016 1006   PROTEINUR Trace 09/28/2020 1228   PROTEINUR NEGATIVE 02/04/2016 1006   NITRITE Negative 09/28/2020 1228   NITRITE NEGATIVE 02/04/2016 1006   LEUKOCYTESUR 1+ (A) 09/28/2020 1228   LEUKOCYTESUR Negative 01/03/2013 1608    Radiological Exams on Admission: CT HEAD WO CONTRAST  Result Date: 10/02/2021 CLINICAL DATA:  Stroke suspected. Vision loss and left eye. Neurologic deficit. EXAM: CT HEAD WITHOUT CONTRAST TECHNIQUE: Contiguous axial images were obtained from the base of the skull through the vertex without intravenous contrast. COMPARISON:  None. FINDINGS: Brain: No evidence of acute infarction, hemorrhage, hydrocephalus, extra-axial collection or mass lesion/mass effect. Vascular: No hyperdense vessel or unexpected calcification. Skull: Normal.  Negative for fracture or focal lesion. Sinuses/Orbits: No acute finding. Other: None. IMPRESSION: No acute intracranial abnormalities. Normal brain. Electronically Signed   By: Kerby Moors M.D.   On: 10/02/2021 13:23   MR Brain W and Wo Contrast  Result Date: 10/02/2021 CLINICAL DATA:  Three weeks of vision loss, concern for multiple sclerosis EXAM: MRI HEAD AND ORBITS WITHOUT AND WITH CONTRAST TECHNIQUE: Multiplanar, multiecho pulse sequences of the brain and surrounding structures were obtained without and with intravenous contrast. Multiplanar, multiecho pulse sequences of the orbits and surrounding structures were obtained including fat saturation techniques, before and after intravenous contrast administration. CONTRAST:  25m GADAVIST GADOBUTROL 1 MMOL/ML IV SOLN COMPARISON:  No prior MRI, correlation is made with CT head 10/02/2021 FINDINGS: MRI HEAD FINDINGS Brain: No acute infarction, hemorrhage, hydrocephalus, extra-axial collection or mass lesion. A few T2 hyperintense foci are noted in the bilateral periventricular white matter; no foci involving the corpus callosum, juxtacortical white matter, infratentorially, or in the imaged cervical spine. No abnormal enhancement. Prominence of the right greater than left cerebellar tonsils, which extend up to 4 mm the on the foramen magnum. Vascular: Normal flow voids. Skull and upper cervical spine: Normal marrow signal. Other: The mastoids are well aerated. MRI ORBITS FINDINGS Evaluation is somewhat limited by motion artifact. Orbits: No traumatic or inflammatory finding. Globes, optic nerves, orbital fat, extraocular muscles, vascular structures, and lacrimal glands are normal. No definite abnormal enhancement. Visualized sinuses: Mucosal thickening in the left maxillary sinus. Soft tissues: Negative. IMPRESSION: 1. No acute intracranial process. Fall there are scattered T2 hyperintense foci in the bilateral periventricular white matter, these are not in  a pattern suspicious for demyelinating disease and are favored to be the sequela of small vessel ischemic disease. 2. Evaluation the orbits is somewhat limited by motion artifact. Within this limitation, normal appearance of the orbits, without abnormal signal. Electronically Signed   By: AMerilyn BabaM.D.   On: 10/02/2021 17:30   MR ORBITS W WO CONTRAST  Result Date: 10/02/2021 CLINICAL DATA:  Three weeks of vision loss, concern for multiple sclerosis EXAM: MRI HEAD AND ORBITS WITHOUT AND WITH CONTRAST TECHNIQUE: Multiplanar, multiecho pulse sequences of the brain and surrounding structures were obtained without and with intravenous contrast. Multiplanar, multiecho pulse sequences of  the orbits and surrounding structures were obtained including fat saturation techniques, before and after intravenous contrast administration. CONTRAST:  7m GADAVIST GADOBUTROL 1 MMOL/ML IV SOLN COMPARISON:  No prior MRI, correlation is made with CT head 10/02/2021 FINDINGS: MRI HEAD FINDINGS Brain: No acute infarction, hemorrhage, hydrocephalus, extra-axial collection or mass lesion. A few T2 hyperintense foci are noted in the bilateral periventricular white matter; no foci involving the corpus callosum, juxtacortical white matter, infratentorially, or in the imaged cervical spine. No abnormal enhancement. Prominence of the right greater than left cerebellar tonsils, which extend up to 4 mm the on the foramen magnum. Vascular: Normal flow voids. Skull and upper cervical spine: Normal marrow signal. Other: The mastoids are well aerated. MRI ORBITS FINDINGS Evaluation is somewhat limited by motion artifact. Orbits: No traumatic or inflammatory finding. Globes, optic nerves, orbital fat, extraocular muscles, vascular structures, and lacrimal glands are normal. No definite abnormal enhancement. Visualized sinuses: Mucosal thickening in the left maxillary sinus. Soft tissues: Negative. IMPRESSION: 1. No acute intracranial process.  Fall there are scattered T2 hyperintense foci in the bilateral periventricular white matter, these are not in a pattern suspicious for demyelinating disease and are favored to be the sequela of small vessel ischemic disease. 2. Evaluation the orbits is somewhat limited by motion artifact. Within this limitation, normal appearance of the orbits, without abnormal signal. Electronically Signed   By: AMerilyn BabaM.D.   On: 10/02/2021 17:30     Assessment/Plan 60year old female with history of diabetes and hypertension, with past history of headaches presenting with a 3-week history of pain and blurred vision of the left eye, now affecting the right eye, with normal outpatient ophthalmology work-up        Vision disturbance    Suspect Complicated migraine with status migrainosus -Headache and vision disturbance with negative outpatient ophthalmology exam, Dr. KEdison Pace- MRI with and without contrast of the brain and orbits  showed no acute intracranial process - Sed rate was normal at 5 mm/h -Had neurology consult and follow-up in the ED - Continue scheduled Compazine and Depakote per neurology recommendation - Also received one-time Depakote, Benadryl and magnesium in the ED with improvement - Per neurology: If not improved, LP and high-dose steroids for possible optic neuritis, temporal arteritis -We will hold Lovenox prophylaxis tonight in case LP is required in the a.m.    Type 2 diabetes mellitus with hyperglycemia, - Sliding scale insulin coverage    Essential hypertension - Continue benazepril    DVT prophylaxis: Hold Lovenox x1 night for possible LP Code Status: full code  Family Communication:  none  Disposition Plan: Back to previous home environment Consults called:  neurology Status:At the time of admission, it appears that the appropriate admission status for this patient is INPATIENT. This is judged to be reasonable and necessary in order to provide the required intensity of  service to ensure the patient's safety given the presenting symptoms, physical exam findings, and initial radiographic and laboratory data in the context of their  Comorbid conditions.   Patient requires inpatient status due to high intensity of service, high risk for further deterioration and high frequency of surveillance required.   I certify that at the point of admission it is my clinical judgment that the patient will require inpatient hospital care spanning beyond 2[facility]MD [facility]     10/02/2021, 8:37 PM

## 2021-10-02 NOTE — Progress Notes (Addendum)
Pt is complaining of being hungry. Passed swallow screen test. NP Morrsion made aware. Will continue to monitor.  Update 2142: NP Jon Billings placed order. Will continue to monitor.

## 2021-10-02 NOTE — ED Notes (Signed)
Pt to MRI

## 2021-10-02 NOTE — ED Notes (Signed)
Pts jewelry & wire bra removed and placed in a patient belongs bag and provided to the patients daughter.

## 2021-10-02 NOTE — ED Notes (Signed)
MRI called to perform the questionnaire.

## 2021-10-03 ENCOUNTER — Other Ambulatory Visit: Payer: Self-pay | Admitting: Neurology

## 2021-10-03 DIAGNOSIS — G43101 Migraine with aura, not intractable, with status migrainosus: Principal | ICD-10-CM

## 2021-10-03 DIAGNOSIS — H539 Unspecified visual disturbance: Secondary | ICD-10-CM

## 2021-10-03 LAB — CSF CELL COUNT WITH DIFFERENTIAL
Eosinophils, CSF: 0 %
Lymphs, CSF: 67 %
Monocyte-Macrophage-Spinal Fluid: 0 %
RBC Count, CSF: 0 /mm3 (ref 0–3)
Segmented Neutrophils-CSF: 33 %
Tube #: 3
WBC, CSF: 5 /mm3 (ref 0–5)

## 2021-10-03 LAB — CBG MONITORING, ED: Glucose-Capillary: 118 mg/dL — ABNORMAL HIGH (ref 70–99)

## 2021-10-03 LAB — HIV ANTIBODY (ROUTINE TESTING W REFLEX): HIV Screen 4th Generation wRfx: NONREACTIVE

## 2021-10-03 LAB — C-REACTIVE PROTEIN: CRP: <0.5 mg/dL (ref <1.0)

## 2021-10-03 LAB — PROTEIN AND GLUCOSE, CSF
Glucose, CSF: 85 mg/dL — ABNORMAL HIGH (ref 40–70)
Total  Protein, CSF: 27 mg/dL (ref 15–45)

## 2021-10-03 MED ORDER — LIDOCAINE HCL (PF) 1 % IJ SOLN: 5.0000 mL | Freq: Once | INTRAMUSCULAR | Status: AC

## 2021-10-03 MED ORDER — PANTOPRAZOLE SODIUM 40 MG IV SOLR: 40.0000 mg | INTRAVENOUS | Status: DC

## 2021-10-03 MED ORDER — METHYLPREDNISOLONE SODIUM SUCC 1000 MG IJ SOLR
1000.0000 mg | INTRAMUSCULAR | Status: DC
  Filled 2021-10-03: qty 16

## 2021-10-03 MED ORDER — LIDOCAINE HCL (PF) 1 % IJ SOLN
INTRAMUSCULAR | Status: AC
  Administered 2021-10-03: 5 mL
  Filled 2021-10-03: qty 5

## 2021-10-03 MED ORDER — PANTOPRAZOLE SODIUM 40 MG IV SOLR
40.0000 mg | INTRAVENOUS | Status: AC
  Administered 2021-10-03 – 2021-10-06 (×4): 40 mg via INTRAVENOUS
  Filled 2021-10-03 (×4): qty 40

## 2021-10-03 MED ORDER — SODIUM CHLORIDE 0.9 % IV SOLN
1000.0000 mg | INTRAVENOUS | Status: AC
  Administered 2021-10-03 – 2021-10-06 (×4): 1000 mg via INTRAVENOUS
  Filled 2021-10-03 (×4): qty 16

## 2021-10-03 MED ORDER — METHYLPREDNISOLONE SODIUM SUCC 1000 MG IJ SOLR: 1000.0000 mg | INTRAMUSCULAR | Status: DC

## 2021-10-03 NOTE — ED Notes (Signed)
Pt ambulated with one staff assist to restroom with no difficulty. Pt returned safely to bedside.

## 2021-10-03 NOTE — ED Notes (Signed)
This RN to bedside. Pt. Resting in bed, watching TV. Alert and oriented. Pt's daughters at bedside. Denies any need currently.

## 2021-10-03 NOTE — Procedures (Signed)
Indication: Optic Neuritis  Risks of the procedure were dicussed with the patient including post-LP headache, bleeding, infection, weakness/numbness of legs(radiculopathy), death.  The patient/patient's proxy agreed and written consent was obtained.   The patient was prepped and draped in the sitting position, and using sterile technique a 20 gauge quinke spinal needle was inserted in the L3-4 space. Bony resistance was repeatedly met and therefore the L4-5 space was used with initially blood tinged CSF obtained that quickly cleared. Approximately 9 cc of CSF were obtained and sent for analysis.    Roland Rack, MD [facility] [phone]  If 7pm- 7am, please page neurology on call as listed in [place].  ;

## 2021-10-03 NOTE — Progress Notes (Signed)
PROGRESS NOTE    Regina Short  ZOX:[id number] DOB: Jun 17, 1961 DOA: 10/02/2021 PCP: Mylinda Latina, PA-C    Brief Narrative:  Regina Short is a 60 year old female with past medical history significant for type 2 diabetes mellitus, essential hypertension, history of headaches who presented to [facility] ED on 10/12 with 3-week history of blurred vision with associated left eye pain of fluctuating intensity.  Now patient continued to have similar symptoms in her right eye and was seen by ophthalmology, Dr. Edison Pace on the day of arrival who found no significant abnormal findings.  Given her persistence of symptoms and now affecting her right side, patient presented to ED for further evaluation.  In the ED, temperature 98.0 F, HR 73, RR 18, BP 122/77, SPO2 98% on room air.  Sodium 136, potassium 4.5, chloride 99, CO2 25, BUN 16, creatinine 0.60, glucose 218, AST 13, ALT 13, total bilirubin 0.4.  WBC 8.6, hemoglobin 13.5, platelets 248.  CRP less than 0.5.  ESR 5.  COVID-19 PCR negative.  Influenza A/B PCR negative.  CT head without contrast with no acute intracranial normalities.  MR brain with and without contrast with no acute infarction, no hemorrhage, no hydrocephalus no extra-axial collection or mass lesion; a few T2 hyperintense focus I noted bilateral periventricular white matter with no foci involving corpus callosum, juxtacortical white matter or in the imaged C-spine, no abnormal enhancement; not consistent with demyelinating disease pattern.  MR orbits limited by motion but appears normal without abnormal signal.  Neurology was consulted.  Patient was started on Compazine and Depakote.  TRH consulted for further evaluation management of progressive headache with associated visual disturbance concerning for complicated migraine versus status migrainosus.   Assessment & Plan:   Principal Problem:   Complicated migraine with status migrainosus Active Problems:   Essential hypertension   Type 2  diabetes mellitus with hyperglycemia, without long-term current use of insulin (HCC)   Vision disturbance   Headache with continued visual disturbance concern for optic neuritis vs complicated migraine Patient presenting to the ED following recent ophthalmology evaluation that was reported normal due to 3-week history of progressive headache associated with left eye visual disturbance.  Now affecting her right eye.  MR brain/orbits unrevealing and not consistent with a demyelinating process.  ESR/CRP unrevealing.  Patient is afebrile without leukocytosis. --Neurology following, appreciate assistance --This morning, patient reports headache resolved and left eye vision improved but continues with right eye blurred vision --ANA, ACE, NMO ab, RPR, HIV --Neurology plans LP today with CSF cell count, glucose, protein, IgG, oligoclonal bands CSF VDRL, ACE --Solu-Medrol 1 g IV daily x5 days  Type 2 diabetes mellitus Hemoglobin A1c 7.7 on 08/09/2021.  Home medicines include glyburide-metformin 5-500 mg Home regimen includes glyburide-metformin and Jardiance. --Hold oral hypoglycemics while inpatient. --SSI for coverage --CBGs qAC/HS  Essential hypertension BP 108/58 this morning, well controlled.  On benazepril 10 mg p.o. daily at home. --Hold home antihypertensives given adequate BP control currently --Continue monitor BP closely and will restart if needed   DVT prophylaxis: SCDs Start: 10/02/21 2212   Code Status: Full Code Family Communication: Updated patient's sister is present at bedside this morning  Disposition Plan:  Level of care: Med-Surg Status is: Inpatient  Remains inpatient appropriate because: Needs further diagnostic testing with lumbar puncture, further lab work-up, IV steroids    Consultants:  Neurology, Dr. Leonel Ramsay  Procedures:  Lumbar puncture: Pending  Antimicrobials:  None   Subjective: Patient seen examined bedside, resting comfortably.  Sleeping  but  easily arousable.  Sister present at bedside.  Headache now resolved, but continues with right eye blurred vision.  Reports left eye improved.  Work-up so far to include MR brain/orbits, head CT unrevealing with normal inflammatory markers.  Neurology plans lumbar puncture with further lab work-up today given continued complaints concerning for mild optic neuritis.  Also starting IV Solu-Medrol.  Patient with no other complaints or concerns at this time.  Denies current headache, no abdominal pain, no chest pain, no palpitations, no shortness of breath, no fever/chills/night sweats, no nausea/vomiting/diarrhea, no paresthesias.  No acute events overnight per nurse staff.  Objective: Vitals:   10/03/21 1145 10/03/21 1200 10/03/21 1215 10/03/21 1230  BP:      Pulse: 71 65 63 81  Resp: '17 17 17 19  ' Temp:      TempSrc:      SpO2: 100% 99% 98% 99%  Weight:      Height:        Intake/Output Summary (Last 24 hours) at 10/03/2021 1347 Last data filed at 10/02/2021 1909 Gross per 24 hour  Intake 50 ml  Output --  Net 50 ml   Filed Weights   10/02/21 1226  Weight: 63 kg    Examination:  General exam: Appears calm and comfortable  Respiratory system: Clear to auscultation. Respiratory effort normal. Cardiovascular system: S1 & S2 heard, RRR. No JVD, murmurs, rubs, gallops or clicks. No pedal edema. Gastrointestinal system: Abdomen is nondistended, soft and nontender. No organomegaly or masses felt. Normal bowel sounds heard. Central nervous system: Alert and oriented. Afferent pupil defect OD with min reaction Extremities: Symmetric 5 x 5 power. Skin: No rashes, lesions or ulcers Psychiatry: Judgement and insight appear normal. Mood & affect appropriate.     Data Reviewed: I have personally reviewed following labs and imaging studies  CBC: Recent Labs  Lab 10/02/21 1241  WBC 8.6  NEUTROABS 5.8  HGB 13.5  HCT 39.9  MCV 80.4  PLT 053   Basic Metabolic Panel: Recent Labs  Lab  10/02/21 1241  NA 136  K 4.5  CL 99  CO2 25  GLUCOSE 218*  BUN 16  CREATININE 0.60  CALCIUM 9.9   GFR: Estimated Creatinine Clearance: 70 mL/min (by C-G formula based on SCr of 0.6 mg/dL). Liver Function Tests: Recent Labs  Lab 10/02/21 1241  AST 13*  ALT 13  ALKPHOS 80  BILITOT 0.4  PROT 7.2  ALBUMIN 3.9   No results for input(s): LIPASE, AMYLASE in the last 168 hours. No results for input(s): AMMONIA in the last 168 hours. Coagulation Profile: Recent Labs  Lab 10/02/21 1241  INR 0.9   Cardiac Enzymes: No results for input(s): CKTOTAL, CKMB, CKMBINDEX, TROPONINI in the last 168 hours. BNP (last 3 results) No results for input(s): PROBNP in the last 8760 hours. HbA1C: No results for input(s): HGBA1C in the last 72 hours. CBG: Recent Labs  Lab 10/03/21 1309  GLUCAP 118*   Lipid Profile: No results for input(s): CHOL, HDL, LDLCALC, TRIG, CHOLHDL, LDLDIRECT in the last 72 hours. Thyroid Function Tests: No results for input(s): TSH, T4TOTAL, FREET4, T3FREE, THYROIDAB in the last 72 hours. Anemia Panel: No results for input(s): VITAMINB12, FOLATE, FERRITIN, TIBC, IRON, RETICCTPCT in the last 72 hours. Sepsis Labs: No results for input(s): PROCALCITON, LATICACIDVEN in the last 168 hours.  Recent Results (from the past 240 hour(s))  Resp Panel by RT-PCR (Flu A&B, Covid) Nasopharyngeal Swab     Status: None   Collection Time:  10/02/21  3:02 PM   Specimen: Nasopharyngeal Swab; Nasopharyngeal(NP) swabs in vial transport medium  Result Value Ref Range Status   SARS Coronavirus 2 by RT PCR NEGATIVE NEGATIVE Final    Comment: (NOTE) SARS-CoV-2 target nucleic acids are NOT DETECTED.  The SARS-CoV-2 RNA is generally detectable in upper respiratory specimens during the acute phase of infection. The lowest concentration of SARS-CoV-2 viral copies this assay can detect is 138 copies/mL. A negative result does not preclude SARS-Cov-2 infection and should not be used as  the sole basis for treatment or other patient management decisions. A negative result may occur with  improper specimen collection/handling, submission of specimen other than nasopharyngeal swab, presence of viral mutation(s) within the areas targeted by this assay, and inadequate number of viral copies(<138 copies/mL). A negative result must be combined with clinical observations, patient history, and epidemiological information. The expected result is Negative.  Fact Sheet for Patients:  EntrepreneurPulse.com.au  Fact Sheet for Healthcare Providers:  IncredibleEmployment.be  This test is no t yet approved or cleared by the [place] FDA and  has been authorized for detection and/or diagnosis of SARS-CoV-2 by FDA under an Emergency Use Authorization (EUA). This EUA will remain  in effect (meaning this test can be used) for the duration of the COVID-19 declaration under Section 564(b)(1) of the Act, 21 U.S.C.section 360bbb-3(b)(1), unless the authorization is terminated  or revoked sooner.       Influenza A by PCR NEGATIVE NEGATIVE Final   Influenza B by PCR NEGATIVE NEGATIVE Final    Comment: (NOTE) The Xpert Xpress SARS-CoV-2/FLU/RSV plus assay is intended as an aid in the diagnosis of influenza from Nasopharyngeal swab specimens and should not be used as a sole basis for treatment. Nasal washings and aspirates are unacceptable for Xpert Xpress SARS-CoV-2/FLU/RSV testing.  Fact Sheet for Patients: EntrepreneurPulse.com.au  Fact Sheet for Healthcare Providers: IncredibleEmployment.be  This test is not yet approved or cleared by the [place] FDA and has been authorized for detection and/or diagnosis of SARS-CoV-2 by FDA under an Emergency Use Authorization (EUA). This EUA will remain in effect (meaning this test can be used) for the duration of the COVID-19 declaration under Section 564(b)(1) of  the Act, 21 U.S.C. section 360bbb-3(b)(1), unless the authorization is terminated or revoked.  Performed at [facility], [address] [place], [place] [address]          Radiology Studies: CT HEAD WO CONTRAST  Result Date: 10/02/2021 CLINICAL DATA:  Stroke suspected. Vision loss and left eye. Neurologic deficit. EXAM: CT HEAD WITHOUT CONTRAST TECHNIQUE: Contiguous axial images were obtained from the base of the skull through the vertex without intravenous contrast. COMPARISON:  None. FINDINGS: Brain: No evidence of acute infarction, hemorrhage, hydrocephalus, extra-axial collection or mass lesion/mass effect. Vascular: No hyperdense vessel or unexpected calcification. Skull: Normal. Negative for fracture or focal lesion. Sinuses/Orbits: No acute finding. Other: None. IMPRESSION: No acute intracranial abnormalities. Normal brain. Electronically Signed   By: Kerby Moors M.D.   On: 10/02/2021 13:23   MR Brain W and Wo Contrast  Result Date: 10/02/2021 CLINICAL DATA:  Three weeks of vision loss, concern for multiple sclerosis EXAM: MRI HEAD AND ORBITS WITHOUT AND WITH CONTRAST TECHNIQUE: Multiplanar, multiecho pulse sequences of the brain and surrounding structures were obtained without and with intravenous contrast. Multiplanar, multiecho pulse sequences of the orbits and surrounding structures were obtained including fat saturation techniques, before and after intravenous contrast administration. CONTRAST:  55m GADAVIST GADOBUTROL 1 MMOL/ML IV  SOLN COMPARISON:  No prior MRI, correlation is made with CT head 10/02/2021 FINDINGS: MRI HEAD FINDINGS Brain: No acute infarction, hemorrhage, hydrocephalus, extra-axial collection or mass lesion. A few T2 hyperintense foci are noted in the bilateral periventricular white matter; no foci involving the corpus callosum, juxtacortical white matter, infratentorially, or in the imaged cervical spine. No abnormal enhancement. Prominence of the  right greater than left cerebellar tonsils, which extend up to 4 mm the on the foramen magnum. Vascular: Normal flow voids. Skull and upper cervical spine: Normal marrow signal. Other: The mastoids are well aerated. MRI ORBITS FINDINGS Evaluation is somewhat limited by motion artifact. Orbits: No traumatic or inflammatory finding. Globes, optic nerves, orbital fat, extraocular muscles, vascular structures, and lacrimal glands are normal. No definite abnormal enhancement. Visualized sinuses: Mucosal thickening in the left maxillary sinus. Soft tissues: Negative. IMPRESSION: 1. No acute intracranial process. Fall there are scattered T2 hyperintense foci in the bilateral periventricular white matter, these are not in a pattern suspicious for demyelinating disease and are favored to be the sequela of small vessel ischemic disease. 2. Evaluation the orbits is somewhat limited by motion artifact. Within this limitation, normal appearance of the orbits, without abnormal signal. Electronically Signed   By: Merilyn Baba M.D.   On: 10/02/2021 17:30   MR ORBITS W WO CONTRAST  Result Date: 10/02/2021 CLINICAL DATA:  Three weeks of vision loss, concern for multiple sclerosis EXAM: MRI HEAD AND ORBITS WITHOUT AND WITH CONTRAST TECHNIQUE: Multiplanar, multiecho pulse sequences of the brain and surrounding structures were obtained without and with intravenous contrast. Multiplanar, multiecho pulse sequences of the orbits and surrounding structures were obtained including fat saturation techniques, before and after intravenous contrast administration. CONTRAST:  81m GADAVIST GADOBUTROL 1 MMOL/ML IV SOLN COMPARISON:  No prior MRI, correlation is made with CT head 10/02/2021 FINDINGS: MRI HEAD FINDINGS Brain: No acute infarction, hemorrhage, hydrocephalus, extra-axial collection or mass lesion. A few T2 hyperintense foci are noted in the bilateral periventricular white matter; no foci involving the corpus callosum, juxtacortical  white matter, infratentorially, or in the imaged cervical spine. No abnormal enhancement. Prominence of the right greater than left cerebellar tonsils, which extend up to 4 mm the on the foramen magnum. Vascular: Normal flow voids. Skull and upper cervical spine: Normal marrow signal. Other: The mastoids are well aerated. MRI ORBITS FINDINGS Evaluation is somewhat limited by motion artifact. Orbits: No traumatic or inflammatory finding. Globes, optic nerves, orbital fat, extraocular muscles, vascular structures, and lacrimal glands are normal. No definite abnormal enhancement. Visualized sinuses: Mucosal thickening in the left maxillary sinus. Soft tissues: Negative. IMPRESSION: 1. No acute intracranial process. Fall there are scattered T2 hyperintense foci in the bilateral periventricular white matter, these are not in a pattern suspicious for demyelinating disease and are favored to be the sequela of small vessel ischemic disease. 2. Evaluation the orbits is somewhat limited by motion artifact. Within this limitation, normal appearance of the orbits, without abnormal signal. Electronically Signed   By: AMerilyn BabaM.D.   On: 10/02/2021 17:30        Scheduled Meds:  lidocaine (PF)       pantoprazole (PROTONIX) IV  40 mg Intravenous Q24H   Continuous Infusions:  methylPREDNISolone (SOLU-MEDROL) injection 1,000 mg (10/03/21 1244)     LOS: 1 day    Time spent: 42 minutes spent on chart review, discussion with nursing staff, consultants, updating family and interview/physical exam; more than 50% of that time was spent in counseling and/or coordination  of care.    Fanny Agan J [place] DO [facility] Available via Epic secure chat 7am-7pm After these hours, please refer to coverage provider listed on [email or web address] 10/03/2021, 1:47 PM

## 2021-10-03 NOTE — Progress Notes (Signed)
Subjective: She reports that her left eye is better, but her right eye is now significantly worse.  She states that is like her eyes closed even though it is open.  Exam: Vitals:   10/03/21 0200 10/03/21 0545  BP: (!) 105/59 (!) 108/58  Pulse: 69 78  Resp: 20 18  Temp:  98.5 F (36.9 C)  SpO2: 100% 97%   Gen: In bed, NAD Resp: non-labored breathing, no acute distress Abd: soft, nt  Neuro: MS: Awake, alert, interactive and appropriate CN: Left pupil is reactive, she has a significant afferent pupillary defect on the right, no light perception in the right eye but there is a very minimal reaction Motor: No focal weakness Sensory: No asymmetric numbness, stocking distribution numbness due to her diabetic neuropathy No clonus  Pertinent Labs: CRP < 0.5 ESR 5 CBC unremarkable  Impression: 60 year old female with initially left-sided painful blurred vision 3 weeks ago, with right eye painful blurred vision starting 10/12, now with severe right eye visual loss with afferent pupillary defect.  My suspicion is that this represents an inflammatory optic neuritis, and therefore I am pursuing IV Solu-Medrol.  The bilateral nature, as well as the improvement in the left eye or unusual, though the time course of a few weeks for pain subsidence is not unheard of with mild optic neuritis.  Given the atypical presentation, I would favor doing a lumbar puncture in addition to starting steroids.  Recommendations: 1) ANA, ACE, NMO antibody, RPR 2) LP for cells, glucose, protein, CSF VDRL, CSF ACE, [place] 3) Solu-Medrol 1 g daily for 5 days 4) Protonix while on Solu-Medrol 5) neurology will continue to follow  Roland Rack, MD [facility] [phone]  If 7pm- 7am, please page neurology on call as listed in [place].

## 2021-10-03 NOTE — ED Notes (Signed)
Dr. Amada Jupiter @ bedside.

## 2021-10-04 LAB — BASIC METABOLIC PANEL
Anion gap: 12 (ref 5–15)
BUN: 27 mg/dL — ABNORMAL HIGH (ref 6–20)
CO2: 24 mmol/L (ref 22–32)
Calcium: 9.5 mg/dL (ref 8.9–10.3)
Chloride: 92 mmol/L — ABNORMAL LOW (ref 98–111)
Creatinine, Ser: 1.04 mg/dL — ABNORMAL HIGH (ref 0.44–1.00)
GFR, Estimated: >60 mL/min (ref >60)
Glucose, Bld: 870 mg/dL (ref 70–99)
Potassium: 5.6 mmol/L — ABNORMAL HIGH (ref 3.5–5.1)
Sodium: 128 mmol/L — ABNORMAL LOW (ref 135–145)

## 2021-10-04 LAB — GLUCOSE, CAPILLARY
Glucose-Capillary: >600 mg/dL (ref 70–99)
Glucose-Capillary: 518 mg/dL (ref 70–99)

## 2021-10-04 LAB — RPR: RPR Ser Ql: NONREACTIVE

## 2021-10-04 LAB — NEUROMYELITIS OPTICA AUTOAB, IGG: NMO-IgG: <1.5 U/mL (ref 0.0–3.0)

## 2021-10-04 LAB — ANGIOTENSIN CONVERTING ENZYME: Angiotensin-Converting Enzyme: 32 U/L (ref 14–82)

## 2021-10-04 LAB — ANA: Anti Nuclear Antibody (ANA): NEGATIVE

## 2021-10-04 MED ORDER — POLYETHYLENE GLYCOL 3350 17 G PO PACK
17.0000 g | PACK | Freq: Every day | ORAL | Status: DC | PRN
  Administered 2021-10-04: 17 g via ORAL
  Filled 2021-10-04: qty 1

## 2021-10-04 MED ORDER — HYDROCODONE-ACETAMINOPHEN 5-325 MG PO TABS
1.0000 | ORAL_TABLET | ORAL | Status: AC | PRN
Start: 2021-10-04 — End: 2021-10-05

## 2021-10-04 MED ORDER — METHOCARBAMOL 500 MG PO TABS
500.0000 mg | ORAL_TABLET | Freq: Once | ORAL | Status: AC
  Administered 2021-10-04: 500 mg via ORAL
  Filled 2021-10-04: qty 1

## 2021-10-04 MED ORDER — SENNOSIDES-DOCUSATE SODIUM 8.6-50 MG PO TABS
1.0000 | ORAL_TABLET | Freq: Two times a day (BID) | ORAL | Status: DC
  Administered 2021-10-04 – 2021-10-07 (×7): 1 via ORAL
  Filled 2021-10-04 (×9): qty 1

## 2021-10-04 MED ORDER — INSULIN ASPART 100 UNIT/ML IJ SOLN
20.0000 [IU] | Freq: Once | INTRAMUSCULAR | Status: AC
  Administered 2021-10-04: 20 [IU] via SUBCUTANEOUS
  Filled 2021-10-04: qty 1

## 2021-10-04 MED ORDER — OXYCODONE HCL 5 MG PO TABS
5.0000 mg | ORAL_TABLET | Freq: Four times a day (QID) | ORAL | Status: DC | PRN
  Administered 2021-10-04 (×2): 5 mg via ORAL
  Filled 2021-10-04 (×2): qty 1

## 2021-10-04 MED ORDER — INSULIN ASPART 100 UNIT/ML IJ SOLN
0.0000 [IU] | Freq: Three times a day (TID) | INTRAMUSCULAR | Status: DC
  Administered 2021-10-05: 11 [IU] via SUBCUTANEOUS
  Administered 2021-10-05: 20 [IU] via SUBCUTANEOUS
  Administered 2021-10-05: 11 [IU] via SUBCUTANEOUS
  Administered 2021-10-06: 20 [IU] via SUBCUTANEOUS
  Administered 2021-10-06: 7 [IU] via SUBCUTANEOUS
  Administered 2021-10-07: 3 [IU] via SUBCUTANEOUS
  Administered 2021-10-07: 11 [IU] via SUBCUTANEOUS
  Administered 2021-10-07: 3 [IU] via SUBCUTANEOUS
  Administered 2021-10-08: 4 [IU] via SUBCUTANEOUS
  Administered 2021-10-08: 15 [IU] via SUBCUTANEOUS
  Filled 2021-10-04 (×10): qty 1

## 2021-10-04 MED ORDER — INSULIN ASPART 100 UNIT/ML IJ SOLN
0.0000 [IU] | Freq: Every day | INTRAMUSCULAR | Status: DC
  Administered 2021-10-05: 4 [IU] via SUBCUTANEOUS
  Administered 2021-10-06: 5 [IU] via SUBCUTANEOUS
  Administered 2021-10-07: 4 [IU] via SUBCUTANEOUS
  Filled 2021-10-04 (×3): qty 1

## 2021-10-04 NOTE — Progress Notes (Signed)
PROGRESS NOTE    Regina Short  YEB:[id number] DOB: 04-21-1961 DOA: 10/02/2021 PCP: Mylinda Latina, PA-C    Brief Narrative:  Regina Short is a 60 year old female with past medical history significant for type 2 diabetes mellitus, essential hypertension, history of headaches who presented to [facility] ED on 10/12 with 3-week history of blurred vision with associated left eye pain of fluctuating intensity.  Now patient continued to have similar symptoms in her right eye and was seen by ophthalmology, Dr. Edison Pace on the day of arrival who found no significant abnormal findings.  Given her persistence of symptoms and now affecting her right side, patient presented to ED for further evaluation.  In the ED, temperature 98.0 F, HR 73, RR 18, BP 122/77, SPO2 98% on room air.  Sodium 136, potassium 4.5, chloride 99, CO2 25, BUN 16, creatinine 0.60, glucose 218, AST 13, ALT 13, total bilirubin 0.4.  WBC 8.6, hemoglobin 13.5, platelets 248.  CRP less than 0.5.  ESR 5.  COVID-19 PCR negative.  Influenza A/B PCR negative.  CT head without contrast with no acute intracranial normalities.  MR brain with and without contrast with no acute infarction, no hemorrhage, no hydrocephalus no extra-axial collection or mass lesion; a few T2 hyperintense focus I noted bilateral periventricular white matter with no foci involving corpus callosum, juxtacortical white matter or in the imaged C-spine, no abnormal enhancement; not consistent with demyelinating disease pattern.  MR orbits limited by motion but appears normal without abnormal signal.  Neurology was consulted.  Patient was started on Compazine and Depakote.  TRH consulted for further evaluation management of progressive headache with associated visual disturbance concerning for complicated migraine versus status migrainosus.   Assessment & Plan:   Principal Problem:   Complicated migraine with status migrainosus Active Problems:   Essential hypertension   Type 2  diabetes mellitus with hyperglycemia, without long-term current use of insulin (HCC)   Vision disturbance   Headache with continued visual disturbance concern for optic neuritis vs complicated migraine Patient presenting to the ED following recent ophthalmology evaluation that was reported normal due to 3-week history of progressive headache associated with left eye visual disturbance.  Now affecting her right eye.  MR brain/orbits unrevealing and not consistent with a demyelinating process.  ESR/CRP unrevealing.  Patient is afebrile without leukocytosis.  HIV nonreactive.  Patient underwent lumbar puncture by neurology on 10/03/2021.  CSF cell count, glucose and protein unrevealing. --Neurology following, appreciate assistance --This morning, patient reports headache resolved and left eye vision improved but continues with right eye blurred vision --ANA, ACE, NMO ab, RPR: Pending --CSF IgG, oligoclonal bands CSF VDRL, ACE: Pending --Continues on Solu-Medrol 1 g IV daily x5 days  Type 2 diabetes mellitus Hemoglobin A1c 7.7 on 08/09/2021.  Home medicines include glyburide-metformin 5-500 mg Home regimen includes glyburide-metformin and Jardiance. --Hold oral hypoglycemics while inpatient. --SSI for coverage --CBGs qAC/HS  Essential hypertension BP 126/81 this morning, well controlled.  On benazepril 10 mg p.o. daily at home. --Hold home antihypertensives given adequate BP control currently --Continue monitor BP closely and will restart if needed   DVT prophylaxis: SCDs Start: 10/02/21 2212   Code Status: Full Code Family Communication: Updated multiple family members present at bedside this morning.  Disposition Plan:  Level of care: Med-Surg Status is: Inpatient  Remains inpatient appropriate because: Awaiting multiple labs, continues on IV steroids per neurology.    Consultants:  Neurology, Dr. Leonel Ramsay  Procedures:  Lumbar puncture: 10/13  Antimicrobials:  None   Subjective: Patient seen examined bedside, resting comfortably.  Multiple family members present.  Continues without headache.  Continues with right eye blurred vision, states "gray appearance"; but improved from yesterday.  Also complains of mild irritation to lumbar puncture site.  Continues on IV Solu-Medrol per neurology.  No other questions or concerns at this time. Denies headache, no abdominal pain, no chest pain, no palpitations, no shortness of breath, no fever/chills/night sweats, no nausea/vomiting/diarrhea, no paresthesias.  No acute events overnight per nursing staff.  Objective: Vitals:   10/03/21 1540 10/03/21 2223 10/04/21 0412 10/04/21 0846  BP: 126/77 120/70 126/81 121/73  Pulse: 76 74 62 64  Resp: _0 Temp: 99.1 F (37.3 C) 98.5 F (36.9 C) 98.2 F (36.8 C) 97.8 F (36.6 C)  TempSrc: Oral Oral Oral Oral  SpO2: 95% 97% 98% 100%  Weight:      Height:        Intake/Output Summary (Last 24 hours) at 10/04/2021 1157 Last data filed at 10/04/2021 0655 Gross per 24 hour  Intake 62.3 ml  Output --  Net 62.3 ml   Filed Weights   10/02/21 1226  Weight: 63 kg    Examination:  General exam: Appears calm and comfortable  Respiratory system: Clear to auscultation. Respiratory effort normal.  On room air Cardiovascular system: S1 & S2 heard, RRR. No JVD, murmurs, rubs, gallops or clicks. No pedal edema. Gastrointestinal system: Abdomen is nondistended, soft and nontender. No organomegaly or masses felt. Normal bowel sounds heard. Central nervous system: Alert and oriented. Afferent pupil defect OD with min reaction Extremities: Symmetric 5 x 5 power. Skin: No rashes, lesions or ulcers Psychiatry: Judgement and insight appear normal. Mood & affect appropriate.     Data Reviewed: I have personally reviewed following labs and imaging studies  CBC: Recent Labs  Lab 10/02/21 1241  WBC 8.6  NEUTROABS 5.8  HGB 13.5  HCT 39.9  MCV 80.4  PLT  638   Basic Metabolic Panel: Recent Labs  Lab 10/02/21 1241  NA 136  K 4.5  CL 99  CO2 25  GLUCOSE 218*  BUN 16  CREATININE 0.60  CALCIUM 9.9   GFR: Estimated Creatinine Clearance: 70 mL/min (by C-G formula based on SCr of 0.6 mg/dL). Liver Function Tests: Recent Labs  Lab 10/02/21 1241  AST 13*  ALT 13  ALKPHOS 80  BILITOT 0.4  PROT 7.2  ALBUMIN 3.9   No results for input(s): LIPASE, AMYLASE in the last 168 hours. No results for input(s): AMMONIA in the last 168 hours. Coagulation Profile: Recent Labs  Lab 10/02/21 1241  INR 0.9   Cardiac Enzymes: No results for input(s): CKTOTAL, CKMB, CKMBINDEX, TROPONINI in the last 168 hours. BNP (last 3 results) No results for input(s): PROBNP in the last 8760 hours. HbA1C: No results for input(s): HGBA1C in the last 72 hours. CBG: Recent Labs  Lab 10/03/21 1309  GLUCAP 118*   Lipid Profile: No results for input(s): CHOL, HDL, LDLCALC, TRIG, CHOLHDL, LDLDIRECT in the last 72 hours. Thyroid Function Tests: No results for input(s): TSH, T4TOTAL, FREET4, T3FREE, THYROIDAB in the last 72 hours. Anemia Panel: No results for input(s): VITAMINB12, FOLATE, FERRITIN, TIBC, IRON, RETICCTPCT in the last 72 hours. Sepsis Labs: No results for input(s): PROCALCITON, LATICACIDVEN in the last 168 hours.  Recent Results (from the past 240 hour(s))  Resp Panel by RT-PCR (Flu A&B, Covid) Nasopharyngeal Swab     Status: None   Collection Time: 10/02/21  3:02 PM   Specimen: Nasopharyngeal Swab; Nasopharyngeal(NP) swabs in vial transport medium  Result Value Ref Range Status   SARS Coronavirus 2 by RT PCR NEGATIVE NEGATIVE Final    Comment: (NOTE) SARS-CoV-2 target nucleic acids are NOT DETECTED.  The SARS-CoV-2 RNA is generally detectable in upper respiratory specimens during the acute phase of infection. The lowest concentration of SARS-CoV-2 viral copies this assay can detect is 138 copies/mL. A negative result does not  preclude SARS-Cov-2 infection and should not be used as the sole basis for treatment or other patient management decisions. A negative result may occur with  improper specimen collection/handling, submission of specimen other than nasopharyngeal swab, presence of viral mutation(s) within the areas targeted by this assay, and inadequate number of viral copies(<138 copies/mL). A negative result must be combined with clinical observations, patient history, and epidemiological information. The expected result is Negative.  Fact Sheet for Patients:  EntrepreneurPulse.com.au  Fact Sheet for Healthcare Providers:  IncredibleEmployment.be  This test is no t yet approved or cleared by the [place] FDA and  has been authorized for detection and/or diagnosis of SARS-CoV-2 by FDA under an Emergency Use Authorization (EUA). This EUA will remain  in effect (meaning this test can be used) for the duration of the COVID-19 declaration under Section 564(b)(1) of the Act, 21 U.S.C.section 360bbb-3(b)(1), unless the authorization is terminated  or revoked sooner.       Influenza A by PCR NEGATIVE NEGATIVE Final   Influenza B by PCR NEGATIVE NEGATIVE Final    Comment: (NOTE) The Xpert Xpress SARS-CoV-2/FLU/RSV plus assay is intended as an aid in the diagnosis of influenza from Nasopharyngeal swab specimens and should not be used as a sole basis for treatment. Nasal washings and aspirates are unacceptable for Xpert Xpress SARS-CoV-2/FLU/RSV testing.  Fact Sheet for Patients: EntrepreneurPulse.com.au  Fact Sheet for Healthcare Providers: IncredibleEmployment.be  This test is not yet approved or cleared by the [place] FDA and has been authorized for detection and/or diagnosis of SARS-CoV-2 by FDA under an Emergency Use Authorization (EUA). This EUA will remain in effect (meaning this test can be used) for the  duration of the COVID-19 declaration under Section 564(b)(1) of the Act, 21 U.S.C. section 360bbb-3(b)(1), unless the authorization is terminated or revoked.  Performed at [facility], [address], [place], [place] [address]          Radiology Studies: CT HEAD WO CONTRAST  Result Date: 10/02/2021 CLINICAL DATA:  Stroke suspected. Vision loss and left eye. Neurologic deficit. EXAM: CT HEAD WITHOUT CONTRAST TECHNIQUE: Contiguous axial images were obtained from the base of the skull through the vertex without intravenous contrast. COMPARISON:  None. FINDINGS: Brain: No evidence of acute infarction, hemorrhage, hydrocephalus, extra-axial collection or mass lesion/mass effect. Vascular: No hyperdense vessel or unexpected calcification. Skull: Normal. Negative for fracture or focal lesion. Sinuses/Orbits: No acute finding. Other: None. IMPRESSION: No acute intracranial abnormalities. Normal brain. Electronically Signed   By: Kerby Moors M.D.   On: 10/02/2021 13:23   MR Brain W and Wo Contrast  Result Date: 10/02/2021 CLINICAL DATA:  Three weeks of vision loss, concern for multiple sclerosis EXAM: MRI HEAD AND ORBITS WITHOUT AND WITH CONTRAST TECHNIQUE: Multiplanar, multiecho pulse sequences of the brain and surrounding structures were obtained without and with intravenous contrast. Multiplanar, multiecho pulse sequences of the orbits and surrounding structures were obtained including fat saturation techniques, before and after intravenous contrast administration. CONTRAST:  77m GADAVIST GADOBUTROL 1 MMOL/ML IV SOLN COMPARISON:  No prior MRI, correlation is made with CT head 10/02/2021 FINDINGS: MRI HEAD FINDINGS Brain: No acute infarction, hemorrhage, hydrocephalus, extra-axial collection or mass lesion. A few T2 hyperintense foci are noted in the bilateral periventricular white matter; no foci involving the corpus callosum, juxtacortical white matter, infratentorially, or in the  imaged cervical spine. No abnormal enhancement. Prominence of the right greater than left cerebellar tonsils, which extend up to 4 mm the on the foramen magnum. Vascular: Normal flow voids. Skull and upper cervical spine: Normal marrow signal. Other: The mastoids are well aerated. MRI ORBITS FINDINGS Evaluation is somewhat limited by motion artifact. Orbits: No traumatic or inflammatory finding. Globes, optic nerves, orbital fat, extraocular muscles, vascular structures, and lacrimal glands are normal. No definite abnormal enhancement. Visualized sinuses: Mucosal thickening in the left maxillary sinus. Soft tissues: Negative. IMPRESSION: 1. No acute intracranial process. Fall there are scattered T2 hyperintense foci in the bilateral periventricular white matter, these are not in a pattern suspicious for demyelinating disease and are favored to be the sequela of small vessel ischemic disease. 2. Evaluation the orbits is somewhat limited by motion artifact. Within this limitation, normal appearance of the orbits, without abnormal signal. Electronically Signed   By: Merilyn Baba M.D.   On: 10/02/2021 17:30   MR ORBITS W WO CONTRAST  Result Date: 10/02/2021 CLINICAL DATA:  Three weeks of vision loss, concern for multiple sclerosis EXAM: MRI HEAD AND ORBITS WITHOUT AND WITH CONTRAST TECHNIQUE: Multiplanar, multiecho pulse sequences of the brain and surrounding structures were obtained without and with intravenous contrast. Multiplanar, multiecho pulse sequences of the orbits and surrounding structures were obtained including fat saturation techniques, before and after intravenous contrast administration. CONTRAST:  1m GADAVIST GADOBUTROL 1 MMOL/ML IV SOLN COMPARISON:  No prior MRI, correlation is made with CT head 10/02/2021 FINDINGS: MRI HEAD FINDINGS Brain: No acute infarction, hemorrhage, hydrocephalus, extra-axial collection or mass lesion. A few T2 hyperintense foci are noted in the bilateral periventricular  white matter; no foci involving the corpus callosum, juxtacortical white matter, infratentorially, or in the imaged cervical spine. No abnormal enhancement. Prominence of the right greater than left cerebellar tonsils, which extend up to 4 mm the on the foramen magnum. Vascular: Normal flow voids. Skull and upper cervical spine: Normal marrow signal. Other: The mastoids are well aerated. MRI ORBITS FINDINGS Evaluation is somewhat limited by motion artifact. Orbits: No traumatic or inflammatory finding. Globes, optic nerves, orbital fat, extraocular muscles, vascular structures, and lacrimal glands are normal. No definite abnormal enhancement. Visualized sinuses: Mucosal thickening in the left maxillary sinus. Soft tissues: Negative. IMPRESSION: 1. No acute intracranial process. Fall there are scattered T2 hyperintense foci in the bilateral periventricular white matter, these are not in a pattern suspicious for demyelinating disease and are favored to be the sequela of small vessel ischemic disease. 2. Evaluation the orbits is somewhat limited by motion artifact. Within this limitation, normal appearance of the orbits, without abnormal signal. Electronically Signed   By: AMerilyn BabaM.D.   On: 10/02/2021 17:30        Scheduled Meds:  pantoprazole (PROTONIX) IV  40 mg Intravenous Q24H   senna-docusate  1 tablet Oral BID   Continuous Infusions:  methylPREDNISolone (SOLU-MEDROL) injection 1,000 mg (10/04/21 1012)     LOS: 2 days    Time spent: 36 minutes spent on chart review, discussion with nursing staff, consultants, updating family and interview/physical exam; more than 50% of that time was spent in counseling and/or coordination of care.  Kester Stimpson J [place] DO [facility] Available via Epic secure chat 7am-7pm After these hours, please refer to coverage provider listed on [email or web address] 10/04/2021, 11:57 AM

## 2021-10-04 NOTE — Progress Notes (Signed)
Bedside report performed with Fleet Contras who contacted Hospitalist for orders for glucose/insulin protocol orders. Administered 20 units of insulin per Hospitalist orders. Will recheck ACHS.  Patient asking for her ice pack to be rewarmed for her lumbar puncture site pain. Sent request to hospitalist for k-pad.

## 2021-10-04 NOTE — Progress Notes (Signed)
Received orders from NP Jon Billings to hold insulin and recheck glucose at midnight.

## 2021-10-04 NOTE — Progress Notes (Signed)
She reports slight improvement in vision today, significant no new symptoms. No evidence of infection on LP.   On exam, she has slight reaction to light today, improved from yesterday.   Will continue solumedrol 1g daily for 5 days, today is 2/5.  Ritta Slot, MD [facility] [phone]  If 7pm- 7am, please page neurology on call as listed in AMION.

## 2021-10-05 LAB — BASIC METABOLIC PANEL
Anion gap: 7 (ref 5–15)
BUN: 22 mg/dL — ABNORMAL HIGH (ref 6–20)
CO2: 29 mmol/L (ref 22–32)
Calcium: 9.7 mg/dL (ref 8.9–10.3)
Chloride: 96 mmol/L — ABNORMAL LOW (ref 98–111)
Creatinine, Ser: 0.63 mg/dL (ref 0.44–1.00)
GFR, Estimated: >60 mL/min (ref >60)
Glucose, Bld: 300 mg/dL — ABNORMAL HIGH (ref 70–99)
Potassium: 4.3 mmol/L (ref 3.5–5.1)
Sodium: 132 mmol/L — ABNORMAL LOW (ref 135–145)

## 2021-10-05 LAB — GLUCOSE, CAPILLARY
Glucose-Capillary: 237 mg/dL — ABNORMAL HIGH (ref 70–99)
Glucose-Capillary: 256 mg/dL — ABNORMAL HIGH (ref 70–99)
Glucose-Capillary: 263 mg/dL — ABNORMAL HIGH (ref 70–99)
Glucose-Capillary: 277 mg/dL — ABNORMAL HIGH (ref 70–99)
Glucose-Capillary: 345 mg/dL — ABNORMAL HIGH (ref 70–99)
Glucose-Capillary: 356 mg/dL — ABNORMAL HIGH (ref 70–99)

## 2021-10-05 MED ORDER — NAPHAZOLINE-GLYCERIN 0.012-0.25 % OP SOLN
1.0000 [drp] | Freq: Four times a day (QID) | OPHTHALMIC | Status: DC | PRN
  Administered 2021-10-05 – 2021-10-06 (×2): 2 [drp] via OPHTHALMIC
  Administered 2021-10-06 – 2021-10-07 (×2): 1 [drp] via OPHTHALMIC
  Filled 2021-10-05: qty 15

## 2021-10-05 MED ORDER — METHOCARBAMOL 500 MG PO TABS
500.0000 mg | ORAL_TABLET | Freq: Four times a day (QID) | ORAL | Status: DC | PRN
  Administered 2021-10-05 (×2): 500 mg via ORAL
  Filled 2021-10-05 (×2): qty 1

## 2021-10-05 MED ORDER — INSULIN GLARGINE-YFGN 100 UNIT/ML ~~LOC~~ SOLN
10.0000 [IU] | Freq: Every day | SUBCUTANEOUS | Status: DC
  Administered 2021-10-05: 10 [IU] via SUBCUTANEOUS
  Filled 2021-10-05 (×2): qty 0.1

## 2021-10-05 NOTE — Progress Notes (Signed)
PROGRESS NOTE    Regina Short  LPF:[id number] DOB: [birth year] DOA: 10/02/2021 PCP: Mylinda Latina, PA-C    Brief Narrative:  Regina Short is a 60 year old female with past medical history significant for type 2 diabetes mellitus, essential hypertension, history of headaches who presented to [facility] ED on 10/12 with 3-week history of blurred vision with associated left eye pain of fluctuating intensity.  Now patient continued to have similar symptoms in her right eye and was seen by ophthalmology, Dr. Edison Pace on the day of arrival who found no significant abnormal findings.  Given her persistence of symptoms and now affecting her right side, patient presented to ED for further evaluation.  In the ED, temperature 98.0 F, HR 73, RR 18, BP 122/77, SPO2 98% on room air.  Sodium 136, potassium 4.5, chloride 99, CO2 25, BUN 16, creatinine 0.60, glucose 218, AST 13, ALT 13, total bilirubin 0.4.  WBC 8.6, hemoglobin 13.5, platelets 248.  CRP less than 0.5.  ESR 5.  COVID-19 PCR negative.  Influenza A/B PCR negative.  CT head without contrast with no acute intracranial normalities.  MR brain with and without contrast with no acute infarction, no hemorrhage, no hydrocephalus no extra-axial collection or mass lesion; a few T2 hyperintense focus I noted bilateral periventricular white matter with no foci involving corpus callosum, juxtacortical white matter or in the imaged C-spine, no abnormal enhancement; not consistent with demyelinating disease pattern.  MR orbits limited by motion but appears normal without abnormal signal.  Neurology was consulted.  Patient was started on Compazine and Depakote.  TRH consulted for further evaluation management of progressive headache with associated visual disturbance concerning for complicated migraine versus status migrainosus.   Assessment & Plan:   Principal Problem:   Complicated migraine with status migrainosus Active Problems:   Essential hypertension   Type 2  diabetes mellitus with hyperglycemia, without long-term current use of insulin (HCC)   Vision disturbance   Headache with continued visual disturbance concern for optic neuritis vs complicated migraine Patient presenting to the ED following recent ophthalmology evaluation that was reported normal due to 3-week history of progressive headache associated with left eye visual disturbance.  Now affecting her right eye.  MR brain/orbits unrevealing and not consistent with a demyelinating process.  ESR/CRP unrevealing.  Patient is afebrile without leukocytosis.  HIV nonreactive.  Patient underwent lumbar puncture by neurology on 10/03/2021.  CSF cell count, glucose and protein unrevealing. NMO ab and ANA negative. RPR non-reactive.  Angiotensin-converting enzyme within normal limits. --Neurology following, appreciate assistance --This morning, patient reports headache resolved and left eye vision improved but continues with right eye blurred vision --CSF IgG, oligoclonal bands CSF VDRL, cytology: Pending --Continues on Solu-Medrol 1 g IV daily x5 days  Type 2 diabetes mellitus Hemoglobin A1c 7.7 on 08/09/2021.  Home medicines include glyburide-metformin 5-500 mg Home regimen includes glyburide-metformin and Jardiance.  Patient with significant hyperglycemia while inpatient, likely steroid effect from high-dose IV Solu-Medrol. --Hold oral hypoglycemics while inpatient. --Semglee 10 units subcutaneously daily --Resistant SSI for coverage --CBGs qAC/HS  Essential hypertension BP 122/61 this morning, well controlled.  On benazepril 10 mg p.o. daily at home. --Hold home antihypertensives given adequate BP control currently --Continue monitor BP closely and will restart if needed   DVT prophylaxis: SCDs Start: 10/02/21 2212   Code Status: Full Code Family Communication: No family present at bedside this morning.  Disposition Plan:  Level of care: Med-Surg Status is: Inpatient  Remains inpatient  appropriate because: Awaiting  multiple labs, continues on IV steroids per neurology.    Consultants:  Neurology, Dr. Leonel Ramsay  Procedures:  Lumbar puncture: 10/13  Antimicrobials:  None   Subjective: Patient seen examined bedside, resting comfortably. Continues with right eye blurred vision with "grayed appearance"; slightly improved since admission but similar from yesterday.   Continues on IV Solu-Medrol per neurology x 5 days.  Patient's glucose has now been elevated likely steroid effect from Solu-Medrol.  No other questions or concerns at this time. Denies headache, no abdominal pain, no chest pain, no palpitations, no shortness of breath, no fever/chills/night sweats, no nausea/vomiting/diarrhea, no paresthesias.  No other acute events overnight per nursing staff other than elevated glucose.  Objective: Vitals:   10/04/21 0846 10/04/21 2117 10/05/21 0438 10/05/21 0732  BP: 121/73 117/71 122/61 135/80  Pulse: 64 (!) 59 (!) 54 (!) 54  Resp: '18 20 18 12  ' Temp: 97.8 F (36.6 C) 98.2 F (36.8 C) 98.4 F (36.9 C) 98.4 F (36.9 C)  TempSrc: Oral Oral Oral Oral  SpO2: 100% 97% 97% 99%  Weight:      Height:       No intake or output data in the 24 hours ending 10/05/21 0916  Filed Weights   10/02/21 1226  Weight: 63 kg    Examination:  General exam: Appears calm and comfortable  Respiratory system: Clear to auscultation. Respiratory effort normal.  On room air Cardiovascular system: S1 & S2 heard, RRR. No JVD, murmurs, rubs, gallops or clicks. No pedal edema. Gastrointestinal system: Abdomen is nondistended, soft and nontender. No organomegaly or masses felt. Normal bowel sounds heard. Central nervous system: Alert and oriented. Afferent pupil defect OD with min reaction Extremities: Symmetric 5 x 5 power. Skin: No rashes, lesions or ulcers Psychiatry: Judgement and insight appear normal. Mood & affect appropriate.     Data Reviewed: I have personally reviewed  following labs and imaging studies  CBC: Recent Labs  Lab 10/02/21 1241  WBC 8.6  NEUTROABS 5.8  HGB 13.5  HCT 39.9  MCV 80.4  PLT 836   Basic Metabolic Panel: Recent Labs  Lab 10/02/21 1241 10/04/21 1910  NA 136 128*  K 4.5 5.6*  CL 99 92*  CO2 25 24  GLUCOSE 218* 870*  BUN 16 27*  CREATININE 0.60 1.04*  CALCIUM 9.9 9.5   GFR: Estimated Creatinine Clearance: 53.9 mL/min (A) (by C-G formula based on SCr of 1.04 mg/dL (H)). Liver Function Tests: Recent Labs  Lab 10/02/21 1241  AST 13*  ALT 13  ALKPHOS 80  BILITOT 0.4  PROT 7.2  ALBUMIN 3.9   No results for input(s): LIPASE, AMYLASE in the last 168 hours. No results for input(s): AMMONIA in the last 168 hours. Coagulation Profile: Recent Labs  Lab 10/02/21 1241  INR 0.9   Cardiac Enzymes: No results for input(s): CKTOTAL, CKMB, CKMBINDEX, TROPONINI in the last 168 hours. BNP (last 3 results) No results for input(s): PROBNP in the last 8760 hours. HbA1C: No results for input(s): HGBA1C in the last 72 hours. CBG: Recent Labs  Lab 10/04/21 1840 10/04/21 2113 10/05/21 0007 10/05/21 0142 10/05/21 0734  GLUCAP >600* 518* 263* 237* 277*   Lipid Profile: No results for input(s): CHOL, HDL, LDLCALC, TRIG, CHOLHDL, LDLDIRECT in the last 72 hours. Thyroid Function Tests: No results for input(s): TSH, T4TOTAL, FREET4, T3FREE, THYROIDAB in the last 72 hours. Anemia Panel: No results for input(s): VITAMINB12, FOLATE, FERRITIN, TIBC, IRON, RETICCTPCT in the last 72 hours. Sepsis Labs: No results  for input(s): PROCALCITON, LATICACIDVEN in the last 168 hours.  Recent Results (from the past 240 hour(s))  Resp Panel by RT-PCR (Flu A&B, Covid) Nasopharyngeal Swab     Status: None   Collection Time: 10/02/21  3:02 PM   Specimen: Nasopharyngeal Swab; Nasopharyngeal(NP) swabs in vial transport medium  Result Value Ref Range Status   SARS Coronavirus 2 by RT PCR NEGATIVE NEGATIVE Final    Comment:  (NOTE) SARS-CoV-2 target nucleic acids are NOT DETECTED.  The SARS-CoV-2 RNA is generally detectable in upper respiratory specimens during the acute phase of infection. The lowest concentration of SARS-CoV-2 viral copies this assay can detect is 138 copies/mL. A negative result does not preclude SARS-Cov-2 infection and should not be used as the sole basis for treatment or other patient management decisions. A negative result may occur with  improper specimen collection/handling, submission of specimen other than nasopharyngeal swab, presence of viral mutation(s) within the areas targeted by this assay, and inadequate number of viral copies(<138 copies/mL). A negative result must be combined with clinical observations, patient history, and epidemiological information. The expected result is Negative.  Fact Sheet for Patients:  EntrepreneurPulse.com.au  Fact Sheet for Healthcare Providers:  IncredibleEmployment.be  This test is no t yet approved or cleared by the [place] FDA and  has been authorized for detection and/or diagnosis of SARS-CoV-2 by FDA under an Emergency Use Authorization (EUA). This EUA will remain  in effect (meaning this test can be used) for the duration of the COVID-19 declaration under Section 564(b)(1) of the Act, 21 U.S.C.section 360bbb-3(b)(1), unless the authorization is terminated  or revoked sooner.       Influenza A by PCR NEGATIVE NEGATIVE Final   Influenza B by PCR NEGATIVE NEGATIVE Final    Comment: (NOTE) The Xpert Xpress SARS-CoV-2/FLU/RSV plus assay is intended as an aid in the diagnosis of influenza from Nasopharyngeal swab specimens and should not be used as a sole basis for treatment. Nasal washings and aspirates are unacceptable for Xpert Xpress SARS-CoV-2/FLU/RSV testing.  Fact Sheet for Patients: EntrepreneurPulse.com.au  Fact Sheet for Healthcare  Providers: IncredibleEmployment.be  This test is not yet approved or cleared by the [place] FDA and has been authorized for detection and/or diagnosis of SARS-CoV-2 by FDA under an Emergency Use Authorization (EUA). This EUA will remain in effect (meaning this test can be used) for the duration of the COVID-19 declaration under Section 564(b)(1) of the Act, 21 U.S.C. section 360bbb-3(b)(1), unless the authorization is terminated or revoked.  Performed at [facility], [address], [place], [place] [address]          Radiology Studies: No results found.      Scheduled Meds:  insulin aspart  0-20 Units Subcutaneous TID WC   insulin aspart  0-5 Units Subcutaneous QHS   insulin glargine-yfgn  10 Units Subcutaneous Daily   pantoprazole (PROTONIX) IV  40 mg Intravenous Q24H   senna-docusate  1 tablet Oral BID   Continuous Infusions:  methylPREDNISolone (SOLU-MEDROL) injection Stopped (10/04/21 1112)     LOS: 3 days    Time spent: 36 minutes spent on chart review, discussion with nursing staff, consultants, updating family and interview/physical exam; more than 50% of that time was spent in counseling and/or coordination of care.    Regina Short [place] DO [facility] Available via Epic secure chat 7am-7pm After these hours, please refer to coverage provider listed on [email or web address] 10/05/2021, 9:16 AM

## 2021-10-06 LAB — BASIC METABOLIC PANEL
Anion gap: 8 (ref 5–15)
BUN: 28 mg/dL — ABNORMAL HIGH (ref 6–20)
CO2: 27 mmol/L (ref 22–32)
Calcium: 9.3 mg/dL (ref 8.9–10.3)
Chloride: 96 mmol/L — ABNORMAL LOW (ref 98–111)
Creatinine, Ser: 1.12 mg/dL — ABNORMAL HIGH (ref 0.44–1.00)
GFR, Estimated: 56 mL/min — ABNORMAL LOW (ref >60)
Glucose, Bld: 476 mg/dL — ABNORMAL HIGH (ref 70–99)
Potassium: 4.9 mmol/L (ref 3.5–5.1)
Sodium: 131 mmol/L — ABNORMAL LOW (ref 135–145)

## 2021-10-06 LAB — GLUCOSE, CAPILLARY
Glucose-Capillary: 222 mg/dL — ABNORMAL HIGH (ref 70–99)
Glucose-Capillary: 360 mg/dL — ABNORMAL HIGH (ref 70–99)
Glucose-Capillary: 439 mg/dL — ABNORMAL HIGH (ref 70–99)
Glucose-Capillary: 351 mg/dL — ABNORMAL HIGH (ref 70–99)

## 2021-10-06 MED ORDER — INSULIN ASPART 100 UNIT/ML IJ SOLN
20.0000 [IU] | Freq: Once | INTRAMUSCULAR | Status: AC
  Administered 2021-10-06: 20 [IU] via SUBCUTANEOUS

## 2021-10-06 MED ORDER — INSULIN GLARGINE-YFGN 100 UNIT/ML ~~LOC~~ SOLN
25.0000 [IU] | Freq: Every day | SUBCUTANEOUS | Status: DC
  Administered 2021-10-06: 25 [IU] via SUBCUTANEOUS
  Filled 2021-10-06 (×2): qty 0.25

## 2021-10-06 NOTE — Progress Notes (Signed)
PROGRESS NOTE    Regina Short  ZOX:[id number] DOB: November 13, 1961 DOA: 10/02/2021 PCP: Mylinda Latina, PA-C    Brief Narrative:  Regina Short is a 60 year old female with past medical history significant for type 2 diabetes mellitus, essential hypertension, history of headaches who presented to [facility] ED on 10/12 with 3-week history of blurred vision with associated left eye pain of fluctuating intensity.  Now patient continued to have similar symptoms in her right eye and was seen by ophthalmology, Dr. Edison Pace on the day of arrival who found no significant abnormal findings.  Given her persistence of symptoms and now affecting her right side, patient presented to ED for further evaluation.  In the ED, temperature 98.0 F, HR 73, RR 18, BP 122/77, SPO2 98% on room air.  Sodium 136, potassium 4.5, chloride 99, CO2 25, BUN 16, creatinine 0.60, glucose 218, AST 13, ALT 13, total bilirubin 0.4.  WBC 8.6, hemoglobin 13.5, platelets 248.  CRP less than 0.5.  ESR 5.  COVID-19 PCR negative.  Influenza A/B PCR negative.  CT head without contrast with no acute intracranial normalities.  MR brain with and without contrast with no acute infarction, no hemorrhage, no hydrocephalus no extra-axial collection or mass lesion; a few T2 hyperintense focus I noted bilateral periventricular white matter with no foci involving corpus callosum, juxtacortical white matter or in the imaged C-spine, no abnormal enhancement; not consistent with demyelinating disease pattern.  MR orbits limited by motion but appears normal without abnormal signal.  Neurology was consulted.  Patient was started on Compazine and Depakote.  TRH consulted for further evaluation management of progressive headache with associated visual disturbance concerning for complicated migraine versus status migrainosus.   Assessment & Plan:   Principal Problem:   Complicated migraine with status migrainosus Active Problems:   Essential hypertension   Type 2  diabetes mellitus with hyperglycemia, without long-term current use of insulin (HCC)   Vision disturbance   Headache with continued visual disturbance concern for optic neuritis vs complicated migraine Patient presenting to the ED following recent ophthalmology evaluation that was reported normal due to 3-week history of progressive headache associated with left eye visual disturbance.  Now affecting her right eye.  MR brain/orbits unrevealing and not consistent with a demyelinating process.  ESR/CRP unrevealing.  Patient is afebrile without leukocytosis.  HIV nonreactive.  Patient underwent lumbar puncture by neurology on 10/03/2021.  CSF cell count, glucose and protein unrevealing. NMO ab and ANA negative. RPR non-reactive.  Angiotensin-converting enzyme within normal limits. --Neurology following, appreciate assistance --CSF IgG, oligoclonal bands CSF VDRL, ACE, cytology: Pending --Continue Solu-Medrol 1 g IV daily x 5 days  Type 2 diabetes mellitus Hemoglobin A1c 7.7 on 08/09/2021.  Home medicines include glyburide-metformin 5-500 mg Home regimen includes glyburide-metformin and Jardiance.  Patient with significant hyperglycemia while inpatient, likely steroid effect from high-dose IV Solu-Medrol. --Hold oral hypoglycemics while inpatient. --Increase Semglee to 25 units subcutaneously daily --Resistant SSI for coverage --CBGs qAC/HS  Essential hypertension BP 120/60 this morning, well controlled.  On benazepril 10 mg p.o. daily at home. --Hold home antihypertensives given adequate BP control currently --Continue monitor BP closely and will restart if needed   DVT prophylaxis: SCDs Start: 10/02/21 2212   Code Status: Full Code Family Communication: No family present at bedside this morning.  Disposition Plan:  Level of care: Med-Surg Status is: Inpatient  Remains inpatient appropriate because: Awaiting multiple labs, continues on IV steroids per neurology.    Consultants:   Neurology, Dr.  Kirkpatrick  Procedures:  Lumbar puncture: 10/13  Antimicrobials:  None   Subjective: Patient seen examined bedside, resting comfortably. Continues with right eye blurred vision slowly improving since yesterday.  Right now pain-free.  Left eye with normal vision.  Continues on IV Solu-Medrol per neurology x 5 days.  No other questions or concerns at this time. Denies headache, no abdominal pain, no chest pain, no palpitations, no shortness of breath, no fever/chills/night sweats, no nausea/vomiting/diarrhea, no paresthesias.  No other acute events overnight per nursing staff other than elevated glucose.  Objective: Vitals:   10/05/21 0438 10/05/21 0732 10/05/21 2011 10/06/21 0357  BP: 122/61 135/80 132/75 126/60  Pulse: (!) 54 (!) 54 70 (!) 57  Resp: 18 16    Temp: 98.4 F (36.9 C) 98.4 F (36.9 C) 98.4 F (36.9 C) 98.4 F (36.9 C)  TempSrc: Oral Oral    SpO2: 97% 99% 99% 99%  Weight:      Height:        Intake/Output Summary (Last 24 hours) at 10/06/2021 0915 Last data filed at 10/05/2021 1042 Gross per 24 hour  Intake 240 ml  Output --  Net 240 ml    Filed Weights   10/02/21 1226  Weight: 63 kg    Examination:  General exam: Appears calm and comfortable  Respiratory system: Clear to auscultation. Respiratory effort normal.  On room air Cardiovascular system: S1 & S2 heard, RRR. No JVD, murmurs, rubs, gallops or clicks. No pedal edema. Gastrointestinal system: Abdomen is nondistended, soft and nontender. No organomegaly or masses felt. Normal bowel sounds heard. Central nervous system: Alert and oriented. Afferent pupil defect OD with slight reaction Extremities: Symmetric 5 x 5 power. Skin: No rashes, lesions or ulcers Psychiatry: Judgement and insight appear normal. Mood & affect appropriate.     Data Reviewed: I have personally reviewed following labs and imaging studies  CBC: Recent Labs  Lab 10/02/21 1241  WBC 8.6  NEUTROABS 5.8   HGB 13.5  HCT 39.9  MCV 80.4  PLT 388   Basic Metabolic Panel: Recent Labs  Lab 10/02/21 1241 10/04/21 1910 10/05/21 1126  NA 136 128* 132*  K 4.5 5.6* 4.3  CL 99 92* 96*  CO2 _0 GLUCOSE 218* 870* 300*  BUN 16 27* 22*  CREATININE 0.60 1.04* 0.63  CALCIUM 9.9 9.5 9.7   GFR: Estimated Creatinine Clearance: 70 mL/min (by C-G formula based on SCr of 0.63 mg/dL). Liver Function Tests: Recent Labs  Lab 10/02/21 1241  AST 13*  ALT 13  ALKPHOS 80  BILITOT 0.4  PROT 7.2  ALBUMIN 3.9   No results for input(s): LIPASE, AMYLASE in the last 168 hours. No results for input(s): AMMONIA in the last 168 hours. Coagulation Profile: Recent Labs  Lab 10/02/21 1241  INR 0.9   Cardiac Enzymes: No results for input(s): CKTOTAL, CKMB, CKMBINDEX, TROPONINI in the last 168 hours. BNP (last 3 results) No results for input(s): PROBNP in the last 8760 hours. HbA1C: No results for input(s): HGBA1C in the last 72 hours. CBG: Recent Labs  Lab 10/05/21 0734 10/05/21 1146 10/05/21 1748 10/05/21 2133 10/06/21 0729  GLUCAP 277* 256* 356* 345* 222*   Lipid Profile: No results for input(s): CHOL, HDL, LDLCALC, TRIG, CHOLHDL, LDLDIRECT in the last 72 hours. Thyroid Function Tests: No results for input(s): TSH, T4TOTAL, FREET4, T3FREE, THYROIDAB in the last 72 hours. Anemia Panel: No results for input(s): VITAMINB12, FOLATE, FERRITIN, TIBC, IRON, RETICCTPCT in the last 72 hours. Sepsis  Labs: No results for input(s): PROCALCITON, LATICACIDVEN in the last 168 hours.  Recent Results (from the past 240 hour(s))  Resp Panel by RT-PCR (Flu A&B, Covid) Nasopharyngeal Swab     Status: None   Collection Time: 10/02/21  3:02 PM   Specimen: Nasopharyngeal Swab; Nasopharyngeal(NP) swabs in vial transport medium  Result Value Ref Range Status   SARS Coronavirus 2 by RT PCR NEGATIVE NEGATIVE Final    Comment: (NOTE) SARS-CoV-2 target nucleic acids are NOT DETECTED.  The SARS-CoV-2 RNA  is generally detectable in upper respiratory specimens during the acute phase of infection. The lowest concentration of SARS-CoV-2 viral copies this assay can detect is 138 copies/mL. A negative result does not preclude SARS-Cov-2 infection and should not be used as the sole basis for treatment or other patient management decisions. A negative result may occur with  improper specimen collection/handling, submission of specimen other than nasopharyngeal swab, presence of viral mutation(s) within the areas targeted by this assay, and inadequate number of viral copies(<138 copies/mL). A negative result must be combined with clinical observations, patient history, and epidemiological information. The expected result is Negative.  Fact Sheet for Patients:  EntrepreneurPulse.com.au  Fact Sheet for Healthcare Providers:  IncredibleEmployment.be  This test is no t yet approved or cleared by the [place] FDA and  has been authorized for detection and/or diagnosis of SARS-CoV-2 by FDA under an Emergency Use Authorization (EUA). This EUA will remain  in effect (meaning this test can be used) for the duration of the COVID-19 declaration under Section 564(b)(1) of the Act, 21 U.S.C.section 360bbb-3(b)(1), unless the authorization is terminated  or revoked sooner.       Influenza A by PCR NEGATIVE NEGATIVE Final   Influenza B by PCR NEGATIVE NEGATIVE Final    Comment: (NOTE) The Xpert Xpress SARS-CoV-2/FLU/RSV plus assay is intended as an aid in the diagnosis of influenza from Nasopharyngeal swab specimens and should not be used as a sole basis for treatment. Nasal washings and aspirates are unacceptable for Xpert Xpress SARS-CoV-2/FLU/RSV testing.  Fact Sheet for Patients: EntrepreneurPulse.com.au  Fact Sheet for Healthcare Providers: IncredibleEmployment.be  This test is not yet approved or cleared by the  [place] FDA and has been authorized for detection and/or diagnosis of SARS-CoV-2 by FDA under an Emergency Use Authorization (EUA). This EUA will remain in effect (meaning this test can be used) for the duration of the COVID-19 declaration under Section 564(b)(1) of the Act, 21 U.S.C. section 360bbb-3(b)(1), unless the authorization is terminated or revoked.  Performed at [facility], [address], [place], [place] [address]          Radiology Studies: No results found.      Scheduled Meds:  insulin aspart  0-20 Units Subcutaneous TID WC   insulin aspart  0-5 Units Subcutaneous QHS   insulin glargine-yfgn  25 Units Subcutaneous Daily   pantoprazole (PROTONIX) IV  40 mg Intravenous Q24H   senna-docusate  1 tablet Oral BID   Continuous Infusions:  methylPREDNISolone (SOLU-MEDROL) injection Stopped (10/05/21 1127)     LOS: 4 days    Time spent: 36 minutes spent on chart review, discussion with nursing staff, consultants, updating family and interview/physical exam; more than 50% of that time was spent in counseling and/or coordination of care.    Layloni Fahrner J [place] DO [facility] Available via Epic secure chat 7am-7pm After these hours, please refer to coverage provider listed on [email or web address] 10/06/2021, 9:15 AM

## 2021-10-06 NOTE — Progress Notes (Signed)
Subjective: Continues to have improvement in the right eye.   Exam: Vitals:   10/05/21 2011 10/06/21 0357  BP: 132/75 126/60  Pulse: 70 (!) 57  Resp:    Temp: 98.4 F (36.9 C) 98.4 F (36.9 C)  SpO2: 99% 99%   Gen: In bed, NAD Resp: non-labored breathing, no acute distress Abd: soft, nt  Neuro: MS: awake, alert, interactive and appropriate CN: EOMI, VFF in left eye. In the right eye, she is able to see hand waving, but not count fingers.  Motor: 5/5 throughout Sensory:intact to LT  Pertinent Labs: ESR 5 CRP < 0.5  NMO - negative ANA - negative RPR - negative ACE negative  CSF WBC 5 CSF RBC 0 CSF protein 27 CSF glucose 85 CSF VDRL - pending CSF ACE - pending CSF IGG index - pending   Impression: 60 yo F with progressive three week course of LEFT eye vision blurring starting in late September, with subsequent improvement(prior to steroids) and subsequent R painful vision loss with afferent pupillary defect and normal exam and imaging. Her IOP was normal, and no other signs of acute glaucoma. With this presentation, I think that optic neuritis continues to be the most likely diagnosis, and she is improving with steroids.   Recommendations: 1) Finish solumedrol, day 4/5 today.  2) After solumedrol, would do rapid taper, 57m prednisone x 1 day, then taper by 141mday for a 5-day taper.  3) I would favor outpatient follow up with a neuro-ophthamologist given the unusual history, though could also follow up with neurology here locally if not able to get in.  4) Neurology will be available as needed.   McRoland RackMD [facility] [phone] 7pm- 7am, please page neurology on call as listed in [facility]

## 2021-10-07 ENCOUNTER — Other Ambulatory Visit: Payer: Self-pay | Admitting: Neurology

## 2021-10-07 LAB — IGG CSF INDEX
Albumin CSF-mCnc: 18 mg/dL (ref 8–37)
Albumin: 4.2 g/dL (ref 3.8–4.9)
CSF IgG Index: 0.5 (ref 0.0–0.7)
IgG (Immunoglobin G), Serum: 643 mg/dL (ref 586–1602)
IgG, CSF: 1.3 mg/dL (ref 0.0–6.7)
IgG/Alb Ratio, CSF: 0.07 (ref 0.00–0.25)

## 2021-10-07 LAB — GLUCOSE, CAPILLARY
Glucose-Capillary: 150 mg/dL — ABNORMAL HIGH (ref 70–99)
Glucose-Capillary: 290 mg/dL — ABNORMAL HIGH (ref 70–99)
Glucose-Capillary: 149 mg/dL — ABNORMAL HIGH (ref 70–99)
Glucose-Capillary: 341 mg/dL — ABNORMAL HIGH (ref 70–99)

## 2021-10-07 LAB — VDRL, CSF: VDRL Quant, CSF: NONREACTIVE

## 2021-10-07 LAB — CYTOLOGY - NON PAP

## 2021-10-07 MED ORDER — ENOXAPARIN SODIUM 40 MG/0.4ML IJ SOSY
40.0000 mg | PREFILLED_SYRINGE | INTRAMUSCULAR | Status: DC
  Administered 2021-10-07: 40 mg via SUBCUTANEOUS
  Filled 2021-10-07: qty 0.4

## 2021-10-07 MED ORDER — INSULIN GLARGINE-YFGN 100 UNIT/ML ~~LOC~~ SOLN
40.0000 [IU] | Freq: Every day | SUBCUTANEOUS | Status: DC
  Administered 2021-10-07: 40 [IU] via SUBCUTANEOUS
  Filled 2021-10-07 (×2): qty 0.4

## 2021-10-07 MED ORDER — PANTOPRAZOLE SODIUM 40 MG IV SOLR
40.0000 mg | Freq: Once | INTRAVENOUS | Status: AC
  Administered 2021-10-07: 40 mg via INTRAVENOUS
  Filled 2021-10-07: qty 40

## 2021-10-07 MED ORDER — METHYLPREDNISOLONE SODIUM SUCC 1000 MG IJ SOLR
1000.0000 mg | INTRAMUSCULAR | Status: AC
  Administered 2021-10-07: 1000 mg via INTRAVENOUS
  Filled 2021-10-07: qty 16

## 2021-10-07 MED ORDER — INSULIN ASPART 100 UNIT/ML IJ SOLN
10.0000 [IU] | Freq: Three times a day (TID) | INTRAMUSCULAR | Status: DC
  Administered 2021-10-07 (×3): 10 [IU] via SUBCUTANEOUS
  Filled 2021-10-07 (×2): qty 1

## 2021-10-07 NOTE — Progress Notes (Signed)
PROGRESS NOTE    Regina Short  PIR:[id number] DOB: 02-24-1961 DOA: 10/02/2021 PCP: Mylinda Latina, PA-C    Brief Narrative:  Regina Short is a 60 year old female with past medical history significant for type 2 diabetes mellitus, essential hypertension, history of headaches who presented to [facility] ED on 10/12 with 3-week history of blurred vision with associated left eye pain of fluctuating intensity.  Now patient continued to have similar symptoms in her right eye and was seen by ophthalmology, Dr. Edison Pace on the day of arrival who found no significant abnormal findings.  Given her persistence of symptoms and now affecting her right side, patient presented to ED for further evaluation.  In the ED, temperature 98.0 F, HR 73, RR 18, BP 122/77, SPO2 98% on room air.  Sodium 136, potassium 4.5, chloride 99, CO2 25, BUN 16, creatinine 0.60, glucose 218, AST 13, ALT 13, total bilirubin 0.4.  WBC 8.6, hemoglobin 13.5, platelets 248.  CRP less than 0.5.  ESR 5.  COVID-19 PCR negative.  Influenza A/B PCR negative.  CT head without contrast with no acute intracranial normalities.  MR brain with and without contrast with no acute infarction, no hemorrhage, no hydrocephalus no extra-axial collection or mass lesion; a few T2 hyperintense focus I noted bilateral periventricular white matter with no foci involving corpus callosum, juxtacortical white matter or in the imaged C-spine, no abnormal enhancement; not consistent with demyelinating disease pattern.  MR orbits limited by motion but appears normal without abnormal signal.  Neurology was consulted.  Patient was started on Compazine and Depakote.  TRH consulted for further evaluation management of progressive headache with associated visual disturbance concerning for complicated migraine versus status migrainosus.   Assessment & Plan:   Principal Problem:   Complicated migraine with status migrainosus Active Problems:   Essential hypertension   Type 2  diabetes mellitus with hyperglycemia, without long-term current use of insulin (HCC)   Vision disturbance   Headache with continued visual disturbance concern for optic neuritis vs complicated migraine Patient presenting to the ED following recent ophthalmology evaluation that was reported normal due to 3-week history of progressive headache associated with left eye visual disturbance.  Now affecting her right eye.  MR brain/orbits unrevealing and not consistent with a demyelinating process.  ESR/CRP unrevealing.  Patient is afebrile without leukocytosis.  HIV nonreactive.  Patient underwent lumbar puncture by neurology on 10/03/2021.  CSF cell count, glucose and protein unrevealing. NMO ab and ANA negative. RPR non-reactive.  Angiotensin-converting enzyme within normal limits. --Neurology following, appreciate assistance --CSF IgG, Anti-Myelin Assoc Glycoprotein IgG, oligoclonal bands CSF VDRL, ACE, cytology: Pending --Continue Solu-Medrol 1 g IV daily x 5 days (day 5/5 today) --plan to start prednisone 42m daily on 10/18 w/ 195mtaper daily for 5 days --[facility] per neurology recommendations, nursing to fax hospital notes to clinic today at ([phone] 2 diabetes mellitus Hemoglobin A1c 7.7 on 08/09/2021.  Home medicines include glyburide-metformin 5-500 mg Home regimen includes glyburide-metformin and Jardiance.  Patient with significant hyperglycemia while inpatient, likely steroid effect from high-dose IV Solu-Medrol. --Hold oral hypoglycemics while inpatient. --Increase Semglee to 40 units subcutaneously daily --Novolog 10u TIDAC --Resistant SSI for coverage --CBGs qAC/HS  Essential hypertension On benazepril 10 mg p.o. daily at home. --Hold home antihypertensives given adequate BP control currently --Continue monitor BP closely and will restart if needed   DVT prophylaxis: SCDs Start: 10/02/21 2212   Code Status: Full  Code Family Communication:  No family present at bedside this morning.  Disposition Plan:  Level of care: Med-Surg Status is: Inpatient  Remains inpatient appropriate because: Awaiting multiple labs, continues on IV steroids per neurology.    Consultants:  Neurology, Dr. Leonel Ramsay  Procedures:  Lumbar puncture: 10/13  Antimicrobials:  None   Subjective: Patient seen examined bedside, resting comfortably. Continues with right eye blurred vision, roughly the same as yesterday; but continues to be pain-free.  Left eye with normal vision.  Continues on IV Solu-Medrol per neurology x 5 days; to complete today with start of prednisone taper tomorrow.  No other questions or concerns at this time. Denies headache, no abdominal pain, no chest pain, no palpitations, no shortness of breath, no fever/chills/night sweats, no nausea/vomiting/diarrhea, no paresthesias.  No other acute events overnight per nursing staff other than elevated glucose related to IV steroids.  Objective: Vitals:   10/06/21 1940 10/07/21 0443 10/07/21 0800 10/07/21 0846  BP: 118/72 (!) 153/73  (!) 141/71  Pulse: 75 (!) 51  (!) 57  Resp: '16 20 16 16  ' Temp: 98.4 F (36.9 C) 98.7 F (37.1 C)  98.7 F (37.1 C)  TempSrc:  Oral Oral Oral  SpO2: 99% 100% 100% 100%  Weight:      Height:        Intake/Output Summary (Last 24 hours) at 10/07/2021 1002 Last data filed at 10/07/2021 0535 Gross per 24 hour  Intake 240 ml  Output 0 ml  Net 240 ml    Filed Weights   10/02/21 1226  Weight: 63 kg    Examination:  General exam: Appears calm and comfortable  Respiratory system: Clear to auscultation. Respiratory effort normal.  On room air Cardiovascular system: S1 & S2 heard, RRR. No JVD, murmurs, rubs, gallops or clicks. No pedal edema. Gastrointestinal system: Abdomen is nondistended, soft and nontender. No organomegaly or masses felt. Normal bowel sounds heard. Central nervous system: Alert and oriented.  Afferent pupil defect OD with slight reaction Extremities: Symmetric 5 x 5 power. Skin: No rashes, lesions or ulcers Psychiatry: Judgement and insight appear normal. Mood & affect appropriate.     Data Reviewed: I have personally reviewed following labs and imaging studies  CBC: Recent Labs  Lab 10/02/21 1241  WBC 8.6  NEUTROABS 5.8  HGB 13.5  HCT 39.9  MCV 80.4  PLT 297   Basic Metabolic Panel: Recent Labs  Lab 10/02/21 1241 10/04/21 1910 10/05/21 1126 10/06/21 1724  NA 136 128* 132* 131*  K 4.5 5.6* 4.3 4.9  CL 99 92* 96* 96*  CO2 '25 24 29 27  ' GLUCOSE 218* 870* 300* 476*  BUN 16 27* 22* 28*  CREATININE 0.60 1.04* 0.63 1.12*  CALCIUM 9.9 9.5 9.7 9.3   GFR: Estimated Creatinine Clearance: 50 mL/min (A) (by C-G formula based on SCr of 1.12 mg/dL (H)). Liver Function Tests: Recent Labs  Lab 10/02/21 1241  AST 13*  ALT 13  ALKPHOS 80  BILITOT 0.4  PROT 7.2  ALBUMIN 3.9   No results for input(s): LIPASE, AMYLASE in the last 168 hours. No results for input(s): AMMONIA in the last 168 hours. Coagulation Profile: Recent Labs  Lab 10/02/21 1241  INR 0.9   Cardiac Enzymes: No results for input(s): CKTOTAL, CKMB, CKMBINDEX, TROPONINI in the last 168 hours. BNP (last 3 results) No results for input(s): PROBNP in the last 8760 hours. HbA1C: No results for input(s): HGBA1C in the last 72 hours. CBG: Recent Labs  Lab 10/06/21 0729 10/06/21 1146 10/06/21 1639  10/06/21 2107 10/07/21 0736  GLUCAP 222* 360* 439* 351* 150*   Lipid Profile: No results for input(s): CHOL, HDL, LDLCALC, TRIG, CHOLHDL, LDLDIRECT in the last 72 hours. Thyroid Function Tests: No results for input(s): TSH, T4TOTAL, FREET4, T3FREE, THYROIDAB in the last 72 hours. Anemia Panel: No results for input(s): VITAMINB12, FOLATE, FERRITIN, TIBC, IRON, RETICCTPCT in the last 72 hours. Sepsis Labs: No results for input(s): PROCALCITON, LATICACIDVEN in the last 168 hours.  Recent Results  (from the past 240 hour(s))  Resp Panel by RT-PCR (Flu A&B, Covid) Nasopharyngeal Swab     Status: None   Collection Time: 10/02/21  3:02 PM   Specimen: Nasopharyngeal Swab; Nasopharyngeal(NP) swabs in vial transport medium  Result Value Ref Range Status   SARS Coronavirus 2 by RT PCR NEGATIVE NEGATIVE Final    Comment: (NOTE) SARS-CoV-2 target nucleic acids are NOT DETECTED.  The SARS-CoV-2 RNA is generally detectable in upper respiratory specimens during the acute phase of infection. The lowest concentration of SARS-CoV-2 viral copies this assay can detect is 138 copies/mL. A negative result does not preclude SARS-Cov-2 infection and should not be used as the sole basis for treatment or other patient management decisions. A negative result may occur with  improper specimen collection/handling, submission of specimen other than nasopharyngeal swab, presence of viral mutation(s) within the areas targeted by this assay, and inadequate number of viral copies(<138 copies/mL). A negative result must be combined with clinical observations, patient history, and epidemiological information. The expected result is Negative.  Fact Sheet for Patients:  EntrepreneurPulse.com.au  Fact Sheet for Healthcare Providers:  IncredibleEmployment.be  This test is no t yet approved or cleared by the [place] FDA and  has been authorized for detection and/or diagnosis of SARS-CoV-2 by FDA under an Emergency Use Authorization (EUA). This EUA will remain  in effect (meaning this test can be used) for the duration of the COVID-19 declaration under Section 564(b)(1) of the Act, 21 U.S.C.section 360bbb-3(b)(1), unless the authorization is terminated  or revoked sooner.       Influenza A by PCR NEGATIVE NEGATIVE Final   Influenza B by PCR NEGATIVE NEGATIVE Final    Comment: (NOTE) The Xpert Xpress SARS-CoV-2/FLU/RSV plus assay is intended as an aid in the  diagnosis of influenza from Nasopharyngeal swab specimens and should not be used as a sole basis for treatment. Nasal washings and aspirates are unacceptable for Xpert Xpress SARS-CoV-2/FLU/RSV testing.  Fact Sheet for Patients: EntrepreneurPulse.com.au  Fact Sheet for Healthcare Providers: IncredibleEmployment.be  This test is not yet approved or cleared by the [place] FDA and has been authorized for detection and/or diagnosis of SARS-CoV-2 by FDA under an Emergency Use Authorization (EUA). This EUA will remain in effect (meaning this test can be used) for the duration of the COVID-19 declaration under Section 564(b)(1) of the Act, 21 U.S.C. section 360bbb-3(b)(1), unless the authorization is terminated or revoked.  Performed at [facility], [address], [place], [place] [address]          Radiology Studies: No results found.      Scheduled Meds:  insulin aspart  0-20 Units Subcutaneous TID WC   insulin aspart  0-5 Units Subcutaneous QHS   insulin aspart  10 Units Subcutaneous TID WC   insulin glargine-yfgn  40 Units Subcutaneous Daily   pantoprazole (PROTONIX) IV  40 mg Intravenous Once   senna-docusate  1 tablet Oral BID   Continuous Infusions:  methylPREDNISolone (SOLU-MEDROL) injection  LOS: 5 days    Time spent: 36 minutes spent on chart review, discussion with nursing staff, consultants, updating family and interview/physical exam; more than 50% of that time was spent in counseling and/or coordination of care.    Mica Ramdass J [place] DO [facility] Available via Epic secure chat 7am-7pm After these hours, please refer to coverage provider listed on [email or web address] 10/07/2021, 10:02 AM

## 2021-10-08 DIAGNOSIS — H469 Unspecified optic neuritis: Secondary | ICD-10-CM | POA: Diagnosis present

## 2021-10-08 LAB — GLUCOSE, CAPILLARY
Glucose-Capillary: 158 mg/dL — ABNORMAL HIGH (ref 70–99)
Glucose-Capillary: 314 mg/dL — ABNORMAL HIGH (ref 70–99)

## 2021-10-08 LAB — OLIGOCLONAL BANDS, CSF + SERM

## 2021-10-08 MED ORDER — PREDNISONE 20 MG PO TABS
50.0000 mg | ORAL_TABLET | Freq: Every day | ORAL | Status: AC
  Administered 2021-10-08: 50 mg via ORAL
  Filled 2021-10-08: qty 3

## 2021-10-08 MED ORDER — PREDNISONE 20 MG PO TABS: 40.0000 mg | ORAL_TABLET | Freq: Every day | ORAL | Status: DC

## 2021-10-08 MED ORDER — INSULIN ASPART 100 UNIT/ML IJ SOLN: 14.0000 [IU] | Freq: Three times a day (TID) | INTRAMUSCULAR | Status: DC

## 2021-10-08 MED ORDER — PREDNISONE 10 MG PO TABS: ORAL_TABLET | ORAL | 0 refills | Status: AC

## 2021-10-08 MED ORDER — INSULIN GLARGINE-YFGN 100 UNIT/ML ~~LOC~~ SOLN
50.0000 [IU] | Freq: Every day | SUBCUTANEOUS | Status: DC
  Administered 2021-10-08: 50 [IU] via SUBCUTANEOUS
  Filled 2021-10-08 (×3): qty 0.5

## 2021-10-08 MED ORDER — METHOCARBAMOL 500 MG PO TABS: 500.0000 mg | ORAL_TABLET | Freq: Four times a day (QID) | ORAL | 0 refills | Status: AC | PRN

## 2021-10-08 MED ORDER — PREDNISONE 20 MG PO TABS: 30.0000 mg | ORAL_TABLET | Freq: Every day | ORAL | Status: DC

## 2021-10-08 MED ORDER — PREDNISONE 20 MG PO TABS: 20.0000 mg | ORAL_TABLET | Freq: Every day | ORAL | Status: DC

## 2021-10-08 MED ORDER — PREDNISONE 20 MG PO TABS: 10.0000 mg | ORAL_TABLET | Freq: Every day | ORAL | Status: DC

## 2021-10-08 NOTE — Discharge Summary (Signed)
Physician Discharge Summary  KLARYSSA FAUTH WJX:[id number] DOB: 06/22/1961 DOA: 10/02/2021  PCP: Mylinda Latina, PA-C  Admit date: 10/02/2021 Discharge date: 10/08/2021  Admitted From: Home Disposition: Home  Recommendations for Outpatient Follow-up:  Follow up with PCP in 1-2 weeks Follow-up with [facility], neuro-ophthalmology Continue prednisone taper on discharge  Home Health: No Equipment/Devices: None  Discharge Condition: Stable CODE STATUS: Full code Diet recommendation: Heart healthy/consistent carbohydrate diet  History of present illness:  Regina Short is a 60 year old female with past medical history significant for type 2 diabetes mellitus, essential hypertension, history of headaches who presented to [facility] ED on 10/12 with 3-week history of blurred vision with associated left eye pain of fluctuating intensity.  Now patient continued to have similar symptoms in her right eye and was seen by ophthalmology, Dr. Edison Pace on the day of arrival who found no significant abnormal findings.  Given her persistence of symptoms and now affecting her right side, patient presented to ED for further evaluation.   In the ED, temperature 98.0 F, HR 73, RR 18, BP 122/77, SPO2 98% on room air.  Sodium 136, potassium 4.5, chloride 99, CO2 25, BUN 16, creatinine 0.60, glucose 218, AST 13, ALT 13, total bilirubin 0.4.  WBC 8.6, hemoglobin 13.5, platelets 248.  CRP less than 0.5.  ESR 5.  COVID-19 PCR negative.  Influenza A/B PCR negative.  CT head without contrast with no acute intracranial normalities.  MR brain with and without contrast with no acute infarction, no hemorrhage, no hydrocephalus no extra-axial collection or mass lesion; a few T2 hyperintense focus I noted bilateral periventricular white matter with no foci involving corpus callosum, juxtacortical white matter or in the imaged C-spine, no abnormal enhancement; not consistent with demyelinating disease pattern.  MR orbits limited  by motion but appears normal without abnormal signal.  Neurology was consulted.  Patient was started on Compazine and Depakote.  TRH consulted for further evaluation management of progressive headache with associated visual disturbance concerning for complicated migraine versus status migrainosus.  Hospital course:  Headache with continued visual disturbance concern for optic neuritis vs complicated migraine Patient presenting to the ED following recent ophthalmology evaluation that was reported normal due to 3-week history of progressive headache associated with left eye visual disturbance.  Now affecting her right eye.  MR brain/orbits unrevealing and not consistent with a demyelinating process.  ESR/CRP unrevealing.  Patient is afebrile without leukocytosis.  HIV nonreactive.  Neurology was consulted and followed during hospital course.  Patient underwent lumbar puncture by neurology on 10/03/2021.  CSF cell count, glucose and protein unrevealing. NMO ab and ANA negative. RPR non-reactive.  Angiotensin-converting enzyme within normal limits.  Started on IV Solu-Medrol 1 g daily and completed 5-day course while inpatient, will continue prednisone taper on discharge.  Neurology recommended referral [facility] for neuro-ophthalmology evaluation; [facility] called and they will follow-up with patient regarding appointment date/time.   Type 2 diabetes mellitus Hemoglobin A1c 7.7 on 08/09/2021.  Home medicines include glyburide-metformin 5-500 mg Home regimen includes glyburide-metformin and Jardiance.  Patient with significant hyperglycemia while inpatient, likely steroid effect from high-dose IV Solu-Medrol.  Upon tapering of IV steroids, glucose improved.  Patient was offered coverage insulin after discharge while still on prednisone taper, but declined.  Resume home medication.  Outpatient follow-up with PCP.   Essential hypertension On benazepril 10 mg p.o. daily at home.    Discharge  Diagnoses:  Principal Problem:   Complicated migraine with status  migrainosus Active Problems:   Essential hypertension   Type 2 diabetes mellitus with hyperglycemia, without long-term current use of insulin (HCC)   Vision disturbance   Optic neuritis    Discharge Instructions   Allergies as of 10/08/2021       Reactions   Micardis [telmisartan]    rash   Ciprofloxacin Rash   Tramadol Rash        Medication List     STOP taking these medications    fluticasone 50 MCG/ACT nasal spray Commonly known as: FLONASE       TAKE these medications    Accu-Chek FastClix Lancet Kit Accu-Chek Fastclix Lancet Drum   Accu-Chek Guide test strip Generic drug: glucose blood Check blood sugars BID and prn - E11.65   benazepril 10 MG tablet Commonly known as: LOTENSIN Take 1 tablet (10 mg total) by mouth daily.   empagliflozin 10 MG Tabs tablet Commonly known as: Jardiance Take 1 tablet (10 mg total) by mouth daily before breakfast.   ergocalciferol 1.25 MG (50000 UT) capsule Commonly known as: Drisdol Take 1 capsule (50,000 Units total) by mouth once a week.   glyBURIDE-metformin 5-500 MG tablet Commonly known as: GLUCOVANCE Take 1.5 tablets po BID or if blood sugar drops take 1 tablet po BID   methocarbamol 500 MG tablet Commonly known as: ROBAXIN Take 1 tablet (500 mg total) by mouth every 6 (six) hours as needed for up to 14 days for muscle spasms.   predniSONE 10 MG tablet Commonly known as: DELTASONE Take 4 tablets (40 mg total) by mouth daily for 1 day, THEN 3 tablets (30 mg total) daily for 1 day, THEN 2 tablets (20 mg total) daily for 1 day, THEN 1 tablet (10 mg total) daily for 1 day. Start taking on: October 09, 2021        Follow-up Information     McDonough, Si Gaul, PA-C. Schedule an appointment as soon as possible for a visit in 1 week(s).   Specialty: Physician Assistant Contact information: [place]  [id number] [phone]         [facility]. Schedule an appointment as soon as possible for a visit in 1 week(s).   Why: [facility] [address] [place], [place], [address].  To schedule an appointment call: [phone]               Allergies  Allergen Reactions   Micardis [Telmisartan]     rash   Ciprofloxacin Rash   Tramadol Rash    Consultations: Neurology, Dr. Leonel Ramsay   Procedures/Studies: CT HEAD WO CONTRAST  Result Date: 10/02/2021 CLINICAL DATA:  Stroke suspected. Vision loss and left eye. Neurologic deficit. EXAM: CT HEAD WITHOUT CONTRAST TECHNIQUE: Contiguous axial images were obtained from the base of the skull through the vertex without intravenous contrast. COMPARISON:  None. FINDINGS: Brain: No evidence of acute infarction, hemorrhage, hydrocephalus, extra-axial collection or mass lesion/mass effect. Vascular: No hyperdense vessel or unexpected calcification. Skull: Normal. Negative for fracture or focal lesion. Sinuses/Orbits: No acute finding. Other: None. IMPRESSION: No acute intracranial abnormalities. Normal brain. Electronically Signed   By: Kerby Moors M.D.   On: 10/02/2021 13:23   MR Brain W and Wo Contrast  Result Date: 10/02/2021 CLINICAL DATA:  Three weeks of vision loss, concern for multiple sclerosis EXAM: MRI HEAD AND ORBITS WITHOUT AND WITH CONTRAST TECHNIQUE: Multiplanar, multiecho pulse sequences of the brain and surrounding structures were obtained without and with intravenous contrast. Multiplanar, multiecho  pulse sequences of the orbits and surrounding structures were obtained including fat saturation techniques, before and after intravenous contrast administration. CONTRAST:  33m GADAVIST GADOBUTROL 1 MMOL/ML IV SOLN COMPARISON:  No prior MRI, correlation is made with CT head 10/02/2021 FINDINGS: MRI HEAD FINDINGS Brain: No acute infarction, hemorrhage, hydrocephalus, extra-axial collection or mass lesion. A few T2  hyperintense foci are noted in the bilateral periventricular white matter; no foci involving the corpus callosum, juxtacortical white matter, infratentorially, or in the imaged cervical spine. No abnormal enhancement. Prominence of the right greater than left cerebellar tonsils, which extend up to 4 mm the on the foramen magnum. Vascular: Normal flow voids. Skull and upper cervical spine: Normal marrow signal. Other: The mastoids are well aerated. MRI ORBITS FINDINGS Evaluation is somewhat limited by motion artifact. Orbits: No traumatic or inflammatory finding. Globes, optic nerves, orbital fat, extraocular muscles, vascular structures, and lacrimal glands are normal. No definite abnormal enhancement. Visualized sinuses: Mucosal thickening in the left maxillary sinus. Soft tissues: Negative. IMPRESSION: 1. No acute intracranial process. Fall there are scattered T2 hyperintense foci in the bilateral periventricular white matter, these are not in a pattern suspicious for demyelinating disease and are favored to be the sequela of small vessel ischemic disease. 2. Evaluation the orbits is somewhat limited by motion artifact. Within this limitation, normal appearance of the orbits, without abnormal signal. Electronically Signed   By: AMerilyn BabaM.D.   On: 10/02/2021 17:30   MR ORBITS W WO CONTRAST  Result Date: 10/02/2021 CLINICAL DATA:  Three weeks of vision loss, concern for multiple sclerosis EXAM: MRI HEAD AND ORBITS WITHOUT AND WITH CONTRAST TECHNIQUE: Multiplanar, multiecho pulse sequences of the brain and surrounding structures were obtained without and with intravenous contrast. Multiplanar, multiecho pulse sequences of the orbits and surrounding structures were obtained including fat saturation techniques, before and after intravenous contrast administration. CONTRAST:  645mGADAVIST GADOBUTROL 1 MMOL/ML IV SOLN COMPARISON:  No prior MRI, correlation is made with CT head 10/02/2021 FINDINGS: MRI HEAD  FINDINGS Brain: No acute infarction, hemorrhage, hydrocephalus, extra-axial collection or mass lesion. A few T2 hyperintense foci are noted in the bilateral periventricular white matter; no foci involving the corpus callosum, juxtacortical white matter, infratentorially, or in the imaged cervical spine. No abnormal enhancement. Prominence of the right greater than left cerebellar tonsils, which extend up to 4 mm the on the foramen magnum. Vascular: Normal flow voids. Skull and upper cervical spine: Normal marrow signal. Other: The mastoids are well aerated. MRI ORBITS FINDINGS Evaluation is somewhat limited by motion artifact. Orbits: No traumatic or inflammatory finding. Globes, optic nerves, orbital fat, extraocular muscles, vascular structures, and lacrimal glands are normal. No definite abnormal enhancement. Visualized sinuses: Mucosal thickening in the left maxillary sinus. Soft tissues: Negative. IMPRESSION: 1. No acute intracranial process. Fall there are scattered T2 hyperintense foci in the bilateral periventricular white matter, these are not in a pattern suspicious for demyelinating disease and are favored to be the sequela of small vessel ischemic disease. 2. Evaluation the orbits is somewhat limited by motion artifact. Within this limitation, normal appearance of the orbits, without abnormal signal. Electronically Signed   By: AlMerilyn Baba.D.   On: 10/02/2021 17:30     Subjective:   Discharge Exam: Vitals:   10/08/21 0418 10/08/21 0807  BP: (!) 154/73 122/68  Pulse: (!) 43 (!) 49  Resp: 18 20  Temp: 98 F (36.7 C) 98.2 F (36.8 C)  SpO2: 100% 100%   Vitals:  10/07/21 1619 10/07/21 2044 10/08/21 0418 10/08/21 0807  BP: 119/67 119/66 (!) 154/73 122/68  Pulse: (!) 54 (!) 57 (!) 43 (!) 49  Resp: _0 Temp: 98.6 F (37 C) 98.6 F (37 C) 98 F (36.7 C) 98.2 F (36.8 C)  TempSrc: Oral Oral Oral Oral  SpO2: 97% 96% 100% 100%  Weight:      Height:        General: Pt  is alert, awake, not in acute distress HEENT: OD with continued a Farrand pupillary defect with slight reactivity Cardiovascular: RRR, S1/S2 +, no rubs, no gallops Respiratory: CTA bilaterally, no wheezing, no rhonchi Abdominal: Soft, NT, ND, bowel sounds + Extremities: no edema, no cyanosis    The results of significant diagnostics from this hospitalization (including imaging, microbiology, ancillary and laboratory) are listed below for reference.     Microbiology: Recent Results (from the past 240 hour(s))  Resp Panel by RT-PCR (Flu A&B, Covid) Nasopharyngeal Swab     Status: None   Collection Time: 10/02/21  3:02 PM   Specimen: Nasopharyngeal Swab; Nasopharyngeal(NP) swabs in vial transport medium  Result Value Ref Range Status   SARS Coronavirus 2 by RT PCR NEGATIVE NEGATIVE Final    Comment: (NOTE) SARS-CoV-2 target nucleic acids are NOT DETECTED.  The SARS-CoV-2 RNA is generally detectable in upper respiratory specimens during the acute phase of infection. The lowest concentration of SARS-CoV-2 viral copies this assay can detect is 138 copies/mL. A negative result does not preclude SARS-Cov-2 infection and should not be used as the sole basis for treatment or other patient management decisions. A negative result may occur with  improper specimen collection/handling, submission of specimen other than nasopharyngeal swab, presence of viral mutation(s) within the areas targeted by this assay, and inadequate number of viral copies(<138 copies/mL). A negative result must be combined with clinical observations, patient history, and epidemiological information. The expected result is Negative.  Fact Sheet for Patients:  EntrepreneurPulse.com.au  Fact Sheet for Healthcare Providers:  IncredibleEmployment.be  This test is no t yet approved or cleared by the [place] FDA and  has been authorized for detection and/or diagnosis of SARS-CoV-2  by FDA under an Emergency Use Authorization (EUA). This EUA will remain  in effect (meaning this test can be used) for the duration of the COVID-19 declaration under Section 564(b)(1) of the Act, 21 U.S.C.section 360bbb-3(b)(1), unless the authorization is terminated  or revoked sooner.       Influenza A by PCR NEGATIVE NEGATIVE Final   Influenza B by PCR NEGATIVE NEGATIVE Final    Comment: (NOTE) The Xpert Xpress SARS-CoV-2/FLU/RSV plus assay is intended as an aid in the diagnosis of influenza from Nasopharyngeal swab specimens and should not be used as a sole basis for treatment. Nasal washings and aspirates are unacceptable for Xpert Xpress SARS-CoV-2/FLU/RSV testing.  Fact Sheet for Patients: EntrepreneurPulse.com.au  Fact Sheet for Healthcare Providers: IncredibleEmployment.be  This test is not yet approved or cleared by the [place] FDA and has been authorized for detection and/or diagnosis of SARS-CoV-2 by FDA under an Emergency Use Authorization (EUA). This EUA will remain in effect (meaning this test can be used) for the duration of the COVID-19 declaration under Section 564(b)(1) of the Act, 21 U.S.C. section 360bbb-3(b)(1), unless the authorization is terminated or revoked.  Performed at [facility], [address] [place],  [address]      Labs: BNP (last 3 results) No results for input(s): BNP in the last  8760 hours. Basic Metabolic Panel: Recent Labs  Lab 10/02/21 1241 10/04/21 1910 10/05/21 1126 10/06/21 1724  NA 136 128* 132* 131*  K 4.5 5.6* 4.3 4.9  CL 99 92* 96* 96*  CO2 _0 GLUCOSE 218* 870* 300* 476*  BUN 16 27* 22* 28*  CREATININE 0.60 1.04* 0.63 1.12*  CALCIUM 9.9 9.5 9.7 9.3   Liver Function Tests: Recent Labs  Lab 10/02/21 1241 10/03/21 1446  AST 13*  --   ALT 13  --   ALKPHOS 80  --   BILITOT 0.4  --   PROT 7.2  --   ALBUMIN 3.9 4.2   No results for  input(s): LIPASE, AMYLASE in the last 168 hours. No results for input(s): AMMONIA in the last 168 hours. CBC: Recent Labs  Lab 10/02/21 1241  WBC 8.6  NEUTROABS 5.8  HGB 13.5  HCT 39.9  MCV 80.4  PLT 248   Cardiac Enzymes: No results for input(s): CKTOTAL, CKMB, CKMBINDEX, TROPONINI in the last 168 hours. BNP: Invalid input(s): POCBNP CBG: Recent Labs  Lab 10/07/21 0736 10/07/21 1132 10/07/21 1645 10/07/21 2034 10/08/21 0804  GLUCAP 150* 290* 149* 341* 158*   D-Dimer No results for input(s): DDIMER in the last 72 hours. Hgb A1c No results for input(s): HGBA1C in the last 72 hours. Lipid Profile No results for input(s): CHOL, HDL, LDLCALC, TRIG, CHOLHDL, LDLDIRECT in the last 72 hours. Thyroid function studies No results for input(s): TSH, T4TOTAL, T3FREE, THYROIDAB in the last 72 hours.  Invalid input(s): FREET3 Anemia work up No results for input(s): VITAMINB12, FOLATE, FERRITIN, TIBC, IRON, RETICCTPCT in the last 72 hours. Urinalysis    Component Value Date/Time   COLORURINE YELLOW (A) 02/04/2016 1006   APPEARANCEUR Turbid (A) 09/28/2020 1228   LABSPEC 1.033 (H) 02/04/2016 1006   LABSPEC 1.028 01/03/2013 1608   PHURINE 5.0 02/04/2016 1006   GLUCOSEU 1+ (A) 09/28/2020 1228   GLUCOSEU >=500 01/03/2013 1608   HGBUR 1+ (A) 02/04/2016 1006   BILIRUBINUR Negative 09/28/2020 1228   BILIRUBINUR Negative 01/03/2013 1608   KETONESUR TRACE (A) 02/04/2016 1006   PROTEINUR Trace 09/28/2020 1228   PROTEINUR NEGATIVE 02/04/2016 1006   NITRITE Negative 09/28/2020 1228   NITRITE NEGATIVE 02/04/2016 1006   LEUKOCYTESUR 1+ (A) 09/28/2020 1228   LEUKOCYTESUR Negative 01/03/2013 1608   Sepsis Labs Invalid input(s): PROCALCITONIN,  WBC,  LACTICIDVEN Microbiology Recent Results (from the past 240 hour(s))  Resp Panel by RT-PCR (Flu A&B, Covid) Nasopharyngeal Swab     Status: None   Collection Time: 10/02/21  3:02 PM   Specimen: Nasopharyngeal Swab; Nasopharyngeal(NP)  swabs in vial transport medium  Result Value Ref Range Status   SARS Coronavirus 2 by RT PCR NEGATIVE NEGATIVE Final    Comment: (NOTE) SARS-CoV-2 target nucleic acids are NOT DETECTED.  The SARS-CoV-2 RNA is generally detectable in upper respiratory specimens during the acute phase of infection. The lowest concentration of SARS-CoV-2 viral copies this assay can detect is 138 copies/mL. A negative result does not preclude SARS-Cov-2 infection and should not be used as the sole basis for treatment or other patient management decisions. A negative result may occur with  improper specimen collection/handling, submission of specimen other than nasopharyngeal swab, presence of viral mutation(s) within the areas targeted by this assay, and inadequate number of viral copies(<138 copies/mL). A negative result must be combined with clinical observations, patient history, and epidemiological information. The expected result is Negative.  Fact Sheet for Patients:  EntrepreneurPulse.com.au  Fact Sheet for Healthcare Providers:  IncredibleEmployment.be  This test is no t yet approved or cleared by the [place] FDA and  has been authorized for detection and/or diagnosis of SARS-CoV-2 by FDA under an Emergency Use Authorization (EUA). This EUA will remain  in effect (meaning this test can be used) for the duration of the COVID-19 declaration under Section 564(b)(1) of the Act, 21 U.S.C.section 360bbb-3(b)(1), unless the authorization is terminated  or revoked sooner.       Influenza A by PCR NEGATIVE NEGATIVE Final   Influenza B by PCR NEGATIVE NEGATIVE Final    Comment: (NOTE) The Xpert Xpress SARS-CoV-2/FLU/RSV plus assay is intended as an aid in the diagnosis of influenza from Nasopharyngeal swab specimens and should not be used as a sole basis for treatment. Nasal washings and aspirates are unacceptable for Xpert Xpress  SARS-CoV-2/FLU/RSV testing.  Fact Sheet for Patients: EntrepreneurPulse.com.au  Fact Sheet for Healthcare Providers: IncredibleEmployment.be  This test is not yet approved or cleared by the [place] FDA and has been authorized for detection and/or diagnosis of SARS-CoV-2 by FDA under an Emergency Use Authorization (EUA). This EUA will remain in effect (meaning this test can be used) for the duration of the COVID-19 declaration under Section 564(b)(1) of the Act, 21 U.S.C. section 360bbb-3(b)(1), unless the authorization is terminated or revoked.  Performed at [facility], [address], [place], [place] [address]      Time coordinating discharge: Over 30 minutes  SIGNED:   Kendrick Haapala J [place] DO  [facility] 10/08/2021, 10:00 AM

## 2021-10-08 NOTE — TOC CM/SW Note (Signed)
Patient has orders to discharge home today. Chart reviewed. PCP is Lynn Ito, PA-C. On room air. No wounds. No TOC needs identified. CSW signing off.  Charlynn Court, CSW [phone]

## 2021-10-09 LAB — ANGIOTENSIN CONVERTING ENZYME, CSF: Angio Convert Enzyme: <1.5 U/L (ref 0.0–3.1)

## 2021-10-10 ENCOUNTER — Other Ambulatory Visit: Payer: Self-pay

## 2021-10-10 ENCOUNTER — Ambulatory Visit (INDEPENDENT_AMBULATORY_CARE_PROVIDER_SITE_OTHER): Payer: BLUE CROSS/BLUE SHIELD | Admitting: Physician Assistant

## 2021-10-10 ENCOUNTER — Encounter: Payer: Self-pay | Admitting: Physician Assistant

## 2021-10-10 DIAGNOSIS — H53133 Sudden visual loss, bilateral: Secondary | ICD-10-CM | POA: Diagnosis not present

## 2021-10-10 DIAGNOSIS — I10 Essential (primary) hypertension: Secondary | ICD-10-CM

## 2021-10-10 DIAGNOSIS — E1165 Type 2 diabetes mellitus with hyperglycemia: Secondary | ICD-10-CM | POA: Diagnosis not present

## 2021-10-10 DIAGNOSIS — Z09 Encounter for follow-up examination after completed treatment for conditions other than malignant neoplasm: Secondary | ICD-10-CM

## 2021-10-10 MED ORDER — GLYBURIDE-METFORMIN 5-500 MG PO TABS: ORAL_TABLET | ORAL | 1 refills | Status: DC

## 2021-10-10 MED ORDER — BENAZEPRIL HCL 10 MG PO TABS: 10.0000 mg | ORAL_TABLET | Freq: Every day | ORAL | 3 refills | Status: DC

## 2021-10-10 NOTE — Progress Notes (Signed)
[facility] [place], [place] [address]  Internal MEDICINE  Office Visit Note  Patient Name: Regina Short  [id number]  [id number]  Date of Service: 10/10/2021     Chief Complaint  Patient presents with   Hospitalization Follow-up    Lost sight in both eyes.  Left side vision came back but right hasn't.  Needs referral to [facility]     HPI Pt is here for recent hospital follow up. -Pt went to ED 10/02/21 where she was then admitted until 61/60/73 for complicated migraine with status migrainosus versus optic neuritis.  -She had blurred vision with fluctuating left eye pain for 3 weeks. She had gone to see her eye doctor who then referred her over to a specialist, Dr. Edison Pace who upon evaluation found no significant abnormal findings but recommended she go to the ED given progression of symptoms into her right eye.   -Upon review of discharge summary, in the ED she had several labs drawn which were largely unremarkable in addition to a CT of the head without contrast revealing no acute abnormalities.  An MRI of the brain and orbits with and without contrast showed:   IMPRESSION:          1. No acute intracranial process. Fall there are scattered T2          hyperintense foci in the bilateral periventricular white matter,          these are not in a pattern suspicious for demyelinating disease and          are favored to be the sequela of small vessel ischemic disease.          2. Evaluation the orbits is somewhat limited by motion artifact.          Within this limitation, normal appearance of the orbits, without          abnormal signal. -Neurology was then consulted and patient was started on Compazine and Depakote. Given progressive headache with associated visual disturbance and concern for complicated migraine vs optic neuritis, hospitalists were consulted and patient was admitted.  Patient did undergo an LP by neurology on 1013 with CSF unrevealing she  was started on IV Solu-Medrol for 5 days while inpatient and was discharged on a prednisone taper.  Neurology recommended referral to [facility] eye clinic for neuro-ophthalmology evaluation.  Of note sugars throughout hospital stay did become elevated likely secondary to IV Solu-Medrol.  Once tapering of IV steroids occurred glucose did improve and patient reports improved numbers since discharging on oral prednisone taper.  -Patient now reports her left eye sight came back during admission however her right eye still has loss of vision.  During exam she was able to see limited light, but could not discern anything, with minimally reactive pupil which she states is in accordance with what was seen during hospital stay as well likely due to LP. She was discharged with methocarbamol which she is taking as needed and has been helping. -Also mentions she did have her flu shot and tdap recently and will need to obtain dates  Current Medication: Outpatient Encounter Medications as of 10/10/2021  Medication Sig   empagliflozin (JARDIANCE) 10 MG TABS tablet Take 1 tablet (10 mg total) by mouth daily before breakfast.   ergocalciferol (DRISDOL) 1.25 MG (50000 UT) capsule Take 1 capsule (50,000 Units total) by mouth once a week.   glucose blood (ACCU-CHEK GUIDE) test strip Check blood sugars BID and prn -  E11.65   Lancets Misc. (ACCU-CHEK FASTCLIX LANCET) KIT Accu-Chek Fastclix Lancet Drum   methocarbamol (ROBAXIN) 500 MG tablet Take 1 tablet (500 mg total) by mouth every 6 (six) hours as needed for up to 14 days for muscle spasms.   predniSONE (DELTASONE) 10 MG tablet Take 4 tablets (40 mg total) by mouth daily for 1 day, THEN 3 tablets (30 mg total) daily for 1 day, THEN 2 tablets (20 mg total) daily for 1 day, THEN 1 tablet (10 mg total) daily for 1 day.   [DISCONTINUED] benazepril (LOTENSIN) 10 MG tablet Take 1 tablet (10 mg total) by mouth daily.   benazepril (LOTENSIN) 10 MG tablet Take 1 tablet (10 mg total)  by mouth daily.   glyBURIDE-metformin (GLUCOVANCE) 5-500 MG tablet Take 1.5 tablets po BID or if blood sugar drops take 1 tablet po BID   [DISCONTINUED] glyBURIDE-metformin (GLUCOVANCE) 5-500 MG tablet Take 1.5 tablets po BID or if blood sugar drops take 1 tablet po BID   No facility-administered encounter medications on file as of 10/10/2021.    Surgical History: Past Surgical History:  Procedure Laterality Date   ABDOMINAL HYSTERECTOMY     ABDOMINAL SURGERY     5 tumors removed   BREAST BIOPSY Right 02/02/15 core   focal fibroadenomatoid changes and sclerosis    Medical History: Past Medical History:  Diagnosis Date   Chronic back pain    Diabetes mellitus without complication (HCC)    GERD (gastroesophageal reflux disease)    Hand tendonitis    Hypertension     Family History: Family History  Problem Relation Age of Onset   Congestive Heart Failure Other    Breast cancer Maternal Aunt 60    Social History   Socioeconomic History   Marital status: Single    Spouse name: Not on file   Number of children: Not on file   Years of education: Not on file   Highest education level: Not on file  Occupational History   Not on file  Tobacco Use   Smoking status: Former    Packs/day: 0.50    Types: Cigarettes    Quit date: 02/26/2021    Years since quitting: 0.6   Smokeless tobacco: Never   Tobacco comments:    pack of cigarettes last two days  Substance and Sexual Activity   Alcohol use: Yes    Alcohol/week: 0.0 standard drinks    Comment: holidays   Drug use: No   Sexual activity: Yes    Birth control/protection: None  Other Topics Concern   Not on file  Social History Narrative   Not on file   Social Determinants of Health   Financial Resource Strain: Not on file  Food Insecurity: Not on file  Transportation Needs: Not on file  Physical Activity: Not on file  Stress: Not on file  Social Connections: Not on file  Intimate Partner Violence: Not on file       Review of Systems  Constitutional:  Negative for chills, fatigue and unexpected weight change.  HENT:  Negative for congestion, postnasal drip, rhinorrhea, sneezing and sore throat.   Eyes:  Positive for visual disturbance. Negative for redness.  Respiratory:  Negative for cough, chest tightness and shortness of breath.   Cardiovascular:  Negative for chest pain and palpitations.  Gastrointestinal:  Negative for abdominal pain, constipation, diarrhea, nausea and vomiting.  Genitourinary:  Negative for dysuria and frequency.  Musculoskeletal:  Positive for back pain. Negative for arthralgias, joint swelling and  neck pain.  Skin:  Negative for rash.  Neurological:  Positive for headaches. Negative for tremors and numbness.  Hematological:  Negative for adenopathy. Does not bruise/bleed easily.  Psychiatric/Behavioral:  Negative for behavioral problems (Depression), sleep disturbance and suicidal ideas. The patient is nervous/anxious.    Vital Signs: BP 118/70   Pulse 76   Temp 98.4 F (36.9 C)   Resp 16   Ht '5\' 6"'  (1.676 m)   Wt 142 lb 9.6 oz (64.7 kg)   SpO2 100%   BMI 23.02 kg/m    Physical Exam Vitals and nursing note reviewed.  Constitutional:      General: She is not in acute distress.    Appearance: She is well-developed and normal weight. She is not diaphoretic.  HENT:     Head: Normocephalic and atraumatic.     Mouth/Throat:     Pharynx: No oropharyngeal exudate.  Eyes:     Comments: Minimal reactivity to light in right eye, normal reactivity on left  Neck:     Thyroid: No thyromegaly.     Vascular: No JVD.     Trachea: No tracheal deviation.  Cardiovascular:     Rate and Rhythm: Normal rate and regular rhythm.     Heart sounds: Normal heart sounds. No murmur heard.   No friction rub. No gallop.  Pulmonary:     Effort: Pulmonary effort is normal. No respiratory distress.     Breath sounds: No wheezing or rales.  Chest:     Chest wall: No tenderness.   Abdominal:     General: Bowel sounds are normal.     Palpations: Abdomen is soft.  Musculoskeletal:        General: Normal range of motion.     Cervical back: Normal range of motion and neck supple.  Lymphadenopathy:     Cervical: No cervical adenopathy.  Skin:    General: Skin is warm and dry.  Neurological:     Mental Status: She is alert and oriented to person, place, and time.     Cranial Nerves: No cranial nerve deficit.  Psychiatric:        Behavior: Behavior normal.        Thought Content: Thought content normal.        Judgment: Judgment normal.      Assessment/Plan: 1. Encounter for examination following treatment at hospital Reviewed hospital course and medication changes  2. Acute loss of vision, bilateral Improvement on left, but still no vision on right, therefore will refer to [facility] eye clinic, neuro-ophthalmology for evaluation as advised on ED discharge. Patient will complete prednisone taper. Call or go to ED if acute worsening. - Ambulatory referral to Ophthalmology  3. Essential hypertension Stable, refill sent - benazepril (LOTENSIN) 10 MG tablet; Take 1 tablet (10 mg total) by mouth daily.  Dispense: 90 tablet; Refill: 3  4. Type 2 diabetes mellitus with hyperglycemia, without long-term current use of insulin (HCC) Improving with switch from IV steroids to oral prednisone taper. Refill sent and will continue to monitor. Will also need to consider addition of statin and possible vasc studies/carotid US once acute concerns addressed. - glyBURIDE-metformin (GLUCOVANCE) 5-500 MG tablet; Take 1.5 tablets po BID or if blood sugar drops take 1 tablet po BID  Dispense: 270 tablet; Refill: 1   General Counseling: Mariateresa verbalizes understanding of the findings of todays visit and agrees with plan of treatment. I have discussed any further diagnostic evaluation that may be needed or ordered today. We  also reviewed her medications today. she has been encouraged to call  the office with any questions or concerns that should arise related to todays visit.    Counseling:    Orders Placed This Encounter  Procedures   Ambulatory referral to Ophthalmology     This patient was seen by Drema Dallas, PA-C in collaboration with Dr. Clayborn Bigness as a part of collaborative care agreement.   I have reviewed all medical records from hospital follow up including radiology reports and consults from other physicians. Appropriate follow up diagnostics will be scheduled as needed. Patient/ Family understands the plan of treatment. Time spent 45 minutes.   Dr Lavera Guise, MD Internal Medicine

## 2021-10-11 ENCOUNTER — Telehealth: Payer: Self-pay

## 2021-10-11 NOTE — Telephone Encounter (Signed)
Opthalmology referral manually faxed to [facility] [phone]

## 2021-10-14 NOTE — Telephone Encounter (Signed)
Received call from patient's sister regarding referral. She stated she called [facility], they told her they have not received referral. I called [phone]. Natalia verified they received 10/11/21. I notified patient and sister, gave them telephone #-[phone]

## 2021-10-18 ENCOUNTER — Other Ambulatory Visit: Payer: BLUE CROSS/BLUE SHIELD | Admitting: Nurse Practitioner

## 2021-10-18 ENCOUNTER — Telehealth: Payer: Self-pay

## 2021-10-21 ENCOUNTER — Other Ambulatory Visit: Payer: BLUE CROSS/BLUE SHIELD | Admitting: Nurse Practitioner

## 2021-10-21 NOTE — Telephone Encounter (Signed)
Spoke to pt, explained that the specialists she has been seeing are referring her based on the test results and the care they think she needs. Pt explain she has another appt this week and she is hoping this is the last one and will get a better understanding of what is going on with her vision/eyes.

## 2021-11-04 ENCOUNTER — Other Ambulatory Visit: Payer: Self-pay

## 2021-11-04 ENCOUNTER — Other Ambulatory Visit: Payer: Self-pay | Admitting: Ophthalmology

## 2021-11-04 ENCOUNTER — Telehealth: Payer: Self-pay

## 2021-11-04 DIAGNOSIS — H469 Unspecified optic neuritis: Secondary | ICD-10-CM

## 2021-11-04 DIAGNOSIS — G379 Demyelinating disease of central nervous system, unspecified: Secondary | ICD-10-CM

## 2021-11-04 MED ORDER — METHOCARBAMOL 500 MG PO TABS: 500.0000 mg | ORAL_TABLET | Freq: Two times a day (BID) | ORAL | 0 refills | Status: DC | PRN

## 2021-11-04 NOTE — Telephone Encounter (Signed)
Pt called that need refill for methocarbamol (ROBAXIN) 500 MG tablet lauren [facility] and also advised pt that for prednisone she need to call her eye dr and advised her that its looks like they already send

## 2021-11-11 ENCOUNTER — Ambulatory Visit: Payer: BLUE CROSS/BLUE SHIELD

## 2021-11-13 ENCOUNTER — Other Ambulatory Visit: Payer: Self-pay | Admitting: Ophthalmology

## 2021-11-13 DIAGNOSIS — H469 Unspecified optic neuritis: Secondary | ICD-10-CM

## 2021-11-13 DIAGNOSIS — G379 Demyelinating disease of central nervous system, unspecified: Secondary | ICD-10-CM

## 2021-11-18 ENCOUNTER — Ambulatory Visit
Admission: RE | Admit: 2021-11-18 | Discharge: 2021-11-18 | Disposition: A | Discharge status: Home / Self Care | Payer: BLUE CROSS/BLUE SHIELD | Source: Ambulatory Visit | Attending: Ophthalmology | Admitting: Ophthalmology

## 2021-11-18 ENCOUNTER — Other Ambulatory Visit: Payer: Self-pay

## 2021-11-18 DIAGNOSIS — H469 Unspecified optic neuritis: Secondary | ICD-10-CM | POA: Diagnosis present

## 2021-11-18 DIAGNOSIS — G379 Demyelinating disease of central nervous system, unspecified: Secondary | ICD-10-CM | POA: Diagnosis present

## 2021-11-18 MED ORDER — GADOBUTROL 1 MMOL/ML IV SOLN
5.0000 mL | Freq: Once | INTRAVENOUS | Status: AC | PRN
  Administered 2021-11-18: 14:00:00 5 mL via INTRAVENOUS

## 2021-11-19 ENCOUNTER — Ambulatory Visit: Payer: BLUE CROSS/BLUE SHIELD

## 2021-12-11 ENCOUNTER — Other Ambulatory Visit: Payer: Self-pay | Admitting: Internal Medicine

## 2021-12-11 ENCOUNTER — Other Ambulatory Visit: Payer: Self-pay

## 2021-12-11 ENCOUNTER — Ambulatory Visit
Admission: RE | Admit: 2021-12-11 | Discharge: 2021-12-11 | Disposition: A | Discharge status: Home / Self Care | Payer: BLUE CROSS/BLUE SHIELD | Source: Ambulatory Visit | Attending: Internal Medicine | Admitting: Internal Medicine

## 2021-12-11 DIAGNOSIS — Z1231 Encounter for screening mammogram for malignant neoplasm of breast: Secondary | ICD-10-CM | POA: Diagnosis not present

## 2021-12-13 ENCOUNTER — Other Ambulatory Visit: Payer: Self-pay

## 2021-12-13 ENCOUNTER — Ambulatory Visit (INDEPENDENT_AMBULATORY_CARE_PROVIDER_SITE_OTHER): Payer: BLUE CROSS/BLUE SHIELD | Admitting: Physician Assistant

## 2021-12-13 ENCOUNTER — Encounter: Payer: Self-pay | Admitting: Physician Assistant

## 2021-12-13 VITALS — BP 138/88 | HR 69 | Temp 98.4°F | Resp 16 | Ht 66.0 in | Wt 145.4 lb

## 2021-12-13 DIAGNOSIS — Z0001 Encounter for general adult medical examination with abnormal findings: Secondary | ICD-10-CM

## 2021-12-13 DIAGNOSIS — Z01419 Encounter for gynecological examination (general) (routine) without abnormal findings: Secondary | ICD-10-CM | POA: Diagnosis not present

## 2021-12-13 DIAGNOSIS — E1165 Type 2 diabetes mellitus with hyperglycemia: Secondary | ICD-10-CM

## 2021-12-13 DIAGNOSIS — E782 Mixed hyperlipidemia: Secondary | ICD-10-CM | POA: Diagnosis not present

## 2021-12-13 DIAGNOSIS — H469 Unspecified optic neuritis: Secondary | ICD-10-CM

## 2021-12-13 DIAGNOSIS — R300 Dysuria: Secondary | ICD-10-CM

## 2021-12-13 DIAGNOSIS — I10 Essential (primary) hypertension: Secondary | ICD-10-CM

## 2021-12-13 LAB — POCT GLYCOSYLATED HEMOGLOBIN (HGB A1C): Hemoglobin A1C: 8.7 % — AB (ref 4.0–5.6)

## 2021-12-13 MED ORDER — ROSUVASTATIN CALCIUM 5 MG PO TABS: ORAL_TABLET | ORAL | 3 refills | Status: DC

## 2021-12-13 NOTE — Progress Notes (Signed)
[facility], [place] [address]  Internal MEDICINE  Office Visit Note  Patient Name: Regina Short  [id number]  [id number]  Date of Service: 12/17/2021  Chief Complaint  Patient presents with   Annual Exam   Diabetes   Hypertension   Quality Metric Gaps    Colonoscopy, Foot Exam     HPI Pt is here for routine health maintenance examination -Vision is about the same since last visit, still on prednisone which is leading to higher readings. Left eye is good but right eye is still blurry. Will have f/u with specialist after holidays. Officially diagnosed as bilateral optic neuritis -Did have a low reading of 53 one morning, normally 114-170 with steroids. Supposed to continue steroids until the end of December.  -Fell because she missed a step on stairs in Nov and bumped her knee leg but it has healed with cleaning and vaseline -BP looks good -Stopped smoking -Colonoscopy done and found a polyp, f/u in 10years still -Mammogram yesterday, UTD on pap last year -Flu shot done Nov 5th -foot exam done, decreased sensation and callouses and fungus on nail--pt has seen podiatry before but did not like foot care. Advised she may benefit from routine podiatry visits if she is willing in future. -Labs previously reviewed, did discuss starting statin for CV protection as a diabetic esp with elevated lipids. Also discussed considering carotid US, however pt would like to hold off due to all the recent testing and multiple appts recently  Current Medication: Outpatient Encounter Medications as of 12/13/2021  Medication Sig   benazepril (LOTENSIN) 10 MG tablet Take 1 tablet (10 mg total) by mouth daily.   empagliflozin (JARDIANCE) 10 MG TABS tablet Take 1 tablet (10 mg total) by mouth daily before breakfast.   ergocalciferol (DRISDOL) 1.25 MG (50000 UT) capsule Take 1 capsule (50,000 Units total) by mouth once a week.   glucose blood (ACCU-CHEK GUIDE) test  strip Check blood sugars BID and prn - E11.65   glyBURIDE-metformin (GLUCOVANCE) 5-500 MG tablet Take 1.5 tablets po BID or if blood sugar drops take 1 tablet po BID   Lancets Misc. (ACCU-CHEK FASTCLIX LANCET) KIT Accu-Chek Fastclix Lancet Drum   methocarbamol (ROBAXIN) 500 MG tablet Take 1 tablet (500 mg total) by mouth 2 (two) times daily as needed for muscle spasms.   predniSONE (DELTASONE) 10 MG tablet Take 10 mg by mouth daily with breakfast.   rosuvastatin (CRESTOR) 5 MG tablet Take 1 tablet by mouth twice per week at night   No facility-administered encounter medications on file as of 12/13/2021.    Surgical History: Past Surgical History:  Procedure Laterality Date   ABDOMINAL HYSTERECTOMY     ABDOMINAL SURGERY     5 tumors removed   BREAST BIOPSY Right 02/02/15 core   focal fibroadenomatoid changes and sclerosis    Medical History: Past Medical History:  Diagnosis Date   Chronic back pain    Diabetes mellitus without complication (HCC)    GERD (gastroesophageal reflux disease)    Hand tendonitis    Hypertension     Family History: Family History  Problem Relation Age of Onset   Congestive Heart Failure Other    Breast cancer Maternal Aunt 60      Review of Systems  Constitutional:  Negative for chills, fatigue and unexpected weight change.  HENT:  Negative for congestion, rhinorrhea, sneezing and sore throat.   Eyes:  Positive for visual disturbance. Negative for redness.  Respiratory:  Negative for cough, chest tightness and shortness of breath.   Cardiovascular:  Negative for chest pain and palpitations.  Gastrointestinal:  Negative for abdominal pain, constipation, diarrhea, nausea and vomiting.  Genitourinary:  Negative for dysuria and frequency.  Musculoskeletal:  Negative for arthralgias, back pain, joint swelling and neck pain.  Skin:  Negative for rash.  Neurological: Negative.  Negative for tremors and numbness.  Hematological:  Negative for  adenopathy. Does not bruise/bleed easily.  Psychiatric/Behavioral:  Negative for behavioral problems (Depression), sleep disturbance and suicidal ideas. The patient is not nervous/anxious.     Vital Signs: BP 138/88    Pulse 69    Temp 98.4 F (36.9 C)    Resp 16    Ht $R'5\' 6"'UU$  (1.676 m)    Wt 145 lb 6.4 oz (66 kg)    SpO2 98%    BMI 23.47 kg/m    Physical Exam Vitals and nursing note reviewed.  Constitutional:      General: She is not in acute distress.    Appearance: She is well-developed and normal weight. She is not diaphoretic.  HENT:     Head: Normocephalic and atraumatic.     Right Ear: External ear normal.     Left Ear: External ear normal.     Nose: Nose normal.     Mouth/Throat:     Pharynx: No oropharyngeal exudate.  Eyes:     General: No scleral icterus.       Right eye: No discharge.        Left eye: No discharge.     Conjunctiva/sclera: Conjunctivae normal.  Neck:     Thyroid: No thyromegaly.     Vascular: No JVD.     Trachea: No tracheal deviation.  Cardiovascular:     Rate and Rhythm: Normal rate and regular rhythm.     Pulses:          Dorsalis pedis pulses are 2+ on the right side and 2+ on the left side.       Posterior tibial pulses are 2+ on the right side and 2+ on the left side.     Heart sounds: Normal heart sounds. No murmur heard.   No friction rub. No gallop.  Pulmonary:     Effort: Pulmonary effort is normal. No respiratory distress.     Breath sounds: Normal breath sounds. No stridor. No wheezing or rales.  Chest:     Chest wall: No tenderness.  Breasts:    Right: Normal. No mass.     Left: Normal. No mass.  Abdominal:     General: Bowel sounds are normal. There is no distension.     Palpations: Abdomen is soft. There is no mass.     Tenderness: There is no abdominal tenderness. There is no guarding or rebound.  Musculoskeletal:        General: No tenderness or deformity. Normal range of motion.     Cervical back: Normal range of motion  and neck supple.     Right foot: Normal range of motion.     Left foot: Normal range of motion.  Feet:     Right foot:     Protective Sensation: 2 sites tested.   1 site sensed.    Skin integrity: Callus present.     Toenail Condition: Right toenails are abnormally thick. Fungal disease present.    Left foot:     Protective Sensation: 2 sites tested.   1 site sensed.    Skin integrity:  Callus present.     Toenail Condition: Left toenails are abnormally thick.  Lymphadenopathy:     Cervical: No cervical adenopathy.  Skin:    General: Skin is warm and dry.     Coloration: Skin is not pale.     Findings: No erythema or rash.  Neurological:     Mental Status: She is alert.     Cranial Nerves: No cranial nerve deficit.     Motor: No abnormal muscle tone.     Coordination: Coordination normal.     Deep Tendon Reflexes: Reflexes are normal and symmetric.  Psychiatric:        Behavior: Behavior normal.        Thought Content: Thought content normal.        Judgment: Judgment normal.     LABS: Recent Results (from the past 2160 hour(s))  Protime-INR     Status: None   Collection Time: 10/02/21 12:41 PM  Result Value Ref Range   Prothrombin Time 12.4 11.4 - 15.2 seconds   INR 0.9 0.8 - 1.2    Comment: (NOTE) INR goal varies based on device and disease states. Performed at [facility], [address] [place], [place] [address]   APTT     Status: None   Collection Time: 10/02/21 12:41 PM  Result Value Ref Range   aPTT 27 24 - 36 seconds    Comment: Performed at [facility], [address] [place], [place] [address]  CBC     Status: Abnormal   Collection Time: 10/02/21 12:41 PM  Result Value Ref Range   WBC 8.6 4.0 - 10.5 K/uL   RBC 4.96 3.87 - 5.11 MIL/uL   Hemoglobin 13.5 12.0 - 15.0 g/dL   HCT 39.9 36.0 - 46.0 %   MCV 80.4 80.0 - 100.0 fL   MCH 27.2 26.0 - 34.0 pg   MCHC 33.8 30.0 - 36.0 g/dL   RDW 16.2 (H) 11.5 - 15.5 %   Platelets 248 150 -  400 K/uL   nRBC 0.0 0.0 - 0.2 %    Comment: Performed at [facility], [address] [place], [place] [address]  Differential     Status: None   Collection Time: 10/02/21 12:41 PM  Result Value Ref Range   Neutrophils Relative % 66 %   Neutro Abs 5.8 1.7 - 7.7 K/uL   Lymphocytes Relative 27 %   Lymphs Abs 2.3 0.7 - 4.0 K/uL   Monocytes Relative 4 %   Monocytes Absolute 0.3 0.1 - 1.0 K/uL   Eosinophils Relative 1 %   Eosinophils Absolute 0.1 0.0 - 0.5 K/uL   Basophils Relative 1 %   Basophils Absolute 0.1 0.0 - 0.1 K/uL   Immature Granulocytes 1 %   Abs Immature Granulocytes 0.04 0.00 - 0.07 K/uL    Comment: Performed at [facility], [address] [place], [place] [address]  Comprehensive metabolic panel     Status: Abnormal   Collection Time: 10/02/21 12:41 PM  Result Value Ref Range   Sodium 136 135 - 145 mmol/L   Potassium 4.5 3.5 - 5.1 mmol/L   Chloride 99 98 - 111 mmol/L   CO2 25 22 - 32 mmol/L   Glucose, Bld 218 (H) 70 - 99 mg/dL    Comment: Glucose reference range applies only to samples taken after fasting for at least 8 hours.   BUN 16 6 - 20 mg/dL   Creatinine, Ser 0.60 0.44 - 1.00 mg/dL  Calcium 9.9 8.9 - 10.3 mg/dL   Total Protein 7.2 6.5 - 8.1 g/dL   Albumin 3.9 3.5 - 5.0 g/dL   AST 13 (L) 15 - 41 U/L   ALT 13 0 - 44 U/L   Alkaline Phosphatase 80 38 - 126 U/L   Total Bilirubin 0.4 0.3 - 1.2 mg/dL   GFR, Estimated >60 >60 mL/min    Comment: (NOTE) Calculated using the CKD-EPI Creatinine Equation (2021)    Anion gap 12 5 - 15    Comment: Performed at [facility], [address] [place], [place] [address]  C-reactive protein     Status: None   Collection Time: 10/02/21  3:00 PM  Result Value Ref Range   CRP <0.5 <1.0 mg/dL    Comment: Performed at [facility] [address] [place], [place] [address]  Resp Panel by RT-PCR (Flu A&B, Covid) Nasopharyngeal Swab     Status: None   Collection Time: 10/02/21  3:02 PM    Specimen: Nasopharyngeal Swab; Nasopharyngeal(NP) swabs in vial transport medium  Result Value Ref Range   SARS Coronavirus 2 by RT PCR NEGATIVE NEGATIVE    Comment: (NOTE) SARS-CoV-2 target nucleic acids are NOT DETECTED.  The SARS-CoV-2 RNA is generally detectable in upper respiratory specimens during the acute phase of infection. The lowest concentration of SARS-CoV-2 viral copies this assay can detect is 138 copies/mL. A negative result does not preclude SARS-Cov-2 infection and should not be used as the sole basis for treatment or other patient management decisions. A negative result may occur with  improper specimen collection/handling, submission of specimen other than nasopharyngeal swab, presence of viral mutation(s) within the areas targeted by this assay, and inadequate number of viral copies(<138 copies/mL). A negative result must be combined with clinical observations, patient history, and epidemiological information. The expected result is Negative.  Fact Sheet for Patients:  EntrepreneurPulse.com.au  Fact Sheet for Healthcare Providers:  IncredibleEmployment.be  This test is no t yet approved or cleared by the [place] FDA and  has been authorized for detection and/or diagnosis of SARS-CoV-2 by FDA under an Emergency Use Authorization (EUA). This EUA will remain  in effect (meaning this test can be used) for the duration of the COVID-19 declaration under Section 564(b)(1) of the Act, 21 U.S.C.section 360bbb-3(b)(1), unless the authorization is terminated  or revoked sooner.       Influenza A by PCR NEGATIVE NEGATIVE   Influenza B by PCR NEGATIVE NEGATIVE    Comment: (NOTE) The Xpert Xpress SARS-CoV-2/FLU/RSV plus assay is intended as an aid in the diagnosis of influenza from Nasopharyngeal swab specimens and should not be used as a sole basis for treatment. Nasal washings and aspirates are unacceptable for Xpert Xpress  SARS-CoV-2/FLU/RSV testing.  Fact Sheet for Patients: EntrepreneurPulse.com.au  Fact Sheet for Healthcare Providers: IncredibleEmployment.be  This test is not yet approved or cleared by the [place] FDA and has been authorized for detection and/or diagnosis of SARS-CoV-2 by FDA under an Emergency Use Authorization (EUA). This EUA will remain in effect (meaning this test can be used) for the duration of the COVID-19 declaration under Section 564(b)(1) of the Act, 21 U.S.C. section 360bbb-3(b)(1), unless the authorization is terminated or revoked.  Performed at [facility], [address] [place], [place] [address]   Sedimentation rate     Status: None   Collection Time: 10/02/21  3:19 PM  Result Value Ref Range   Sed Rate 5 0 - 30 mm/hr  Comment: Performed at [facility], [place], [place] [address]  HIV Antibody (routine testing w rflx)     Status: None   Collection Time: 10/03/21  6:28 AM  Result Value Ref Range   HIV Screen 4th Generation wRfx Non Reactive Non Reactive    Comment: Performed at [facility], [place] [address] [place], [place] [address]  CBG monitoring, ED     Status: Abnormal   Collection Time: 10/03/21  1:09 PM  Result Value Ref Range   Glucose-Capillary 118 (H) 70 - 99 mg/dL    Comment: Glucose reference range applies only to samples taken after fasting for at least 8 hours.   Comment 1 Notify RN   CSF cell count with differential collection tube #: 3     Status: None   Collection Time: 10/03/21  1:58 PM  Result Value Ref Range   Tube # 3    Color, CSF COLORLESS COLORLESS   Appearance, CSF CLEAR CLEAR   Supernatant CLEAR    RBC Count, CSF 0 0 - 3 /cu mm   WBC, CSF 5 0 - 5 /cu mm   Segmented Neutrophils-CSF 33 %   Lymphs, CSF 67 %   Monocyte-Macrophage-Spinal Fluid 0 %   Eosinophils, CSF 0 %    Comment: Performed at [facility], [address]  [place], [place] [address]  Protein and glucose, CSF     Status: Abnormal   Collection Time: 10/03/21  1:58 PM  Result Value Ref Range   Glucose, CSF 85 (H) 40 - 70 mg/dL   Total  Protein, CSF 27 15 - 45 mg/dL    Comment: Performed at [facility], [place], [place] [address]  VDRL, CSF     Status: None   Collection Time: 10/03/21  1:58 PM  Result Value Ref Range   VDRL Quant, CSF Non Reactive Non Rea:<1:1    Comment: (NOTE) Performed At: [facility] [place], [place] [id number] Rush Farmer MD [id number]   Angiotensin converting enzyme, CSF     Status: None   Collection Time: 10/03/21  1:58 PM  Result Value Ref Range   Angio Convert Enzyme <1.5 0.0 - 3.1 U/L    Comment: (NOTE) Results of this test are labeled for research purposes only by the assay's manufacturer. The performance characteristics of this assay have not been established by the manufacturer. The result should not be used for treatment or for diagnostic purposes without confirmation of the diagnosis by another medically established diagnostic product or procedure. The performance characteristics were determined by [facility]. Performed At: [facility] [place], [place] [id number] Rush Farmer MD [id number]   Cytology - Non PAP;     Status: None   Collection Time: 10/03/21  1:58 PM  Result Value Ref Range   CYTOLOGY - NON GYN      Cytology - Non PAP CASE: [id number] PATIENT: Ilze Dantuono Non-Gynecological Cytology Report     Specimen Submitted: A. CSF  Clinical History: None provided    DIAGNOSIS: A. CEREBROSPINAL FLUID; LUMBAR PUNCTURE: - NO MALIGNANCY IDENTIFIED. - RARE INFLAMMATORY CELLS WITH MILD LYMPHOCYTE PREDOMINANCE.  GROSS DESCRIPTION: A. Labeled: CSF Received: Fresh Collection time: 1:58 PM on 10/03/2021 Placed into formalin time: Not applicable Volume: Approximately 1.8 mL Description of fluid and container  in which it is received: Received in a clear plastic tube with a white screw top lid is clear, transparent fluid. Cytospin slide(s)  received: 1  Specimen material submitted for: ThinPrep  The cell block material is fixed in formalin for 6 hours prior to processing.  [facility] 10/04/2021  Final Diagnosis performed by Allena Napoleon, MD.   Electronically signed 10/07/2021 12:29:23PM The electronic signature indicates that the named Attending Pathologist has evaluated  the specimen Technical component performed at [facility], [address] [place], [place] [address] Lab: [phone] Dir: Rush Farmer, MD, MMM  Professional component performed at [facility], [facility], [address], [place], [place] [address] Lab: [phone] Dir: Kathi Simpers, MD   Neuromyelitis optica Raynelle [place], IgG     Status: None   Collection Time: 10/03/21  2:45 PM  Result Value Ref Range   NMO-IgG <1.5 0.0 - 3.0 U/mL    Comment: (NOTE)                             Negative:      0.0 - 3.0                             Positive:           >3.0 Performed At: [facility] [place], [place] [id number] Rush Farmer MD [id number]   Angiotensin converting enzyme     Status: None   Collection Time: 10/03/21  2:45 PM  Result Value Ref Range   Angiotensin-Converting Enzyme 32 14 - 82 U/L    Comment: (NOTE) Performed At: [facility] [place], [place] [id number] Rush Farmer MD [id number]   RPR     Status: None   Collection Time: 10/03/21  2:45 PM  Result Value Ref Range   RPR Ser Ql NON REACTIVE NON REACTIVE    Comment: Performed at [facility], [address], [place], [place] [address]  ANA     Status: None   Collection Time: 10/03/21  2:45 PM  Result Value Ref Range   Anti Nuclear Antibody (ANA) Negative Negative    Comment: (NOTE) Performed At: [facility] [place], [place] [id number] Rush Farmer MD  [id number]   IgG CSF index     Status: None   Collection Time: 10/03/21  2:46 PM  Result Value Ref Range   IgG, CSF 1.3 0.0 - 6.7 mg/dL   Albumin CSF-mCnc 18 8 - 37 mg/dL   IgG (Immunoglobin G), Serum 643 586 - 1,602 mg/dL   Albumin 4.2 3.8 - 4.9 g/dL   IgG/Alb Ratio, CSF 0.07 0.00 - 0.25   CSF IgG Index 0.5 0.0 - 0.7    Comment: (NOTE) Performed At: [facility] [address] [place], [place] [id number] Rush Farmer MD [id number]   Oligoclonal bands, CSF + serum     Status: None   Collection Time: 10/03/21  2:46 PM  Result Value Ref Range   CSF Oligoclonal Bands Comment     Comment: (NOTE) Zero (0) oligoclonal bands were observed in the CSF. However one (1) paired band was observed in both the CSF and serum. Paired bands suggest an immune response to an inflammatory process outside the CNS and are unlikely to represent a CNS demyelinating disease. Interpretation: Criteria for Positivity: Four (4) or more oligoclonal bands observed  only in the CSF have been shown to be most consistent with MS  using our method. [Fortini AS, Baird Cancer EL, Weinshenker BG, and Katzmann JA: Cerebrospinal Fluid Oligoclonal Bands  in the Diagnosis  of Multiple Sclerosis. Am J Clin Pathol 120(5):672-675, 2003]. Oligoclonal bands that are present only in the CSF have been associated with a variety of inflammatory brain diseases such as multiple sclerosis (MS), subacute encephalitis, neurosyphilis, etc.  Increased IgG in the CSF is not specific for MS, but is an indication  of chronic neural inflammation. Clinical correlation indicated. Approximately 2-3% of clinically confi rmed MS patients show little or no evidence of oligoclonal bands in the CSF; however oligoclonal  bands may develop as the disease progresses. Oligoclonal Banding testing performed using Isoelectric Focusing (IEF) and immunoblotting methodology. Performed At: [facility] [place], [place]  [id number] Rush Farmer MD [id number]   Glucose, capillary     Status: Abnormal   Collection Time: 10/04/21  6:40 PM  Result Value Ref Range   Glucose-Capillary >600 (HH) 70 - 99 mg/dL    Comment: Glucose reference range applies only to samples taken after fasting for at least 8 hours.  Basic metabolic panel     Status: Abnormal   Collection Time: 10/04/21  7:10 PM  Result Value Ref Range   Sodium 128 (L) 135 - 145 mmol/L    Comment: RESULTS VERIFIED BY REPEAT TESTING DLB   Potassium 5.6 (H) 3.5 - 5.1 mmol/L   Chloride 92 (L) 98 - 111 mmol/L   CO2 24 22 - 32 mmol/L   Glucose, Bld 870 (HH) 70 - 99 mg/dL    Comment: CRITICAL RESULT CALLED TO, READ BACK BY AND VERIFIED WITH RACHEL CRESSMAN 1940 10/04/2021 DLB Glucose reference range applies only to samples taken after fasting for at least 8 hours.    BUN 27 (H) 6 - 20 mg/dL   Creatinine, Ser 1.04 (H) 0.44 - 1.00 mg/dL   Calcium 9.5 8.9 - 10.3 mg/dL   GFR, Estimated >60 >60 mL/min    Comment: (NOTE) Calculated using the CKD-EPI Creatinine Equation (2021)    Anion gap 12 5 - 15    Comment: Performed at [facility], [address] [place],  [address]  Glucose, capillary     Status: Abnormal   Collection Time: 10/04/21  9:13 PM  Result Value Ref Range   Glucose-Capillary 518 (HH) 70 - 99 mg/dL    Comment: Glucose reference range applies only to samples taken after fasting for at least 8 hours.   Comment 1 Notify RN   Glucose, capillary     Status: Abnormal   Collection Time: 10/05/21 12:07 AM  Result Value Ref Range   Glucose-Capillary 263 (H) 70 - 99 mg/dL    Comment: Glucose reference range applies only to samples taken after fasting for at least 8 hours.  Glucose, capillary     Status: Abnormal   Collection Time: 10/05/21  1:42 AM  Result Value Ref Range   Glucose-Capillary 237 (H) 70 - 99 mg/dL    Comment: Glucose reference range applies only to samples taken after fasting for at least 8 hours.   Glucose, capillary     Status: Abnormal   Collection Time: 10/05/21  7:34 AM  Result Value Ref Range   Glucose-Capillary 277 (H) 70 - 99 mg/dL    Comment: Glucose reference range applies only to samples taken after fasting for at least 8 hours.  Basic metabolic panel     Status: Abnormal   Collection Time: 10/05/21 11:26 AM  Result Value Ref Range   Sodium 132 (L) 135 - 145 mmol/L   Potassium 4.3 3.5 - 5.1  mmol/L   Chloride 96 (L) 98 - 111 mmol/L   CO2 29 22 - 32 mmol/L   Glucose, Bld 300 (H) 70 - 99 mg/dL    Comment: Glucose reference range applies only to samples taken after fasting for at least 8 hours.   BUN 22 (H) 6 - 20 mg/dL   Creatinine, Ser 0.63 0.44 - 1.00 mg/dL   Calcium 9.7 8.9 - 10.3 mg/dL   GFR, Estimated >60 >60 mL/min    Comment: (NOTE) Calculated using the CKD-EPI Creatinine Equation (2021)    Anion gap 7 5 - 15    Comment: Performed at [facility], [address] [place], [place] [address]  Glucose, capillary     Status: Abnormal   Collection Time: 10/05/21 11:46 AM  Result Value Ref Range   Glucose-Capillary 256 (H) 70 - 99 mg/dL    Comment: Glucose reference range applies only to samples taken after fasting for at least 8 hours.  Glucose, capillary     Status: Abnormal   Collection Time: 10/05/21  5:48 PM  Result Value Ref Range   Glucose-Capillary 356 (H) 70 - 99 mg/dL    Comment: Glucose reference range applies only to samples taken after fasting for at least 8 hours.  Glucose, capillary     Status: Abnormal   Collection Time: 10/05/21  9:33 PM  Result Value Ref Range   Glucose-Capillary 345 (H) 70 - 99 mg/dL    Comment: Glucose reference range applies only to samples taken after fasting for at least 8 hours.  Glucose, capillary     Status: Abnormal   Collection Time: 10/06/21  7:29 AM  Result Value Ref Range   Glucose-Capillary 222 (H) 70 - 99 mg/dL    Comment: Glucose reference range applies only to samples taken after fasting for at  least 8 hours.  Glucose, capillary     Status: Abnormal   Collection Time: 10/06/21 11:46 AM  Result Value Ref Range   Glucose-Capillary 360 (H) 70 - 99 mg/dL    Comment: Glucose reference range applies only to samples taken after fasting for at least 8 hours.  Glucose, capillary     Status: Abnormal   Collection Time: 10/06/21  4:39 PM  Result Value Ref Range   Glucose-Capillary 439 (H) 70 - 99 mg/dL    Comment: Glucose reference range applies only to samples taken after fasting for at least 8 hours.  Basic metabolic panel     Status: Abnormal   Collection Time: 10/06/21  5:24 PM  Result Value Ref Range   Sodium 131 (L) 135 - 145 mmol/L   Potassium 4.9 3.5 - 5.1 mmol/L   Chloride 96 (L) 98 - 111 mmol/L   CO2 27 22 - 32 mmol/L   Glucose, Bld 476 (H) 70 - 99 mg/dL    Comment: Glucose reference range applies only to samples taken after fasting for at least 8 hours.   BUN 28 (H) 6 - 20 mg/dL   Creatinine, Ser 1.12 (H) 0.44 - 1.00 mg/dL   Calcium 9.3 8.9 - 10.3 mg/dL   GFR, Estimated 56 (L) >60 mL/min    Comment: (NOTE) Calculated using the CKD-EPI Creatinine Equation (2021)    Anion gap 8 5 - 15    Comment: Performed at [facility], [address] [place], [place] [address]  Glucose, capillary     Status: Abnormal   Collection Time: 10/06/21  9:07 PM  Result Value Ref Range   Glucose-Capillary 351 (  H) 70 - 99 mg/dL    Comment: Glucose reference range applies only to samples taken after fasting for at least 8 hours.  Glucose, capillary     Status: Abnormal   Collection Time: 10/07/21  7:36 AM  Result Value Ref Range   Glucose-Capillary 150 (H) 70 - 99 mg/dL    Comment: Glucose reference range applies only to samples taken after fasting for at least 8 hours.  Glucose, capillary     Status: Abnormal   Collection Time: 10/07/21 11:32 AM  Result Value Ref Range   Glucose-Capillary 290 (H) 70 - 99 mg/dL    Comment: Glucose reference range applies only to samples taken  after fasting for at least 8 hours.  Glucose, capillary     Status: Abnormal   Collection Time: 10/07/21  4:45 PM  Result Value Ref Range   Glucose-Capillary 149 (H) 70 - 99 mg/dL    Comment: Glucose reference range applies only to samples taken after fasting for at least 8 hours.   Comment 1 Notify RN   Glucose, capillary     Status: Abnormal   Collection Time: 10/07/21  8:34 PM  Result Value Ref Range   Glucose-Capillary 341 (H) 70 - 99 mg/dL    Comment: Glucose reference range applies only to samples taken after fasting for at least 8 hours.   Comment 1 Notify RN   Glucose, capillary     Status: Abnormal   Collection Time: 10/08/21  8:04 AM  Result Value Ref Range   Glucose-Capillary 158 (H) 70 - 99 mg/dL    Comment: Glucose reference range applies only to samples taken after fasting for at least 8 hours.  Glucose, capillary     Status: Abnormal   Collection Time: 10/08/21 11:26 AM  Result Value Ref Range   Glucose-Capillary 314 (H) 70 - 99 mg/dL    Comment: Glucose reference range applies only to samples taken after fasting for at least 8 hours.   Comment 1 Notify RN   POCT HgB A1C     Status: Abnormal   Collection Time: 12/13/21 10:16 AM  Result Value Ref Range   Hemoglobin A1C 8.7 (A) 4.0 - 5.6 %   HbA1c POC (<> result, manual entry)     HbA1c, POC (prediabetic range)     HbA1c, POC (controlled diabetic range)    UA/M w/rflx Culture, Routine     Status: Abnormal   Collection Time: 12/13/21 10:30 AM   Specimen: Urine   Urine  Result Value Ref Range   Specific Gravity, UA      >=1.030 (A) 1.005 - 1.030   pH, UA 5.0 5.0 - 7.5   Color, UA Yellow Yellow   Appearance Ur Clear Clear   Leukocytes,UA 1+ (A) Negative   Protein,UA Negative Negative/Trace   Glucose, UA 3+ (A) Negative   Ketones, UA Trace (A) Negative   RBC, UA Negative Negative   Bilirubin, UA Negative Negative   Urobilinogen, Ur 0.2 0.2 - 1.0 mg/dL   Nitrite, UA Negative Negative   Microscopic Examination  See below:     Comment: Microscopic was indicated and was performed.   Urinalysis Reflex Comment     Comment: This specimen has reflexed to a Urine Culture.  Microscopic Examination     Status: Abnormal   Collection Time: 12/13/21 10:30 AM   Urine  Result Value Ref Range   WBC, UA >30 (A) 0 - 5 /hpf   RBC 0-2 0 - 2 /hpf  Epithelial Cells (non renal) 0-10 0 - 10 /hpf   Casts None seen None seen /lpf   Bacteria, UA None seen None seen/Few  Urine Culture, Reflex     Status: None   Collection Time: 12/13/21 10:30 AM   Urine  Result Value Ref Range   Urine Culture, Routine Final report    Organism ID, Bacteria Comment     Comment: Mixed urogenital flora Less than 10,000 colonies/mL         Assessment/Plan: 1. Encounter for general adult medical examination with abnormal findings CPE performed, UTD on routine fasting labs and PHM except colonoscopy--will need to consider after start of the new year  2. Type 2 diabetes mellitus with hyperglycemia, without long-term current use of insulin (HCC) - POCT HgB A1C is increased to 8.7 from 7.7 last visit, likely due to steroids use from loss of vision. Patient states she is goin got be stopping this in 1 week therefore will hold off adjusting meds, especially given some recent lows. Will work on keeping consistent diet to avoid extreme highs and lows. Pt will call if she is going to be continued on another round of steroids or if significant sugar readings as meds may need to be adjusted in that case  3. Essential hypertension Stable, continue current medications  4. Optic neuritis Followed by opthalmology  5. Mixed hyperlipidemia Will start on crestor twice per week, consider carotid US in future - rosuvastatin (CRESTOR) 5 MG tablet; Take 1 tablet by mouth twice per week at night  Dispense: 90 tablet; Refill: 3  6. Dysuria -UA/M w/rflx Culture, Routine  7. Visit for gynecologic examination Breast exam performed  General  Counseling: icie kuznicki understanding of the findings of todays visit and agrees with plan of treatment. I have discussed any further diagnostic evaluation that may be needed or ordered today. We also reviewed her medications today. she has been encouraged to call the office with any questions or concerns that should arise related to todays visit.    Counseling:    Orders Placed This Encounter  Procedures   Microscopic Examination   Urine Culture, Reflex   UA/M w/rflx Culture, Routine   POCT HgB A1C    Meds ordered this encounter  Medications   rosuvastatin (CRESTOR) 5 MG tablet    Sig: Take 1 tablet by mouth twice per week at night    Dispense:  90 tablet    Refill:  3    This patient was seen by Drema Dallas, PA-C in collaboration with Dr. Clayborn Bigness as a part of collaborative care agreement.  Total time spent:35 Minutes  Time spent includes review of chart, medications, test results, and follow up plan with the patient.     Lavera Guise, MD  Internal Medicine

## 2021-12-17 LAB — UA/M W/RFLX CULTURE, ROUTINE
Bilirubin, UA: NEGATIVE
Nitrite, UA: NEGATIVE
Protein,UA: NEGATIVE
RBC, UA: NEGATIVE
Specific Gravity, UA: >=1.03 — AB (ref 1.005–1.030)
Urobilinogen, Ur: 0.2 mg/dL (ref 0.2–1.0)
pH, UA: 5 (ref 5.0–7.5)

## 2021-12-17 LAB — MICROSCOPIC EXAMINATION
Bacteria, UA: NONE SEEN
Casts: NONE SEEN /lpf
WBC, UA: >30 /hpf — AB (ref 0–5)

## 2021-12-17 LAB — URINE CULTURE, REFLEX

## 2021-12-19 ENCOUNTER — Encounter (HOSPITAL_COMMUNITY): Payer: Self-pay | Admitting: Radiology

## 2022-01-14 DIAGNOSIS — G378 Other specified demyelinating diseases of central nervous system: Secondary | ICD-10-CM | POA: Diagnosis not present

## 2022-01-14 DIAGNOSIS — H469 Unspecified optic neuritis: Secondary | ICD-10-CM | POA: Diagnosis not present

## 2022-01-20 ENCOUNTER — Other Ambulatory Visit: Payer: Self-pay | Admitting: Physician Assistant

## 2022-01-20 DIAGNOSIS — E1165 Type 2 diabetes mellitus with hyperglycemia: Secondary | ICD-10-CM

## 2022-01-20 DIAGNOSIS — E113393 Type 2 diabetes mellitus with moderate nonproliferative diabetic retinopathy without macular edema, bilateral: Secondary | ICD-10-CM | POA: Diagnosis not present

## 2022-01-23 DIAGNOSIS — G378 Other specified demyelinating diseases of central nervous system: Secondary | ICD-10-CM | POA: Diagnosis not present

## 2022-01-31 ENCOUNTER — Other Ambulatory Visit: Payer: Self-pay

## 2022-01-31 ENCOUNTER — Other Ambulatory Visit: Payer: Self-pay | Admitting: Physician Assistant

## 2022-01-31 DIAGNOSIS — E1165 Type 2 diabetes mellitus with hyperglycemia: Secondary | ICD-10-CM

## 2022-01-31 DIAGNOSIS — I10 Essential (primary) hypertension: Secondary | ICD-10-CM

## 2022-01-31 MED ORDER — GLYBURIDE-METFORMIN 5-500 MG PO TABS: ORAL_TABLET | ORAL | 1 refills | Status: DC

## 2022-01-31 MED ORDER — BENAZEPRIL HCL 10 MG PO TABS: 10.0000 mg | ORAL_TABLET | Freq: Every day | ORAL | 3 refills | Status: DC

## 2022-01-31 MED ORDER — ACCU-CHEK FASTCLIX LANCETS MISC: 1 refills | Status: DC

## 2022-01-31 MED ORDER — EMPAGLIFLOZIN 10 MG PO TABS: 10.0000 mg | ORAL_TABLET | Freq: Every day | ORAL | 2 refills | Status: DC

## 2022-01-31 MED ORDER — PRAVASTATIN SODIUM 10 MG PO TABS: 10.0000 mg | ORAL_TABLET | Freq: Every day | ORAL | 1 refills | Status: DC

## 2022-01-31 MED ORDER — ACCU-CHEK GUIDE VI STRP: ORAL_STRIP | 3 refills | Status: DC

## 2022-01-31 NOTE — Telephone Encounter (Signed)
Pt called and asked to send her medications to [facility] [address] [address] die to her insurance requiring [facility].  Pt advised that she had cramps in her feet and hands with the crestor and she advised that Leotis Shames has told her she would change it to something else if not working.  Per Lauren she changed to pravastatin 10 mg once daily.  Pt informed.

## 2022-02-02 ENCOUNTER — Telehealth: Payer: Self-pay

## 2022-02-02 NOTE — Telephone Encounter (Signed)
PA sent for glyburide-metformin 5-500 mg and for Jardiance 10 mg on 02/02/22 at 726pm

## 2022-02-03 ENCOUNTER — Telehealth: Payer: Self-pay

## 2022-02-03 NOTE — Telephone Encounter (Signed)
As discussed

## 2022-02-03 NOTE — Telephone Encounter (Signed)
error 

## 2022-02-05 ENCOUNTER — Telehealth: Payer: Self-pay

## 2022-02-05 NOTE — Telephone Encounter (Signed)
error 

## 2022-02-07 ENCOUNTER — Other Ambulatory Visit: Payer: Self-pay

## 2022-02-07 DIAGNOSIS — E1165 Type 2 diabetes mellitus with hyperglycemia: Secondary | ICD-10-CM

## 2022-02-07 MED ORDER — GLIPIZIDE-METFORMIN HCL 5-500 MG PO TABS: ORAL_TABLET | ORAL | 1 refills | Status: DC

## 2022-02-10 ENCOUNTER — Telehealth: Payer: Self-pay

## 2022-02-12 ENCOUNTER — Other Ambulatory Visit: Payer: Self-pay

## 2022-02-12 MED ORDER — GLYBURIDE-METFORMIN 5-500 MG PO TABS: 1.0000 | ORAL_TABLET | Freq: Two times a day (BID) | ORAL | 1 refills | Status: DC

## 2022-02-12 NOTE — Telephone Encounter (Signed)
ERROR

## 2022-02-12 NOTE — Telephone Encounter (Signed)
Spoke to pt and informed her that the insurance approved the PA for the GLYBURIDE/METFORMIN 5-500 MG that she was on before and is valid from 02/12/22 to 02/12/23, sent new rx to pharmacy

## 2022-03-07 ENCOUNTER — Encounter: Payer: Self-pay | Admitting: Physician Assistant

## 2022-03-07 ENCOUNTER — Other Ambulatory Visit: Payer: Self-pay

## 2022-03-07 ENCOUNTER — Ambulatory Visit: Payer: 59 | Admitting: Physician Assistant

## 2022-03-07 ENCOUNTER — Other Ambulatory Visit: Payer: Self-pay | Admitting: Physician Assistant

## 2022-03-07 VITALS — BP 140/83 | HR 62 | Temp 98.0°F | Resp 16 | Ht 66.0 in | Wt 151.2 lb

## 2022-03-07 DIAGNOSIS — I10 Essential (primary) hypertension: Secondary | ICD-10-CM | POA: Diagnosis not present

## 2022-03-07 DIAGNOSIS — E782 Mixed hyperlipidemia: Secondary | ICD-10-CM | POA: Diagnosis not present

## 2022-03-07 DIAGNOSIS — E1165 Type 2 diabetes mellitus with hyperglycemia: Secondary | ICD-10-CM

## 2022-03-07 DIAGNOSIS — H469 Unspecified optic neuritis: Secondary | ICD-10-CM | POA: Diagnosis not present

## 2022-03-07 LAB — POCT GLYCOSYLATED HEMOGLOBIN (HGB A1C): Hemoglobin A1C: 8.4 % — AB (ref 4.0–5.6)

## 2022-03-07 MED ORDER — GLYBURIDE-METFORMIN 5-500 MG PO TABS: 1.0000 | ORAL_TABLET | Freq: Two times a day (BID) | ORAL | 1 refills | Status: DC

## 2022-03-07 MED ORDER — SAXAGLIPTIN HCL 2.5 MG PO TABS: 2.5000 mg | ORAL_TABLET | Freq: Every day | ORAL | 2 refills | Status: DC

## 2022-03-07 NOTE — Progress Notes (Signed)
[facility] ?[address] ?[place], [place] [address] ? ?Internal MEDICINE  ?Office Visit Note ? ?Patient Name: Regina JamesWanda W Short ? [id number]  ?[id number] ? ?Date of Service: 03/07/2022 ? ?Chief Complaint  ?Patient presents with  ? Follow-up  ? Diabetes  ? Gastroesophageal Reflux  ? Hypertension  ? Knee Pain  ?  Right knee pain - started last week when mowing the yard  ? Medication Refill  ?  Needs Glyburide-Metformin - 5-500mg   ? Quality Metric Gaps  ?  Foot Exam  ? ? ?HPI ?Patient is here for routine follow-up ?-Right knee pain since mowing last week, was a little swollen and she elevated it and it got better.  She is able to bend his knee normally and bear weight.  Patient will call if symptoms begin to worsen again however at this time seems to be greatly improved ?-She reports that her [facility] ophthalmologist prescribed a medication that made her swell and she stopped it. This was gabapentin and she will follow back up with them. ?-Still has right vision loss and they plan to do treatment in June for this ?-BG 130-200 in a.m., has fluctuated for the past few months. Was taking steroids through Jan due to vision loss. And then insurance changed and would not cover many medications ?-Marcelline DeistFarxiga never tried due to cost on prior insurance, but was able to try jardiance but unfortunately had reaction and stopped this ?- She has been taking glyburide-metformin. She was supposed to be taking 1.5 tablets twice a day however she does mention that she increase to 2 tablets twice a day for a few days when her sugars were running high.  She does mention that in a medication she was on prior to her last conference worked well for her.  On chart review this was Onglyza and she is interested in restarting on this to see if new insurance will cover this now.  Discussed adding low-dose Onglyza while continuing with only the 1.5 tabs of glyburide-metformin twice daily ? ?Current Medication: ?Outpatient Encounter Medications as  of 03/07/2022  ?Medication Sig  ? Accu-Chek FastClix Lancets MISC Use as directed to check blood sugars twice a day  E11.65  ? benazepril (LOTENSIN) 10 MG tablet Take 1 tablet (10 mg total) by mouth daily.  ? Cholecalciferol 125 MCG (5000 UT) capsule Take by mouth.  ? glucose blood (ACCU-CHEK GUIDE) test strip Check blood sugars BID and prn - E11.65  ? methocarbamol (ROBAXIN) 500 MG tablet Take 1 tablet (500 mg total) by mouth 2 (two) times daily as needed for muscle spasms.  ? pravastatin (PRAVACHOL) 10 MG tablet Take 1 tablet (10 mg total) by mouth daily.  ? saxagliptin HCl (ONGLYZA) 2.5 MG TABS tablet Take 1 tablet (2.5 mg total) by mouth daily.  ? [DISCONTINUED] empagliflozin (JARDIANCE) 10 MG TABS tablet Take by mouth.  ? [DISCONTINUED] empagliflozin (JARDIANCE) 10 MG TABS tablet Take 1 tablet (10 mg total) by mouth daily before breakfast.  ? [DISCONTINUED] glyBURIDE-metformin (GLUCOVANCE) 5-500 MG tablet Take 1 tablet by mouth 2 (two) times daily with a meal. TAKE 1.5 TABLETS BY MOUTH TWICE DAILY OR IF BLOOD SUGAR DROPS TAKE 1 TABLET TWICE DAIL  ? gabapentin (NEURONTIN) 300 MG capsule Take 300 mg by mouth 3 (three) times daily. (Patient not taking: Reported on 03/07/2022)  ? glyBURIDE-metformin (GLUCOVANCE) 5-500 MG tablet Take 1 tablet by mouth 2 (two) times daily with a meal. TAKE 1.5 TABLETS BY MOUTH TWICE DAILY OR IF BLOOD SUGAR DROPS  TAKE 1 TABLET TWICE DAIL  ? [DISCONTINUED] glipiZIDE-metformin (METAGLIP) 5-500 MG tablet Take 1 tablet by mouth daily (Patient not taking: Reported on 03/07/2022)  ? ?No facility-administered encounter medications on file as of 03/07/2022.  ? ? ?Surgical History: ?Past Surgical History:  ?Procedure Laterality Date  ? ABDOMINAL HYSTERECTOMY    ? ABDOMINAL SURGERY    ? 5 tumors removed  ? BREAST BIOPSY Right 02/02/15 core  ? focal fibroadenomatoid changes and sclerosis  ? ? ?Medical History: ?Past Medical History:  ?Diagnosis Date  ? Chronic back pain   ? Diabetes mellitus without  complication (HCC)   ? GERD (gastroesophageal reflux disease)   ? Hand tendonitis   ? Hypertension   ? ? ?Family History: ?Family History  ?Problem Relation Age of Onset  ? Congestive Heart Failure Other   ? Breast cancer Maternal Aunt 60  ? ? ?Social History  ? ?Socioeconomic History  ? Marital status: Single  ?  Spouse name: Not on file  ? Number of children: Not on file  ? Years of education: Not on file  ? Highest education level: Not on file  ?Occupational History  ? Not on file  ?Tobacco Use  ? Smoking status: Former  ?  Types: Cigarettes  ?  Quit date: 02/26/2021  ?  Years since quitting: 1.0  ? Smokeless tobacco: Never  ? Tobacco comments:  ?  Quit  ?Substance and Sexual Activity  ? Alcohol use: Yes  ?  Alcohol/week: 0.0 standard drinks  ?  Comment: holidays  ? Drug use: No  ? Sexual activity: Yes  ?  Birth control/protection: None  ?Other Topics Concern  ? Not on file  ?Social History Narrative  ? Not on file  ? ?Social Determinants of Health  ? ?Financial Resource Strain: Not on file  ?Food Insecurity: Not on file  ?Transportation Needs: Not on file  ?Physical Activity: Not on file  ?Stress: Not on file  ?Social Connections: Not on file  ?Intimate Partner Violence: Not on file  ? ? ? ? ?Review of Systems  ?Constitutional:  Negative for chills, fatigue and unexpected weight change.  ?HENT:  Negative for congestion, rhinorrhea, sneezing and sore throat.   ?Eyes:  Positive for visual disturbance. Negative for redness.  ?Respiratory:  Negative for cough, chest tightness and shortness of breath.   ?Cardiovascular:  Negative for chest pain and palpitations.  ?Gastrointestinal:  Negative for abdominal pain, constipation, diarrhea, nausea and vomiting.  ?Genitourinary:  Negative for dysuria and frequency.  ?Musculoskeletal:  Positive for arthralgias. Negative for back pain, joint swelling and neck pain.  ?Skin:  Negative for rash.  ?Neurological: Negative.  Negative for tremors and numbness.  ?Hematological:   Negative for adenopathy. Does not bruise/bleed easily.  ?Psychiatric/Behavioral:  Negative for behavioral problems (Depression), sleep disturbance and suicidal ideas. The patient is not nervous/anxious.   ? ?Vital Signs: ?BP 140/83   Pulse 62   Temp 98 ?F (36.7 ?C)   Resp 16   Ht 5\' 6"  (1.676 m)   Wt 151 lb 3.2 oz (68.6 kg)   SpO2 96%   BMI 24.40 kg/m?  ? ? ?Physical Exam ?Vitals and nursing note reviewed.  ?Constitutional:   ?   General: She is not in acute distress. ?   Appearance: She is well-developed and normal weight. She is not diaphoretic.  ?HENT:  ?   Head: Normocephalic and atraumatic.  ?   Mouth/Throat:  ?   Pharynx: No oropharyngeal exudate.  ?Neck:  ?  Thyroid: No thyromegaly.  ?   Vascular: No JVD.  ?   Trachea: No tracheal deviation.  ?Cardiovascular:  ?   Rate and Rhythm: Normal rate and regular rhythm.  ?   Heart sounds: Normal heart sounds. No murmur heard. ?  No friction rub. No gallop.  ?Pulmonary:  ?   Effort: Pulmonary effort is normal. No respiratory distress.  ?   Breath sounds: No wheezing or rales.  ?Chest:  ?   Chest wall: No tenderness.  ?Abdominal:  ?   General: Bowel sounds are normal.  ?   Palpations: Abdomen is soft.  ?Musculoskeletal:     ?   General: No swelling or tenderness. Normal range of motion.  ?   Cervical back: Normal range of motion and neck supple.  ?   Right lower leg: No edema.  ?   Left lower leg: No edema.  ?Lymphadenopathy:  ?   Cervical: No cervical adenopathy.  ?Skin: ?   General: Skin is warm and dry.  ?Neurological:  ?   Mental Status: She is alert and oriented to person, place, and time.  ?   Cranial Nerves: No cranial nerve deficit.  ?Psychiatric:     ?   Behavior: Behavior normal.     ?   Thought Content: Thought content normal.     ?   Judgment: Judgment normal.  ? ? ? ? ? ?Assessment/Plan: ?1. Type 2 diabetes mellitus with hyperglycemia, without long-term current use of insulin (HCC) ?- POCT HgB A1C is 8.4 which is slightly improved from 8.7 at last  check.  Sugars are elevated due to recent steroid use and change in medications due to insurance changing.  We will go ahead and continue with 1.5 tabs glyburide-metformin twice per day and will add 2.5 mg of Ongl

## 2022-03-07 NOTE — Telephone Encounter (Signed)
Please review

## 2022-03-09 ENCOUNTER — Telehealth: Payer: Self-pay

## 2022-03-09 NOTE — Telephone Encounter (Signed)
PA for ONGLYZA 2.5 mg sent 03/09/22 @ 11:55 pm ?

## 2022-03-12 ENCOUNTER — Telehealth: Payer: Self-pay

## 2022-03-12 NOTE — Telephone Encounter (Signed)
Called and spoke to pt and informed her that the ONGLYZA 2.5 mg was not covered on her insurance.  Per DFK I informed pt to keep taking the GLYBURIDE-METFORMIN 5-500 mg tablets for now and once we figure out what she can take I will let her know.  Pt advised to watch her BS's and what she eats to keep her BS from elevating to high.  Pt understood ?

## 2022-05-29 DIAGNOSIS — G378 Other specified demyelinating diseases of central nervous system: Secondary | ICD-10-CM | POA: Diagnosis not present

## 2022-06-04 ENCOUNTER — Other Ambulatory Visit: Payer: Self-pay | Admitting: Physician Assistant

## 2022-06-04 DIAGNOSIS — E1165 Type 2 diabetes mellitus with hyperglycemia: Secondary | ICD-10-CM

## 2022-06-06 ENCOUNTER — Telehealth: Payer: Self-pay

## 2022-06-06 ENCOUNTER — Encounter: Payer: Self-pay | Admitting: Physician Assistant

## 2022-06-06 ENCOUNTER — Ambulatory Visit: Payer: 59 | Admitting: Physician Assistant

## 2022-06-06 VITALS — BP 145/89 | HR 77 | Temp 98.0°F | Resp 16 | Ht 66.0 in | Wt 147.0 lb

## 2022-06-06 DIAGNOSIS — I10 Essential (primary) hypertension: Secondary | ICD-10-CM | POA: Diagnosis not present

## 2022-06-06 DIAGNOSIS — E1165 Type 2 diabetes mellitus with hyperglycemia: Secondary | ICD-10-CM | POA: Diagnosis not present

## 2022-06-06 DIAGNOSIS — H469 Unspecified optic neuritis: Secondary | ICD-10-CM

## 2022-06-06 LAB — POCT GLYCOSYLATED HEMOGLOBIN (HGB A1C): Hemoglobin A1C: 10.6 % — AB (ref 4.0–5.6)

## 2022-06-06 MED ORDER — GLYBURIDE-METFORMIN 5-500 MG PO TABS: 1.0000 | ORAL_TABLET | Freq: Two times a day (BID) | ORAL | 1 refills | Status: DC

## 2022-06-06 NOTE — Progress Notes (Signed)
[facility] [address] [place], [place] [address]  Internal MEDICINE  Office Visit Note  Patient Name: Regina Short  [id number]  [id number]  Date of Service: 06/20/2022  Chief Complaint  Patient presents with   Follow-up   Diabetes   Gastroesophageal Reflux   Hypertension   GI Problem    Having bloating and constipation - drinking enough water, eating less   Quality Metric Gaps    Foot Exam    HPI Pt is here for routine follow up -BG in Am have been 170-270 -Has been taking 1.5 tabs  of glucovance twice per day. Onglyza was not covered. -will start taking 2 tabs of glucovance twice daily for total for 20mg  glyburide and 2000 metformin daily. She is aware that this is the max dose and that additional alternative will be needed as her A1c and sugars have been significantly elevated. -does not want to take any injections, especially not daily insulin. Discussed that if no other oral options affordable/ if sugars continue to be elevated then she will need to start insulin and/or be referred to endocrinology for management -will call her insurance about coverage options and let the office know -patient is still having trouble with eye sight and seeing neurology now. Determining next steps and thinks she may be starting infusions if approved.  Current Medication: Outpatient Encounter Medications as of 06/06/2022  Medication Sig   Accu-Chek FastClix Lancets MISC Use as directed to check blood sugars twice a day  E11.65   benazepril (LOTENSIN) 10 MG tablet Take 1 tablet (10 mg total) by mouth daily.   Cholecalciferol 125 MCG (5000 UT) capsule Take by mouth.   gabapentin (NEURONTIN) 300 MG capsule Take 300 mg by mouth 3 (three) times daily.   glucose blood (ACCU-CHEK GUIDE) test strip USE TO TEST BLOOD SUGARS TWICE A DAY AS NEEDED E11.65   methocarbamol (ROBAXIN) 500 MG tablet Take 1 tablet (500 mg total) by mouth 2 (two) times daily as needed for muscle spasms.    pravastatin (PRAVACHOL) 10 MG tablet Take 1 tablet (10 mg total) by mouth daily.   [DISCONTINUED] glyBURIDE-metformin (GLUCOVANCE) 5-500 MG tablet Take 1 tablet by mouth 2 (two) times daily with a meal. TAKE 1.5 TABLETS BY MOUTH TWICE DAILY OR IF BLOOD SUGAR DROPS TAKE 1 TABLET TWICE DAIL   [DISCONTINUED] saxagliptin HCl (ONGLYZA) 2.5 MG TABS tablet Take 1 tablet (2.5 mg total) by mouth daily.   glyBURIDE-metformin (GLUCOVANCE) 5-500 MG tablet TAKE 2 TABLETS BY MOUTH TWICE DAILY   [DISCONTINUED] glyBURIDE-metformin (GLUCOVANCE) 5-500 MG tablet Take 1 tablet by mouth 2 (two) times daily with a meal. TAKE 2 TABLETS BY MOUTH TWICE DAILY OR IF BLOOD SUGAR DROPS TAKE 1 TABLET TWICE DAILY   No facility-administered encounter medications on file as of 06/06/2022.    Surgical History: Past Surgical History:  Procedure Laterality Date   ABDOMINAL HYSTERECTOMY     ABDOMINAL SURGERY     5 tumors removed   BREAST BIOPSY Right 02/02/15 core   focal fibroadenomatoid changes and sclerosis    Medical History: Past Medical History:  Diagnosis Date   Chronic back pain    Diabetes mellitus without complication (HCC)    GERD (gastroesophageal reflux disease)    Hand tendonitis    Hypertension     Family History: Family History  Problem Relation Age of Onset   Congestive Heart Failure Other    Breast cancer Maternal Aunt 58    Social History   Socioeconomic History  Marital status: Single    Spouse name: Not on file   Number of children: Not on file   Years of education: Not on file   Highest education level: Not on file  Occupational History   Not on file  Tobacco Use   Smoking status: Former    Types: Cigarettes    Quit date: 02/26/2021    Years since quitting: 1.3   Smokeless tobacco: Never   Tobacco comments:    Quit  Substance and Sexual Activity   Alcohol use: Yes    Alcohol/week: 0.0 standard drinks of alcohol    Comment: holidays   Drug use: No   Sexual activity: Yes     Birth control/protection: None  Other Topics Concern   Not on file  Social History Narrative   Not on file   Social Determinants of Health   Financial Resource Strain: Not on file  Food Insecurity: Not on file  Transportation Needs: Not on file  Physical Activity: Not on file  Stress: Not on file  Social Connections: Not on file  Intimate Partner Violence: Not on file      Review of Systems  Constitutional:  Negative for chills, fatigue and unexpected weight change.  HENT:  Negative for congestion, rhinorrhea, sneezing and sore throat.   Eyes:  Positive for visual disturbance. Negative for redness.  Respiratory:  Negative for cough, chest tightness and shortness of breath.   Cardiovascular:  Negative for chest pain and palpitations.  Gastrointestinal:  Negative for abdominal pain, constipation, diarrhea, nausea and vomiting.  Genitourinary:  Negative for dysuria and frequency.  Musculoskeletal:  Positive for arthralgias. Negative for back pain, joint swelling and neck pain.  Skin:  Negative for rash.  Neurological: Negative.  Negative for tremors and numbness.  Hematological:  Negative for adenopathy. Does not bruise/bleed easily.  Psychiatric/Behavioral:  Negative for behavioral problems (Depression), sleep disturbance and suicidal ideas. The patient is not nervous/anxious.     Vital Signs: BP (!) 145/89   Pulse 77   Temp 98 F (36.7 C)   Resp 16   Ht 5\' 6"  (1.676 m)   Wt 147 lb (66.7 kg)   SpO2 99%   BMI 23.73 kg/m    Physical Exam Vitals and nursing note reviewed.  Constitutional:      General: She is not in acute distress.    Appearance: She is well-developed and normal weight. She is not diaphoretic.  HENT:     Head: Normocephalic and atraumatic.     Mouth/Throat:     Pharynx: No oropharyngeal exudate.  Neck:     Thyroid: No thyromegaly.     Vascular: No JVD.     Trachea: No tracheal deviation.  Cardiovascular:     Rate and Rhythm: Normal rate and  regular rhythm.     Heart sounds: Normal heart sounds. No murmur heard.    No friction rub. No gallop.  Pulmonary:     Effort: Pulmonary effort is normal. No respiratory distress.     Breath sounds: No wheezing or rales.  Chest:     Chest wall: No tenderness.  Abdominal:     General: Bowel sounds are normal.     Palpations: Abdomen is soft.  Musculoskeletal:        General: No swelling or tenderness. Normal range of motion.     Cervical back: Normal range of motion and neck supple.     Right lower leg: No edema.     Left lower leg: No  edema.  Lymphadenopathy:     Cervical: No cervical adenopathy.  Skin:    General: Skin is warm and dry.  Neurological:     Mental Status: She is alert and oriented to person, place, and time.     Cranial Nerves: No cranial nerve deficit.  Psychiatric:        Behavior: Behavior normal.        Thought Content: Thought content normal.        Judgment: Judgment normal.        Assessment/Plan: 1. Type 2 diabetes mellitus with hyperglycemia, without long-term current use of insulin (HCC) - POCT HgB A1C is 10.6 which is significantly increased from 8.4 last check. Will increase to 2 tabs glucovance BID and pt will contact her insurance about other medication options that are covered as cost/coverage has been a problem with many medications. Pt is against injection medications at this time. Discussed if not improving will need to start insulin and/or see endocrinology. Will work on improving diet and exercise. Will have 4 week follow up for closer glucose monitoring.  2. Essential hypertension Borderline elevated in office. Continue current medications and monitoring.  3. Optic neuritis Followed by neurology   General Counseling: Bonney Roussel understanding of the findings of todays visit and agrees with plan of treatment. I have discussed any further diagnostic evaluation that may be needed or ordered today. We also reviewed her medications  today. she has been encouraged to call the office with any questions or concerns that should arise related to todays visit.    Orders Placed This Encounter  Procedures   POCT HgB A1C    Meds ordered this encounter  Medications   DISCONTD: glyBURIDE-metformin (GLUCOVANCE) 5-500 MG tablet    Sig: Take 1 tablet by mouth 2 (two) times daily with a meal. TAKE 2 TABLETS BY MOUTH TWICE DAILY OR IF BLOOD SUGAR DROPS TAKE 1 TABLET TWICE DAILY    Dispense:  360 tablet    Refill:  1    PA approved from 02/12/22 to 02/12/23   glyBURIDE-metformin (GLUCOVANCE) 5-500 MG tablet    Sig: TAKE 2 TABLETS BY MOUTH TWICE DAILY    Dispense:  360 tablet    Refill:  1    PA approved from 02/12/22 to 02/12/23    This patient was seen by Lynn Ito, PA-C in collaboration with Dr. Beverely Risen as a part of collaborative care agreement.   Total time spent:30 Minutes Time spent includes review of chart, medications, test results, and follow up plan with the patient.      Dr Lyndon Code Internal medicine

## 2022-06-10 MED ORDER — GLYBURIDE-METFORMIN 5-500 MG PO TABS: ORAL_TABLET | ORAL | 1 refills | Status: DC

## 2022-06-10 NOTE — Telephone Encounter (Signed)
Resent prescription with corrected dosage to pharmacy

## 2022-07-04 ENCOUNTER — Encounter: Payer: Self-pay | Admitting: Physician Assistant

## 2022-07-04 ENCOUNTER — Ambulatory Visit: Payer: 59 | Admitting: Physician Assistant

## 2022-07-04 ENCOUNTER — Telehealth: Payer: Self-pay

## 2022-07-04 ENCOUNTER — Other Ambulatory Visit: Payer: Self-pay | Admitting: Physician Assistant

## 2022-07-04 VITALS — BP 135/85 | HR 74 | Temp 98.3°F | Resp 16 | Ht 66.0 in | Wt 146.8 lb

## 2022-07-04 DIAGNOSIS — I10 Essential (primary) hypertension: Secondary | ICD-10-CM | POA: Diagnosis not present

## 2022-07-04 DIAGNOSIS — E1165 Type 2 diabetes mellitus with hyperglycemia: Secondary | ICD-10-CM

## 2022-07-04 MED ORDER — LINAGLIPTIN 5 MG PO TABS: 5.0000 mg | ORAL_TABLET | Freq: Every day | ORAL | 2 refills | Status: DC

## 2022-07-04 NOTE — Telephone Encounter (Signed)
Phar send that  Tradjenta not covered as Regina Short advised pt that we change to Januvia 100 mg take 1/2 tab po daily for 1 week and then take 1 tab po daily

## 2022-07-04 NOTE — Progress Notes (Signed)
[facility] [address] [place], [place] [address]  Internal MEDICINE  Office Visit Note  Patient Name: Regina Short  [id number]  [id number]  Date of Service: 07/04/2022  Chief Complaint  Patient presents with   Follow-up   Diabetes   Gastroesophageal Reflux   Hypertension   Quality Metric Gaps    Foot Exam    HPI Pt is here for routine follow up for diabetes -She did contact her insurance to find out which diabetes medications preferred and was told to try Tradjenta. Her medication options have been very limited due to cost and she does not want to be on any injections/insulin. -Since increasing glucovance her sugars have been coming down to 150-220, no more in 300s -Did discuss if not improving significantly on next check will need to refer to endocrinology -Still working with neurology to figure out next steps, no infusions set up currently due to cost  Current Medication: Outpatient Encounter Medications as of 07/04/2022  Medication Sig   Accu-Chek FastClix Lancets MISC Use as directed to check blood sugars twice a day  E11.65   benazepril (LOTENSIN) 10 MG tablet Take 1 tablet (10 mg total) by mouth daily.   Cholecalciferol 125 MCG (5000 UT) capsule Take by mouth.   gabapentin (NEURONTIN) 300 MG capsule Take 300 mg by mouth 3 (three) times daily.   glucose blood (ACCU-CHEK GUIDE) test strip USE TO TEST BLOOD SUGARS TWICE A DAY AS NEEDED E11.65   glyBURIDE-metformin (GLUCOVANCE) 5-500 MG tablet TAKE 2 TABLETS BY MOUTH TWICE DAILY   linagliptin (TRADJENTA) 5 MG TABS tablet Take 1 tablet (5 mg total) by mouth daily.   methocarbamol (ROBAXIN) 500 MG tablet Take 1 tablet (500 mg total) by mouth 2 (two) times daily as needed for muscle spasms.   pravastatin (PRAVACHOL) 10 MG tablet Take 1 tablet (10 mg total) by mouth daily.   No facility-administered encounter medications on file as of 07/04/2022.    Surgical History: Past Surgical History:  Procedure  Laterality Date   ABDOMINAL HYSTERECTOMY     ABDOMINAL SURGERY     5 tumors removed   BREAST BIOPSY Right 02/02/15 core   focal fibroadenomatoid changes and sclerosis    Medical History: Past Medical History:  Diagnosis Date   Chronic back pain    Diabetes mellitus without complication (HCC)    GERD (gastroesophageal reflux disease)    Hand tendonitis    Hypertension     Family History: Family History  Problem Relation Age of Onset   Congestive Heart Failure Other    Breast cancer Maternal Aunt 22    Social History   Socioeconomic History   Marital status: Single    Spouse name: Not on file   Number of children: Not on file   Years of education: Not on file   Highest education level: Not on file  Occupational History   Not on file  Tobacco Use   Smoking status: Former    Types: Cigarettes    Quit date: 02/26/2021    Years since quitting: 1.3   Smokeless tobacco: Never   Tobacco comments:    Quit  Substance and Sexual Activity   Alcohol use: Yes    Alcohol/week: 0.0 standard drinks of alcohol    Comment: holidays   Drug use: No   Sexual activity: Yes    Birth control/protection: None  Other Topics Concern   Not on file  Social History Narrative   Not on file   Social  Determinants of Health   Financial Resource Strain: Not on file  Food Insecurity: Not on file  Transportation Needs: Not on file  Physical Activity: Not on file  Stress: Not on file  Social Connections: Not on file  Intimate Partner Violence: Not on file      Review of Systems  Constitutional:  Negative for chills, fatigue and unexpected weight change.  HENT:  Negative for congestion, rhinorrhea, sneezing and sore throat.   Eyes:  Positive for visual disturbance. Negative for redness.  Respiratory:  Negative for cough, chest tightness and shortness of breath.   Cardiovascular:  Negative for chest pain and palpitations.  Gastrointestinal:  Negative for abdominal pain, constipation,  diarrhea, nausea and vomiting.  Genitourinary:  Negative for dysuria and frequency.  Musculoskeletal:  Positive for arthralgias. Negative for back pain, joint swelling and neck pain.  Skin:  Negative for rash.  Neurological: Negative.  Negative for tremors and numbness.  Hematological:  Negative for adenopathy. Does not bruise/bleed easily.  Psychiatric/Behavioral:  Negative for behavioral problems (Depression), sleep disturbance and suicidal ideas. The patient is not nervous/anxious.     Vital Signs: BP 135/85   Pulse 74   Temp 98.3 F (36.8 C)   Resp 16   Ht 5\' 6"  (1.676 m)   Wt 146 lb 12.8 oz (66.6 kg)   SpO2 99%   BMI 23.69 kg/m    Physical Exam Vitals and nursing note reviewed.  Constitutional:      General: She is not in acute distress.    Appearance: She is well-developed and normal weight. She is not diaphoretic.  HENT:     Head: Normocephalic and atraumatic.     Mouth/Throat:     Pharynx: No oropharyngeal exudate.  Neck:     Thyroid: No thyromegaly.     Vascular: No JVD.     Trachea: No tracheal deviation.  Cardiovascular:     Rate and Rhythm: Normal rate and regular rhythm.     Heart sounds: Normal heart sounds. No murmur heard.    No friction rub. No gallop.  Pulmonary:     Effort: Pulmonary effort is normal. No respiratory distress.     Breath sounds: No wheezing or rales.  Chest:     Chest wall: No tenderness.  Abdominal:     General: Bowel sounds are normal.     Palpations: Abdomen is soft.  Musculoskeletal:        General: No swelling or tenderness. Normal range of motion.     Cervical back: Normal range of motion and neck supple.     Right lower leg: No edema.     Left lower leg: No edema.  Lymphadenopathy:     Cervical: No cervical adenopathy.  Skin:    General: Skin is warm and dry.  Neurological:     Mental Status: She is alert and oriented to person, place, and time.     Cranial Nerves: No cranial nerve deficit.  Psychiatric:         Behavior: Behavior normal.        Thought Content: Thought content normal.        Judgment: Judgment normal.        Assessment/Plan: 1. Type 2 diabetes mellitus with hyperglycemia, without long-term current use of insulin (HCC) Will continue glucovance and add tradjenta. Will continue to monitor sugars and work on diet and exercise. If not significantly improving will need to consider endocrinology referral - linagliptin (TRADJENTA) 5 MG TABS tablet; Take 1  tablet (5 mg total) by mouth daily.  Dispense: 30 tablet; Refill: 2  2. Essential hypertension Stable, continue current medications   General Counseling: Regina Short understanding of the findings of todays visit and agrees with plan of treatment. I have discussed any further diagnostic evaluation that may be needed or ordered today. We also reviewed her medications today. she has been encouraged to call the office with any questions or concerns that should arise related to todays visit.    No orders of the defined types were placed in this encounter.   Meds ordered this encounter  Medications   linagliptin (TRADJENTA) 5 MG TABS tablet    Sig: Take 1 tablet (5 mg total) by mouth daily.    Dispense:  30 tablet    Refill:  2    This patient was seen by Lynn Ito, PA-C in collaboration with Dr. Beverely Risen as a part of collaborative care agreement.   Total time spent:30 Minutes Time spent includes review of chart, medications, test results, and follow up plan with the patient.      Dr Lyndon Code Internal medicine

## 2022-07-04 NOTE — Telephone Encounter (Signed)
Regina Short is not covered

## 2022-07-09 ENCOUNTER — Telehealth: Payer: Self-pay

## 2022-07-09 NOTE — Telephone Encounter (Signed)
Spoke with pt that she can pickup januvia samples and she can take 1/2 tab po for one week and take 1 tab po daily and we can do PA and pt assistance and send message to desha

## 2022-07-12 ENCOUNTER — Telehealth: Payer: Self-pay

## 2022-07-12 NOTE — Telephone Encounter (Signed)
PA for JANUVIA 100 mg tablets 07/12/22 @ 725 pm

## 2022-07-15 ENCOUNTER — Telehealth: Payer: Self-pay

## 2022-07-15 DIAGNOSIS — E1165 Type 2 diabetes mellitus with hyperglycemia: Secondary | ICD-10-CM

## 2022-07-15 MED ORDER — SITAGLIPTIN PHOSPHATE 100 MG PO TABS: 100.0000 mg | ORAL_TABLET | Freq: Every day | ORAL | 1 refills | Status: DC

## 2022-07-15 NOTE — Telephone Encounter (Signed)
PA for JANUVIA 100 mg tablets was approved 07/12/22 to 07/13/23 sent new rx to pharmacy with approval information

## 2022-07-18 ENCOUNTER — Telehealth: Payer: Self-pay

## 2022-07-18 NOTE — Telephone Encounter (Signed)
Called pharmacy and asked about the copay on the JANUVIA 100 mg and it was $20.00 for 30 days.  I spoke to pt to see if we still needed to send the Pt asst application and pt advised that we can hold off at the moment.

## 2022-08-04 ENCOUNTER — Other Ambulatory Visit: Payer: Self-pay | Admitting: Physician Assistant

## 2022-08-22 ENCOUNTER — Other Ambulatory Visit: Payer: Self-pay | Admitting: Physician Assistant

## 2022-09-05 ENCOUNTER — Encounter: Payer: Self-pay | Admitting: Physician Assistant

## 2022-09-05 ENCOUNTER — Ambulatory Visit (INDEPENDENT_AMBULATORY_CARE_PROVIDER_SITE_OTHER): Payer: 59 | Admitting: Physician Assistant

## 2022-09-05 DIAGNOSIS — E559 Vitamin D deficiency, unspecified: Secondary | ICD-10-CM | POA: Diagnosis not present

## 2022-09-05 DIAGNOSIS — E1165 Type 2 diabetes mellitus with hyperglycemia: Secondary | ICD-10-CM | POA: Diagnosis not present

## 2022-09-05 DIAGNOSIS — E782 Mixed hyperlipidemia: Secondary | ICD-10-CM

## 2022-09-05 DIAGNOSIS — E538 Deficiency of other specified B group vitamins: Secondary | ICD-10-CM

## 2022-09-05 DIAGNOSIS — R5383 Other fatigue: Secondary | ICD-10-CM

## 2022-09-05 DIAGNOSIS — Z23 Encounter for immunization: Secondary | ICD-10-CM | POA: Diagnosis not present

## 2022-09-05 DIAGNOSIS — R946 Abnormal results of thyroid function studies: Secondary | ICD-10-CM

## 2022-09-05 DIAGNOSIS — I10 Essential (primary) hypertension: Secondary | ICD-10-CM | POA: Diagnosis not present

## 2022-09-05 LAB — POCT GLYCOSYLATED HEMOGLOBIN (HGB A1C): Hemoglobin A1C: 7.1 % — AB (ref 4.0–5.6)

## 2022-09-05 NOTE — Progress Notes (Signed)
[facility] [address] [place], [place] [address]  Internal MEDICINE  Office Visit Note  Patient Name: Regina Short  [id number]  [id number]  Date of Service: 09/05/2022  Chief Complaint  Patient presents with   Follow-up   Diabetes   Gastroesophageal Reflux   Hypertension   Quality Metric Gaps    Foot exam    HPI Pt is here for routine follow up -She reports she was unable to move forward with planned neurology treatment at [facility] due to cost. She states she still has some blurred vision on right, but left continues to be ok. She will follow up as able -Has been taking januvia in Am, and taking 1.5 tabs glucovance BID. May drop extra 1/2 tab of this if sugars start dropping too low. -Some fluctuations in readings, 90-150 typically, only na occasional reading above 200 now which is a big improvement -She has been tolerating the [place] and really thinks it is helping -Does have some right shoulder pain and stiffness when she wakes that then improves once she is up and moving. Likely arthritis after laying on shoulder at night. Tolerating ok right now -BP stable -She has CPE next visit and will go ahead and order routine fasting labs  Current Medication: Outpatient Encounter Medications as of 09/05/2022  Medication Sig   Accu-Chek FastClix Lancets MISC Use as directed to check blood sugars twice a day  E11.65   benazepril (LOTENSIN) 10 MG tablet Take 1 tablet (10 mg total) by mouth daily.   Cholecalciferol 125 MCG (5000 UT) capsule Take by mouth.   gabapentin (NEURONTIN) 300 MG capsule Take 300 mg by mouth 3 (three) times daily.   glucose blood (ACCU-CHEK GUIDE) test strip USE TO TEST BLOOD SUGARS TWICE A DAY AS NEEDED E11.65   glyBURIDE-metformin (GLUCOVANCE) 5-500 MG tablet TAKE 1.5 TABLET BY MOUTH TWICE A DAY OR IF BLOOD SUGAR DROPS TAKE 1 TABLET TWICE A DAY   methocarbamol (ROBAXIN) 500 MG tablet Take 1 tablet (500 mg total) by mouth 2 (two) times daily as needed  for muscle spasms.   pravastatin (PRAVACHOL) 10 MG tablet TAKE 1 TABLET BY MOUTH EVERY DAY   sitaGLIPtin (JANUVIA) 100 MG tablet Take 1 tablet (100 mg total) by mouth daily.   No facility-administered encounter medications on file as of 09/05/2022.    Surgical History: Past Surgical History:  Procedure Laterality Date   ABDOMINAL HYSTERECTOMY     ABDOMINAL SURGERY     5 tumors removed   BREAST BIOPSY Right 02/02/15 core   focal fibroadenomatoid changes and sclerosis    Medical History: Past Medical History:  Diagnosis Date   Chronic back pain    Diabetes mellitus without complication (HCC)    GERD (gastroesophageal reflux disease)    Hand tendonitis    Hypertension     Family History: Family History  Problem Relation Age of Onset   Congestive Heart Failure Other    Breast cancer Maternal Aunt 55    Social History   Socioeconomic History   Marital status: Single    Spouse name: Not on file   Number of children: Not on file   Years of education: Not on file   Highest education level: Not on file  Occupational History   Not on file  Tobacco Use   Smoking status: Former    Types: Cigarettes    Quit date: 02/26/2021    Years since quitting: 1.5   Smokeless tobacco: Never   Tobacco comments:  Quit  Substance and Sexual Activity   Alcohol use: Yes    Alcohol/week: 0.0 standard drinks of alcohol    Comment: holidays   Drug use: No   Sexual activity: Yes    Birth control/protection: None  Other Topics Concern   Not on file  Social History Narrative   Not on file   Social Determinants of Health   Financial Resource Strain: Not on file  Food Insecurity: Not on file  Transportation Needs: Not on file  Physical Activity: Not on file  Stress: Not on file  Social Connections: Not on file  Intimate Partner Violence: Not on file      Review of Systems  Constitutional:  Negative for chills, fatigue and unexpected weight change.  HENT:  Negative for  congestion, rhinorrhea, sneezing and sore throat.   Eyes:  Positive for visual disturbance. Negative for redness.  Respiratory:  Negative for cough, chest tightness and shortness of breath.   Cardiovascular:  Negative for chest pain and palpitations.  Gastrointestinal:  Negative for abdominal pain, constipation, diarrhea, nausea and vomiting.  Genitourinary:  Negative for dysuria and frequency.  Musculoskeletal:  Positive for arthralgias. Negative for back pain, joint swelling and neck pain.  Skin:  Negative for rash.  Neurological: Negative.  Negative for tremors and numbness.  Hematological:  Negative for adenopathy. Does not bruise/bleed easily.  Psychiatric/Behavioral:  Negative for behavioral problems (Depression), sleep disturbance and suicidal ideas. The patient is not nervous/anxious.     Vital Signs: BP 139/89   Pulse 77   Temp (!) 96.7 F (35.9 C)   Resp 16   Ht 5\' 6"  (1.676 m)   Wt 148 lb (67.1 kg)   SpO2 98%   BMI 23.89 kg/m    Physical Exam Vitals and nursing note reviewed.  Constitutional:      General: She is not in acute distress.    Appearance: She is well-developed and normal weight. She is not diaphoretic.  HENT:     Head: Normocephalic and atraumatic.     Mouth/Throat:     Pharynx: No oropharyngeal exudate.  Neck:     Thyroid: No thyromegaly.     Vascular: No JVD.     Trachea: No tracheal deviation.  Cardiovascular:     Rate and Rhythm: Normal rate and regular rhythm.     Heart sounds: Normal heart sounds. No murmur heard.    No friction rub. No gallop.  Pulmonary:     Effort: Pulmonary effort is normal. No respiratory distress.     Breath sounds: No wheezing or rales.  Chest:     Chest wall: No tenderness.  Abdominal:     General: Bowel sounds are normal.     Palpations: Abdomen is soft.  Musculoskeletal:        General: No swelling or tenderness. Normal range of motion.     Cervical back: Normal range of motion and neck supple.     Right  lower leg: No edema.     Left lower leg: No edema.  Lymphadenopathy:     Cervical: No cervical adenopathy.  Skin:    General: Skin is warm and dry.  Neurological:     Mental Status: She is alert and oriented to person, place, and time.     Cranial Nerves: No cranial nerve deficit.  Psychiatric:        Behavior: Behavior normal.        Thought Content: Thought content normal.  Judgment: Judgment normal.        Assessment/Plan: 1. Type 2 diabetes mellitus with hyperglycemia, without long-term current use of insulin (HCC) - POCT HgB A1C is 7.1 which is significantly improved from 10.6 at last check.  Patient was applauded on this success and encouraged to continue working on her diet and exercise and taking her medications as prescribed.  Did discuss if sugars continue to drop may be able to back down on Glucovance to 1 tablet twice daily instead of 1-1/2 tablets twice daily.  We will continue on the 100 mg Januvia in a.m.  2. Essential hypertension Stable, continue current medications  3. Vitamin D deficiency - VITAMIN D 25 Hydroxy (Vit-D Deficiency, Fractures)  4. B12 deficiency - B12 and Folate Panel  5. Abnormal thyroid exam - TSH + free T4  6. Mixed hyperlipidemia Continue pravastatin and we will update labs - Lipid Panel With LDL/HDL Ratio  7. Flu vaccine need - Flu Vaccine MDCK QUAD PF  8. Other fatigue - TSH + free T4 - CBC w/Diff/Platelet - Comprehensive metabolic panel - Lipid Panel With LDL/HDL Ratio - B12 and Folate Panel - VITAMIN D 25 Hydroxy (Vit-D Deficiency, Fractures)   General Counseling: Laurenashley verbalizes understanding of the findings of todays visit and agrees with plan of treatment. I have discussed any further diagnostic evaluation that may be needed or ordered today. We also reviewed her medications today. she has been encouraged to call the office with any questions or concerns that should arise related to todays visit.    Orders  Placed This Encounter  Procedures   Flu Vaccine MDCK QUAD PF   TSH + free T4   CBC w/Diff/Platelet   Comprehensive metabolic panel   Lipid Panel With LDL/HDL Ratio   B12 and Folate Panel   VITAMIN D 25 Hydroxy (Vit-D Deficiency, Fractures)   POCT HgB A1C    No orders of the defined types were placed in this encounter.   This patient was seen by Lynn Ito, PA-C in collaboration with Dr. Beverely Risen as a part of collaborative care agreement.   Total time spent:30 Minutes Time spent includes review of chart, medications, test results, and follow up plan with the patient.      Dr Lyndon Code Internal medicine

## 2022-10-24 DIAGNOSIS — R5383 Other fatigue: Secondary | ICD-10-CM | POA: Diagnosis not present

## 2022-10-24 DIAGNOSIS — E538 Deficiency of other specified B group vitamins: Secondary | ICD-10-CM | POA: Diagnosis not present

## 2022-10-24 DIAGNOSIS — R946 Abnormal results of thyroid function studies: Secondary | ICD-10-CM | POA: Diagnosis not present

## 2022-10-24 DIAGNOSIS — E782 Mixed hyperlipidemia: Secondary | ICD-10-CM | POA: Diagnosis not present

## 2022-10-24 DIAGNOSIS — E559 Vitamin D deficiency, unspecified: Secondary | ICD-10-CM | POA: Diagnosis not present

## 2022-10-25 LAB — CBC WITH DIFFERENTIAL/PLATELET
Basophils Absolute: 0 10*3/uL (ref 0.0–0.2)
Basos: 1 %
EOS (ABSOLUTE): 0.1 10*3/uL (ref 0.0–0.4)
Eos: 2 %
Hematocrit: 34.5 % (ref 34.0–46.6)
Hemoglobin: 11.4 g/dL (ref 11.1–15.9)
Immature Grans (Abs): 0 10*3/uL (ref 0.0–0.1)
Immature Granulocytes: 0 %
Lymphocytes Absolute: 1.7 10*3/uL (ref 0.7–3.1)
Lymphs: 45 %
MCH: 26.6 pg (ref 26.6–33.0)
MCHC: 33 g/dL (ref 31.5–35.7)
MCV: 80 fL (ref 79–97)
Monocytes Absolute: 0.3 10*3/uL (ref 0.1–0.9)
Monocytes: 8 %
Neutrophils Absolute: 1.6 10*3/uL (ref 1.4–7.0)
Neutrophils: 44 %
Platelets: 247 10*3/uL (ref 150–450)
RBC: 4.29 x10E6/uL (ref 3.77–5.28)
RDW: 15.8 % — ABNORMAL HIGH (ref 11.7–15.4)
WBC: 3.6 10*3/uL (ref 3.4–10.8)

## 2022-10-25 LAB — COMPREHENSIVE METABOLIC PANEL
ALT: 6 IU/L (ref 0–32)
AST: 11 IU/L (ref 0–40)
Albumin/Globulin Ratio: 2.4 — ABNORMAL HIGH (ref 1.2–2.2)
Albumin: 4.5 g/dL (ref 3.9–4.9)
Alkaline Phosphatase: 84 IU/L (ref 44–121)
BUN/Creatinine Ratio: 20 (ref 12–28)
BUN: 13 mg/dL (ref 8–27)
Bilirubin Total: 0.3 mg/dL (ref 0.0–1.2)
CO2: 24 mmol/L (ref 20–29)
Calcium: 9.4 mg/dL (ref 8.7–10.3)
Chloride: 104 mmol/L (ref 96–106)
Creatinine, Ser: 0.65 mg/dL (ref 0.57–1.00)
Globulin, Total: 1.9 g/dL (ref 1.5–4.5)
Glucose: 141 mg/dL — ABNORMAL HIGH (ref 70–99)
Potassium: 4.6 mmol/L (ref 3.5–5.2)
Sodium: 140 mmol/L (ref 134–144)
Total Protein: 6.4 g/dL (ref 6.0–8.5)
eGFR: 100 mL/min/{1.73_m2} (ref >59)

## 2022-10-25 LAB — LIPID PANEL WITH LDL/HDL RATIO
Cholesterol, Total: 175 mg/dL (ref 100–199)
HDL: 89 mg/dL (ref >39)
LDL Chol Calc (NIH): 73 mg/dL (ref 0–99)
LDL/HDL Ratio: 0.8 ratio (ref 0.0–3.2)
Triglycerides: 69 mg/dL (ref 0–149)
VLDL Cholesterol Cal: 13 mg/dL (ref 5–40)

## 2022-10-25 LAB — B12 AND FOLATE PANEL
Folate: 15.5 ng/mL (ref >3.0)
Vitamin B-12: 750 pg/mL (ref 232–1245)

## 2022-10-25 LAB — VITAMIN D 25 HYDROXY (VIT D DEFICIENCY, FRACTURES): Vit D, 25-Hydroxy: 47.9 ng/mL (ref 30.0–100.0)

## 2022-10-25 LAB — TSH+FREE T4
Free T4: 1.37 ng/dL (ref 0.82–1.77)
TSH: 0.969 u[IU]/mL (ref 0.450–4.500)

## 2022-10-29 ENCOUNTER — Telehealth: Payer: Self-pay

## 2022-10-29 NOTE — Telephone Encounter (Signed)
Spoke with patient regarding normal lab results. 

## 2022-10-29 NOTE — Telephone Encounter (Signed)
-----   Message from Carlean Jews, PA-C sent at 10/28/2022  4:34 PM EST ----- Please let her know that overall her labs look good and her cholesterol was greatly improved.

## 2022-12-12 ENCOUNTER — Other Ambulatory Visit: Payer: Self-pay

## 2022-12-12 ENCOUNTER — Encounter: Payer: 59 | Admitting: Physician Assistant

## 2022-12-17 ENCOUNTER — Encounter: Payer: Self-pay | Admitting: Emergency Medicine

## 2022-12-17 ENCOUNTER — Emergency Department
Admission: EM | Admit: 2022-12-17 | Discharge: 2022-12-17 | Disposition: A | Discharge status: Home / Self Care | Payer: 59 | Attending: Emergency Medicine | Admitting: Emergency Medicine

## 2022-12-17 ENCOUNTER — Emergency Department: Payer: 59

## 2022-12-17 DIAGNOSIS — M546 Pain in thoracic spine: Secondary | ICD-10-CM | POA: Diagnosis not present

## 2022-12-17 DIAGNOSIS — M545 Low back pain, unspecified: Secondary | ICD-10-CM | POA: Diagnosis not present

## 2022-12-17 DIAGNOSIS — W19XXXA Unspecified fall, initial encounter: Secondary | ICD-10-CM | POA: Diagnosis not present

## 2022-12-17 DIAGNOSIS — S3992XA Unspecified injury of lower back, initial encounter: Secondary | ICD-10-CM | POA: Insufficient documentation

## 2022-12-17 DIAGNOSIS — Y9389 Activity, other specified: Secondary | ICD-10-CM | POA: Diagnosis not present

## 2022-12-17 DIAGNOSIS — M5459 Other low back pain: Secondary | ICD-10-CM | POA: Diagnosis not present

## 2022-12-17 DIAGNOSIS — M47814 Spondylosis without myelopathy or radiculopathy, thoracic region: Secondary | ICD-10-CM | POA: Diagnosis not present

## 2022-12-17 DIAGNOSIS — Y92410 Unspecified street and highway as the place of occurrence of the external cause: Secondary | ICD-10-CM | POA: Diagnosis not present

## 2022-12-17 DIAGNOSIS — M4316 Spondylolisthesis, lumbar region: Secondary | ICD-10-CM | POA: Diagnosis not present

## 2022-12-17 DIAGNOSIS — Y998 Other external cause status: Secondary | ICD-10-CM | POA: Diagnosis not present

## 2022-12-17 MED ORDER — LIDOCAINE 5 % EX PTCH: 1.0000 | MEDICATED_PATCH | Freq: Two times a day (BID) | CUTANEOUS | 0 refills | Status: AC
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
## 2022-12-17 NOTE — Discharge Instructions (Signed)
Please use tylenol along side the lidocaine patches to help manage your pain.

## 2022-12-18 ENCOUNTER — Ambulatory Visit (INDEPENDENT_AMBULATORY_CARE_PROVIDER_SITE_OTHER): Payer: 59 | Admitting: Physician Assistant

## 2022-12-18 ENCOUNTER — Encounter: Payer: Self-pay | Admitting: Physician Assistant

## 2022-12-18 VITALS — BP 134/80 | HR 78 | Temp 97.9°F | Resp 16 | Ht 66.0 in | Wt 147.8 lb

## 2022-12-18 DIAGNOSIS — I10 Essential (primary) hypertension: Secondary | ICD-10-CM | POA: Diagnosis not present

## 2022-12-18 DIAGNOSIS — Z0001 Encounter for general adult medical examination with abnormal findings: Secondary | ICD-10-CM

## 2022-12-18 DIAGNOSIS — E1165 Type 2 diabetes mellitus with hyperglycemia: Secondary | ICD-10-CM

## 2022-12-18 DIAGNOSIS — Z1231 Encounter for screening mammogram for malignant neoplasm of breast: Secondary | ICD-10-CM

## 2022-12-18 DIAGNOSIS — R300 Dysuria: Secondary | ICD-10-CM | POA: Diagnosis not present

## 2022-12-18 LAB — POCT GLYCOSYLATED HEMOGLOBIN (HGB A1C): Hemoglobin A1C: 7.2 % — AB (ref 4.0–5.6)
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
## 2022-12-19 LAB — MICROSCOPIC EXAMINATION
Bacteria, UA: NONE SEEN
Casts: NONE SEEN /lpf

## 2022-12-19 LAB — UA/M W/RFLX CULTURE, ROUTINE
Bilirubin, UA: NEGATIVE
Leukocytes,UA: NEGATIVE
Nitrite, UA: NEGATIVE
Protein,UA: NEGATIVE
RBC, UA: NEGATIVE
Specific Gravity, UA: 1.028 (ref 1.005–1.030)
Urobilinogen, Ur: 1 mg/dL (ref 0.2–1.0)
pH, UA: 5 (ref 5.0–7.5)

## 2022-12-19 LAB — MICROALBUMIN / CREATININE URINE RATIO
Creatinine, Urine: 119.9 mg/dL
Microalb/Creat Ratio: 13 mg/g creat (ref 0–29)
Microalbumin, Urine: 15.6 ug/mL

## 2022-12-22 ENCOUNTER — Other Ambulatory Visit: Payer: Self-pay | Admitting: Physician Assistant

## 2022-12-22 DIAGNOSIS — E1165 Type 2 diabetes mellitus with hyperglycemia: Secondary | ICD-10-CM

## 2023-01-16 ENCOUNTER — Emergency Department (HOSPITAL_COMMUNITY): Payer: 59

## 2023-01-16 ENCOUNTER — Encounter (HOSPITAL_COMMUNITY): Payer: Self-pay

## 2023-01-16 ENCOUNTER — Other Ambulatory Visit: Payer: Self-pay

## 2023-01-16 ENCOUNTER — Observation Stay (HOSPITAL_COMMUNITY)
Admission: EM | Admit: 2023-01-16 | Discharge: 2023-01-19 | Disposition: A | Discharge status: Home / Self Care | Payer: 59 | Attending: General Surgery | Admitting: General Surgery

## 2023-01-16 DIAGNOSIS — M40204 Unspecified kyphosis, thoracic region: Secondary | ICD-10-CM | POA: Diagnosis not present

## 2023-01-16 DIAGNOSIS — G936 Cerebral edema: Secondary | ICD-10-CM | POA: Diagnosis not present

## 2023-01-16 DIAGNOSIS — Z79899 Other long term (current) drug therapy: Secondary | ICD-10-CM | POA: Diagnosis not present

## 2023-01-16 DIAGNOSIS — S0633AA Contusion and laceration of cerebrum, unspecified, with loss of consciousness status unknown, initial encounter: Secondary | ICD-10-CM

## 2023-01-16 DIAGNOSIS — S0003XA Contusion of scalp, initial encounter: Secondary | ICD-10-CM | POA: Diagnosis not present

## 2023-01-16 DIAGNOSIS — S22008A Other fracture of unspecified thoracic vertebra, initial encounter for closed fracture: Secondary | ICD-10-CM | POA: Insufficient documentation

## 2023-01-16 DIAGNOSIS — R109 Unspecified abdominal pain: Secondary | ICD-10-CM | POA: Diagnosis not present

## 2023-01-16 DIAGNOSIS — S2242XA Multiple fractures of ribs, left side, initial encounter for closed fracture: Secondary | ICD-10-CM | POA: Diagnosis not present

## 2023-01-16 DIAGNOSIS — E119 Type 2 diabetes mellitus without complications: Secondary | ICD-10-CM | POA: Insufficient documentation

## 2023-01-16 DIAGNOSIS — S2241XA Multiple fractures of ribs, right side, initial encounter for closed fracture: Secondary | ICD-10-CM | POA: Diagnosis not present

## 2023-01-16 DIAGNOSIS — I10 Essential (primary) hypertension: Secondary | ICD-10-CM | POA: Insufficient documentation

## 2023-01-16 DIAGNOSIS — Z87891 Personal history of nicotine dependence: Secondary | ICD-10-CM | POA: Insufficient documentation

## 2023-01-16 DIAGNOSIS — S22009A Unspecified fracture of unspecified thoracic vertebra, initial encounter for closed fracture: Secondary | ICD-10-CM

## 2023-01-16 DIAGNOSIS — M25512 Pain in left shoulder: Secondary | ICD-10-CM | POA: Diagnosis not present

## 2023-01-16 DIAGNOSIS — S2249XA Multiple fractures of ribs, unspecified side, initial encounter for closed fracture: Secondary | ICD-10-CM | POA: Diagnosis present

## 2023-01-16 DIAGNOSIS — S199XXA Unspecified injury of neck, initial encounter: Secondary | ICD-10-CM | POA: Diagnosis not present

## 2023-01-16 DIAGNOSIS — Z041 Encounter for examination and observation following transport accident: Secondary | ICD-10-CM | POA: Diagnosis not present

## 2023-01-16 LAB — I-STAT VENOUS BLOOD GAS, ED
Acid-Base Excess: 1 mmol/L (ref 0.0–2.0)
Bicarbonate: 28 mmol/L (ref 20.0–28.0)
Calcium, Ion: 1.21 mmol/L (ref 1.15–1.40)
HCT: 37 % (ref 36.0–46.0)
Hemoglobin: 12.6 g/dL (ref 12.0–15.0)
O2 Saturation: 50 %
Potassium: 4.1 mmol/L (ref 3.5–5.1)
Sodium: 137 mmol/L (ref 135–145)
TCO2: 30 mmol/L (ref 22–32)
pCO2, Ven: 51.7 mmHg (ref 44–60)
pH, Ven: 7.342 (ref 7.25–7.43)
pO2, Ven: 29 mmHg — CL (ref 32–45)

## 2023-01-16 LAB — CBC WITH DIFFERENTIAL/PLATELET
Abs Immature Granulocytes: 0.06 10*3/uL (ref 0.00–0.07)
Basophils Absolute: 0 10*3/uL (ref 0.0–0.1)
Basophils Relative: 0 %
Eosinophils Absolute: 0 10*3/uL (ref 0.0–0.5)
Eosinophils Relative: 0 %
HCT: 35.6 % — ABNORMAL LOW (ref 36.0–46.0)
Hemoglobin: 11.7 g/dL — ABNORMAL LOW (ref 12.0–15.0)
Immature Granulocytes: 1 %
Lymphocytes Relative: 16 %
Lymphs Abs: 1.5 10*3/uL (ref 0.7–4.0)
MCH: 26.5 pg (ref 26.0–34.0)
MCHC: 32.9 g/dL (ref 30.0–36.0)
MCV: 80.5 fL (ref 80.0–100.0)
Monocytes Absolute: 0.4 10*3/uL (ref 0.1–1.0)
Monocytes Relative: 5 %
Neutro Abs: 7.6 10*3/uL (ref 1.7–7.7)
Neutrophils Relative %: 78 %
Platelets: 235 10*3/uL (ref 150–400)
RBC: 4.42 MIL/uL (ref 3.87–5.11)
RDW: 16.1 % — ABNORMAL HIGH (ref 11.5–15.5)
WBC: 9.7 10*3/uL (ref 4.0–10.5)
nRBC: 0 % (ref 0.0–0.2)

## 2023-01-16 LAB — COMPREHENSIVE METABOLIC PANEL
ALT: 23 U/L (ref 0–44)
AST: 41 U/L (ref 15–41)
Albumin: 3.9 g/dL (ref 3.5–5.0)
Alkaline Phosphatase: 71 U/L (ref 38–126)
Anion gap: 9 (ref 5–15)
BUN: 20 mg/dL (ref 8–23)
CO2: 25 mmol/L (ref 22–32)
Calcium: 8.9 mg/dL (ref 8.9–10.3)
Chloride: 102 mmol/L (ref 98–111)
Creatinine, Ser: 1.07 mg/dL — ABNORMAL HIGH (ref 0.44–1.00)
GFR, Estimated: 59 mL/min — ABNORMAL LOW (ref >60)
Glucose, Bld: 244 mg/dL — ABNORMAL HIGH (ref 70–99)
Potassium: 4.1 mmol/L (ref 3.5–5.1)
Sodium: 136 mmol/L (ref 135–145)
Total Bilirubin: 0.3 mg/dL (ref 0.3–1.2)
Total Protein: 6.3 g/dL — ABNORMAL LOW (ref 6.5–8.1)

## 2023-01-16 MED ORDER — IOHEXOL 350 MG/ML SOLN
75.0000 mL | Freq: Once | INTRAVENOUS | Status: AC | PRN
  Administered 2023-01-16: 75 mL via INTRAVENOUS

## 2023-01-16 MED ORDER — INSULIN ASPART 100 UNIT/ML IJ SOLN
0.0000 [IU] | Freq: Three times a day (TID) | INTRAMUSCULAR | Status: DC
  Administered 2023-01-17 – 2023-01-18 (×3): 2 [IU] via SUBCUTANEOUS
  Administered 2023-01-18 – 2023-01-19 (×2): 7 [IU] via SUBCUTANEOUS
  Administered 2023-01-19: 2 [IU] via SUBCUTANEOUS

## 2023-01-16 MED ORDER — OXYCODONE HCL 5 MG PO TABS
5.0000 mg | ORAL_TABLET | ORAL | Status: DC | PRN
  Administered 2023-01-17 (×2): 5 mg via ORAL
  Administered 2023-01-17 – 2023-01-19 (×8): 10 mg via ORAL
  Filled 2023-01-16 (×5): qty 2
  Filled 2023-01-16 (×2): qty 1
  Filled 2023-01-16 (×3): qty 2

## 2023-01-16 MED ORDER — INSULIN ASPART 100 UNIT/ML IJ SOLN
0.0000 [IU] | Freq: Every day | INTRAMUSCULAR | Status: DC
  Administered 2023-01-17: 3 [IU] via SUBCUTANEOUS
  Administered 2023-01-17: 2 [IU] via SUBCUTANEOUS
  Administered 2023-01-18: 5 [IU] via SUBCUTANEOUS

## 2023-01-16 MED ORDER — HYDROMORPHONE HCL 1 MG/ML IJ SOLN
0.5000 mg | INTRAMUSCULAR | Status: DC | PRN
  Administered 2023-01-17 (×2): 0.5 mg via INTRAVENOUS
  Filled 2023-01-16: qty 0.5
  Filled 2023-01-16: qty 1

## 2023-01-16 MED ORDER — HYDROMORPHONE HCL 1 MG/ML IJ SOLN
1.0000 mg | Freq: Once | INTRAMUSCULAR | Status: AC
  Administered 2023-01-16: 1 mg via INTRAVENOUS
  Filled 2023-01-16: qty 1

## 2023-01-16 MED ORDER — MELATONIN 3 MG PO TABS: 3.0000 mg | ORAL_TABLET | Freq: Every evening | ORAL | Status: DC | PRN

## 2023-01-16 MED ORDER — ONDANSETRON 4 MG PO TBDP: 4.0000 mg | ORAL_TABLET | Freq: Four times a day (QID) | ORAL | Status: DC | PRN

## 2023-01-16 MED ORDER — ACETAMINOPHEN 500 MG PO TABS
1000.0000 mg | ORAL_TABLET | Freq: Three times a day (TID) | ORAL | Status: DC
  Administered 2023-01-17 – 2023-01-19 (×8): 1000 mg via ORAL
  Filled 2023-01-16 (×8): qty 2

## 2023-01-16 MED ORDER — METHOCARBAMOL 500 MG PO TABS
500.0000 mg | ORAL_TABLET | Freq: Three times a day (TID) | ORAL | Status: DC | PRN
  Administered 2023-01-17 – 2023-01-19 (×6): 500 mg via ORAL
  Filled 2023-01-16 (×6): qty 1

## 2023-01-16 MED ORDER — DOCUSATE SODIUM 100 MG PO CAPS
100.0000 mg | ORAL_CAPSULE | Freq: Two times a day (BID) | ORAL | Status: DC
  Administered 2023-01-17 – 2023-01-19 (×5): 100 mg via ORAL
  Filled 2023-01-16 (×6): qty 1

## 2023-01-16 MED ORDER — PRAVASTATIN SODIUM 10 MG PO TABS
10.0000 mg | ORAL_TABLET | Freq: Every day | ORAL | Status: DC
  Administered 2023-01-17 – 2023-01-19 (×3): 10 mg via ORAL
  Filled 2023-01-16 (×3): qty 1

## 2023-01-16 MED ORDER — ONDANSETRON HCL 4 MG/2ML IJ SOLN: 4.0000 mg | Freq: Four times a day (QID) | INTRAMUSCULAR | Status: DC | PRN

## 2023-01-16 MED ORDER — ONDANSETRON HCL 4 MG/2ML IJ SOLN
4.0000 mg | Freq: Once | INTRAMUSCULAR | Status: AC
  Administered 2023-01-16: 4 mg via INTRAVENOUS
  Filled 2023-01-16: qty 2

## 2023-01-16 MED ORDER — METHOCARBAMOL 1000 MG/10ML IJ SOLN: 500.0000 mg | Freq: Three times a day (TID) | INTRAVENOUS | Status: DC | PRN
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
## 2023-01-16 NOTE — ED Notes (Signed)
Trauma attending at bedside evaluating patient

## 2023-01-17 ENCOUNTER — Encounter (HOSPITAL_COMMUNITY): Payer: Self-pay | Admitting: Surgery

## 2023-01-17 DIAGNOSIS — S2241XA Multiple fractures of ribs, right side, initial encounter for closed fracture: Secondary | ICD-10-CM | POA: Diagnosis not present

## 2023-01-17 DIAGNOSIS — S06330A Contusion and laceration of cerebrum, unspecified, without loss of consciousness, initial encounter: Secondary | ICD-10-CM | POA: Diagnosis not present

## 2023-01-17 DIAGNOSIS — S22039A Unspecified fracture of third thoracic vertebra, initial encounter for closed fracture: Secondary | ICD-10-CM | POA: Diagnosis not present

## 2023-01-17 DIAGNOSIS — S0633AA Contusion and laceration of cerebrum, unspecified, with loss of consciousness status unknown, initial encounter: Secondary | ICD-10-CM | POA: Diagnosis not present

## 2023-01-17 LAB — CBG MONITORING, ED
Glucose-Capillary: 142 mg/dL — ABNORMAL HIGH (ref 70–99)
Glucose-Capillary: 192 mg/dL — ABNORMAL HIGH (ref 70–99)
Glucose-Capillary: 267 mg/dL — ABNORMAL HIGH (ref 70–99)

## 2023-01-17 LAB — HIV ANTIBODY (ROUTINE TESTING W REFLEX): HIV Screen 4th Generation wRfx: NONREACTIVE

## 2023-01-17 LAB — GLUCOSE, CAPILLARY
Glucose-Capillary: 163 mg/dL — ABNORMAL HIGH (ref 70–99)
Glucose-Capillary: 247 mg/dL — ABNORMAL HIGH (ref 70–99)
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
## 2023-01-18 DIAGNOSIS — S06330A Contusion and laceration of cerebrum, unspecified, without loss of consciousness, initial encounter: Secondary | ICD-10-CM | POA: Diagnosis not present

## 2023-01-18 DIAGNOSIS — S2241XA Multiple fractures of ribs, right side, initial encounter for closed fracture: Secondary | ICD-10-CM | POA: Diagnosis not present

## 2023-01-18 LAB — GLUCOSE, CAPILLARY
Glucose-Capillary: 303 mg/dL — ABNORMAL HIGH (ref 70–99)
Glucose-Capillary: 178 mg/dL — ABNORMAL HIGH (ref 70–99)
Glucose-Capillary: 113 mg/dL — ABNORMAL HIGH (ref 70–99)
Glucose-Capillary: 385 mg/dL — ABNORMAL HIGH (ref 70–99)
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
## 2023-01-19 ENCOUNTER — Telehealth: Payer: Self-pay | Admitting: Physician Assistant

## 2023-01-19 ENCOUNTER — Other Ambulatory Visit (HOSPITAL_COMMUNITY): Payer: Self-pay

## 2023-01-19 DIAGNOSIS — S2249XA Multiple fractures of ribs, unspecified side, initial encounter for closed fracture: Secondary | ICD-10-CM | POA: Diagnosis not present

## 2023-01-19 DIAGNOSIS — S06330A Contusion and laceration of cerebrum, unspecified, without loss of consciousness, initial encounter: Secondary | ICD-10-CM | POA: Diagnosis not present

## 2023-01-19 DIAGNOSIS — S2241XA Multiple fractures of ribs, right side, initial encounter for closed fracture: Secondary | ICD-10-CM | POA: Diagnosis not present

## 2023-01-19 LAB — GLUCOSE, CAPILLARY
Glucose-Capillary: 197 mg/dL — ABNORMAL HIGH (ref 70–99)
Glucose-Capillary: 342 mg/dL — ABNORMAL HIGH (ref 70–99)

## 2023-01-19 MED ORDER — ACETAMINOPHEN 500 MG PO TABS
1000.0000 mg | ORAL_TABLET | Freq: Four times a day (QID) | ORAL | 0 refills | Status: AC | PRN
  Filled 2023-01-19: qty 30, 4d supply, fill #0

## 2023-01-19 MED ORDER — OXYCODONE HCL 5 MG PO TABS
5.0000 mg | ORAL_TABLET | Freq: Four times a day (QID) | ORAL | 0 refills | Status: AC | PRN
  Filled 2023-01-19: qty 25, 7d supply, fill #0

## 2023-01-19 MED ORDER — DOCUSATE SODIUM 100 MG PO CAPS
100.0000 mg | ORAL_CAPSULE | Freq: Two times a day (BID) | ORAL | 0 refills | Status: AC
  Filled 2023-01-19: qty 10, 5d supply, fill #0

## 2023-01-19 MED ORDER — METHOCARBAMOL 500 MG PO TABS
500.0000 mg | ORAL_TABLET | Freq: Three times a day (TID) | ORAL | 0 refills | Status: DC | PRN
  Filled 2023-01-19: qty 40, 14d supply, fill #0

## 2023-01-19 MED ORDER — POLYETHYLENE GLYCOL 3350 17 G PO PACK
17.0000 g | PACK | Freq: Every day | ORAL | Status: DC
  Administered 2023-01-19: 17 g via ORAL
  Filled 2023-01-19: qty 1

## 2023-01-19 MED ORDER — POLYETHYLENE GLYCOL 3350 17 GM/SCOOP PO POWD
17.0000 g | Freq: Every day | ORAL | 0 refills | Status: AC
  Filled 2023-01-19: qty 238, 14d supply, fill #0

## 2023-01-19 NOTE — Telephone Encounter (Signed)
Received Physical Therapy order forms. Gave to [place] for signature-nm

## 2023-01-19 NOTE — Telephone Encounter (Signed)
[facility] order signed. Faxed [id number]. To be scanned-nm

## 2023-01-19 NOTE — Discharge Summary (Incomplete)
[facility] Discharge Summary   Patient ID: Regina Short MRN: [id number] DOB/AGE: 1961/08/19 62 y.o.  Admit date: 01/16/2023 Discharge date: 01/19/2023  Admitting Diagnosis: ***  Discharge Diagnosis Patient Active Problem List   Diagnosis Date Noted   Multiple rib fractures 01/16/2023   Optic neuritis 10/08/2021   Vision disturbance 63/12/6008   Complicated migraine with status migrainosus 10/02/2021   Smoking 01/04/2021   Acute upper respiratory infection 11/25/2020   Pain in both wrists 11/25/2020   Varicose veins of both lower extremities with inflammation 10/14/2020   Routine cervical smear 10/14/2020   Encounter for screening mammogram for malignant neoplasm of breast 10/14/2020   Flu vaccine need 10/14/2020   Leg cramps 01/29/2020   Pain in both feet 11/09/2019   Allergic rhinitis due to pollen 05/21/2019   Contact dermatitis and eczema due to detergents 05/18/2019   Left knee pain 02/18/2019   Vitamin D deficiency 11/12/2018   Osteoarthritis of knee 07/16/2018   Ingrown toenail without infection 07/09/2018   Dysuria 07/09/2018   Uncontrolled type 2 diabetes mellitus with hyperglycemia ([place]) 04/02/2018   Essential hypertension 04/02/2018   Type 2 diabetes mellitus with hyperglycemia, without long-term current use of insulin ([place]) 04/02/2018   Diabetes mellitus without complication ([place]) 93/23/5573   Encounter for general adult medical examination with abnormal findings 04/02/2018   Acquired trigger finger 11/07/2016   Pes anserinus bursitis 08/03/2015   SBO (small bowel obstruction) (HCC)    Obstruction of intestine ([place]) 07/25/2015   Impingement syndrome of left shoulder 02/23/2015   Left supraspinatus tenosynovitis 02/23/2015    Consultants *** Imaging: No results found.  Procedures Dr. Marland Kitchen Marland Kitchen/**/17) - Laparoscopic Cholecystectomy with IOC Dr. Marland Kitchen Marland Kitchen/**/17) - Laparoscopic Appendectomy  Hospital Course:  *** who presented to [facility] with ***.   Workup showed ***.  Patient was admitted and underwent procedure listed above.  Tolerated procedure well and was transferred to the floor.  Diet was advanced as tolerated.  On POD***, the patient was voiding well, tolerating diet, ambulating well, pain well controlled, vital signs stable, incisions c/d/i and felt stable for discharge home.  Patient will follow up in our office in 2 weeks and knows to call with questions or concerns.  He/She*** will call to confirm appointment date/time.    Physical Exam: General:  Alert, NAD, pleasant, comfortable Abd:  Soft, ND, mild tenderness, incisions C/D/I, drain with minimal sanguinous drainage ***   Allergies as of 01/19/2023       Reactions   Micardis [telmisartan]    rash   Ciprofloxacin Rash   Tramadol Rash        Medication List     TAKE these medications    Accu-Chek FastClix Lancets Misc Use as directed to check blood sugars twice a day  E11.65   Accu-Chek Guide test strip Generic drug: glucose blood USE TO TEST BLOOD SUGARS TWICE A DAY AS NEEDED E11.65   Acetaminophen Extra Strength 500 MG Tabs Take 2 tablets (1,000 mg total) by mouth every 6 (six) hours as needed.   benazepril 10 MG tablet Commonly known as: LOTENSIN Take 1 tablet (10 mg total) by mouth daily.   Cholecalciferol 125 MCG (5000 UT) capsule Take 5,000 Units by mouth daily.   docusate sodium 100 MG capsule Commonly known as: COLACE Take 1 capsule (100 mg total) by mouth 2 (two) times daily.   glyBURIDE-metformin 5-500 MG tablet Commonly known as: GLUCOVANCE TAKE 1.5 TABLET BY MOUTH TWICE A DAY OR IF BLOOD SUGAR DROPS  TAKE 1 TABLET TWICE A DAY What changed:  how much to take how to take this when to take this   lidocaine 5 % Commonly known as: Lidoderm Place 1 patch onto the skin every 12 (twelve) hours. Remove & Discard patch within 12 hours or as directed by MD   methocarbamol 500 MG tablet Commonly known as: ROBAXIN Take 1 tablet (500 mg total) by  mouth every 8 (eight) hours as needed for muscle spasms. What changed: when to take this   oxyCODONE 5 MG immediate release tablet Commonly known as: Oxy IR/ROXICODONE Take 1 tablet (5 mg total) by mouth every 6 (six) hours as needed for up to 7 days for severe pain.   polyethylene glycol powder 17 GM/SCOOP powder Commonly known as: GLYCOLAX/MIRALAX Take 17 g by mouth daily.   pravastatin 10 MG tablet Commonly known as: PRAVACHOL TAKE 1 TABLET BY MOUTH EVERY DAY   sitaGLIPtin 100 MG tablet Commonly known as: Januvia Take 1 tablet (100 mg total) by mouth daily.               Durable Medical Equipment  (From admission, onward)           Start     Ordered   01/19/23 0913  For home use only DME Walker rolling  Once       Question Answer Comment  Walker: With [place] Wheels   Patient needs a walker to treat with the following condition Multiple rib fractures      01/19/23 0912   01/19/23 0913  For home use only DME 3 n 1  Once        01/19/23 0912   01/18/23 0919  For home use only DME 3 n 1  Once        01/18/23 0919              Follow-up Information     [facility]. Call.   Specialty: Rehabilitation Why: Call ASAP to schedule outpatient physical and speech therapy appts.  An electronic referral has been made on your behalf. Contact information: [place] [phone] ar [place] [phone]                Signed: Obie Dredge, [facility] Surgery 01/19/2023, 2:55 PM

## 2023-01-19 NOTE — Progress Notes (Signed)
Mobility Specialist - Progress Note   01/19/23 0900  Mobility  Activity Ambulated with assistance in hallway  Level of Assistance Contact guard assist, steadying assist  Assistive Device Other (Comment) (hallway rails)  Distance Ambulated (ft) 80 ft  Activity Response Tolerated well  Mobility Referral Yes  $Mobility charge 1 Mobility    Pt received in recliner agreeable to mobility. No complaints throughout. Left in recliner w/ call bell in reach.   [place] Specialist Please contact via SecureChat or Rehab office at [phone]
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
## 2023-01-22 ENCOUNTER — Other Ambulatory Visit: Payer: Self-pay | Admitting: Physician Assistant

## 2023-01-27 ENCOUNTER — Ambulatory Visit: Payer: 59 | Attending: Physician Assistant | Admitting: Speech Pathology

## 2023-01-27 DIAGNOSIS — R2689 Other abnormalities of gait and mobility: Secondary | ICD-10-CM | POA: Insufficient documentation

## 2023-01-27 DIAGNOSIS — M79604 Pain in right leg: Secondary | ICD-10-CM | POA: Insufficient documentation

## 2023-01-27 DIAGNOSIS — R41841 Cognitive communication deficit: Secondary | ICD-10-CM | POA: Insufficient documentation

## 2023-01-27 DIAGNOSIS — M546 Pain in thoracic spine: Secondary | ICD-10-CM | POA: Diagnosis not present

## 2023-01-27 DIAGNOSIS — R262 Difficulty in walking, not elsewhere classified: Secondary | ICD-10-CM | POA: Diagnosis not present

## 2023-01-27 DIAGNOSIS — M6281 Muscle weakness (generalized): Secondary | ICD-10-CM | POA: Insufficient documentation

## 2023-01-27 DIAGNOSIS — S2249XA Multiple fractures of ribs, unspecified side, initial encounter for closed fracture: Secondary | ICD-10-CM | POA: Insufficient documentation

## 2023-01-27 DIAGNOSIS — M549 Dorsalgia, unspecified: Secondary | ICD-10-CM | POA: Diagnosis not present

## 2023-01-27 DIAGNOSIS — M79605 Pain in left leg: Secondary | ICD-10-CM | POA: Diagnosis not present

## 2023-01-27 DIAGNOSIS — Q766 Other congenital malformations of ribs: Secondary | ICD-10-CM | POA: Diagnosis not present
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
## 2023-01-29 ENCOUNTER — Ambulatory Visit: Payer: 59 | Admitting: Speech Pathology

## 2023-01-29 ENCOUNTER — Telehealth: Payer: Self-pay

## 2023-01-29 DIAGNOSIS — M79604 Pain in right leg: Secondary | ICD-10-CM | POA: Diagnosis not present

## 2023-01-29 DIAGNOSIS — S2249XA Multiple fractures of ribs, unspecified side, initial encounter for closed fracture: Secondary | ICD-10-CM | POA: Diagnosis not present

## 2023-01-29 DIAGNOSIS — M6281 Muscle weakness (generalized): Secondary | ICD-10-CM | POA: Diagnosis not present

## 2023-01-29 DIAGNOSIS — R41841 Cognitive communication deficit: Secondary | ICD-10-CM | POA: Diagnosis not present

## 2023-01-29 DIAGNOSIS — M79605 Pain in left leg: Secondary | ICD-10-CM | POA: Diagnosis not present

## 2023-01-29 DIAGNOSIS — R262 Difficulty in walking, not elsewhere classified: Secondary | ICD-10-CM | POA: Diagnosis not present

## 2023-01-29 DIAGNOSIS — M546 Pain in thoracic spine: Secondary | ICD-10-CM | POA: Diagnosis not present

## 2023-01-29 DIAGNOSIS — Q766 Other congenital malformations of ribs: Secondary | ICD-10-CM | POA: Diagnosis not present

## 2023-01-29 DIAGNOSIS — M549 Dorsalgia, unspecified: Secondary | ICD-10-CM | POA: Diagnosis not present

## 2023-01-29 DIAGNOSIS — R2689 Other abnormalities of gait and mobility: Secondary | ICD-10-CM | POA: Diagnosis not present

## 2023-01-29 NOTE — Telephone Encounter (Signed)
Pt called that need refills for tylenol 500 mg refills as per lauren advised that she can take OTC

## 2023-01-30 ENCOUNTER — Other Ambulatory Visit: Payer: Self-pay | Admitting: Physician Assistant

## 2023-01-30 DIAGNOSIS — I10 Essential (primary) hypertension: Secondary | ICD-10-CM
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
## 2023-02-02 ENCOUNTER — Ambulatory Visit: Payer: Self-pay | Admitting: Physician Assistant

## 2023-02-02 ENCOUNTER — Ambulatory Visit: Payer: 59 | Admitting: Speech Pathology

## 2023-02-02 ENCOUNTER — Encounter: Payer: Self-pay | Admitting: Physician Assistant

## 2023-02-02 VITALS — BP 127/78 | HR 74 | Temp 98.3°F | Resp 16 | Ht 66.0 in | Wt 151.0 lb

## 2023-02-02 DIAGNOSIS — Z09 Encounter for follow-up examination after completed treatment for conditions other than malignant neoplasm: Secondary | ICD-10-CM

## 2023-02-02 DIAGNOSIS — S2241XD Multiple fractures of ribs, right side, subsequent encounter for fracture with routine healing: Secondary | ICD-10-CM

## 2023-02-02 MED ORDER — METHOCARBAMOL 500 MG PO TABS: 500.0000 mg | ORAL_TABLET | Freq: Three times a day (TID) | ORAL | 0 refills | Status: DC | PRN

## 2023-02-02 MED ORDER — TRAMADOL HCL 50 MG PO TABS: 50.0000 mg | ORAL_TABLET | Freq: Three times a day (TID) | ORAL | 0 refills | Status: AC | PRN
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
## 2023-02-03 ENCOUNTER — Telehealth: Payer: Self-pay

## 2023-02-03 NOTE — Telephone Encounter (Signed)
Completed P.A. for patient's Tramadol.

## 2023-02-04 ENCOUNTER — Ambulatory Visit: Payer: 59

## 2023-02-04 DIAGNOSIS — M79604 Pain in right leg: Secondary | ICD-10-CM | POA: Diagnosis not present

## 2023-02-04 DIAGNOSIS — M79605 Pain in left leg: Secondary | ICD-10-CM | POA: Diagnosis not present

## 2023-02-04 DIAGNOSIS — M549 Dorsalgia, unspecified: Secondary | ICD-10-CM

## 2023-02-04 DIAGNOSIS — R2689 Other abnormalities of gait and mobility: Secondary | ICD-10-CM

## 2023-02-04 DIAGNOSIS — M6281 Muscle weakness (generalized): Secondary | ICD-10-CM | POA: Diagnosis not present

## 2023-02-04 DIAGNOSIS — M546 Pain in thoracic spine: Secondary | ICD-10-CM | POA: Diagnosis not present

## 2023-02-04 DIAGNOSIS — Q766 Other congenital malformations of ribs: Secondary | ICD-10-CM | POA: Diagnosis not present

## 2023-02-04 DIAGNOSIS — R262 Difficulty in walking, not elsewhere classified: Secondary | ICD-10-CM | POA: Diagnosis not present

## 2023-02-04 DIAGNOSIS — R41841 Cognitive communication deficit: Secondary | ICD-10-CM | POA: Diagnosis not present

## 2023-02-04 DIAGNOSIS — S2249XA Multiple fractures of ribs, unspecified side, initial encounter for closed fracture: Secondary | ICD-10-CM | POA: Diagnosis not present
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
## 2023-02-05 ENCOUNTER — Ambulatory Visit: Payer: 59 | Admitting: Speech Pathology

## 2023-02-05 DIAGNOSIS — R41841 Cognitive communication deficit: Secondary | ICD-10-CM | POA: Diagnosis not present

## 2023-02-05 DIAGNOSIS — M549 Dorsalgia, unspecified: Secondary | ICD-10-CM | POA: Diagnosis not present

## 2023-02-05 DIAGNOSIS — M79605 Pain in left leg: Secondary | ICD-10-CM | POA: Diagnosis not present

## 2023-02-05 DIAGNOSIS — Q766 Other congenital malformations of ribs: Secondary | ICD-10-CM | POA: Diagnosis not present

## 2023-02-05 DIAGNOSIS — M6281 Muscle weakness (generalized): Secondary | ICD-10-CM | POA: Diagnosis not present

## 2023-02-05 DIAGNOSIS — M79604 Pain in right leg: Secondary | ICD-10-CM | POA: Diagnosis not present

## 2023-02-05 DIAGNOSIS — M546 Pain in thoracic spine: Secondary | ICD-10-CM | POA: Diagnosis not present

## 2023-02-05 DIAGNOSIS — S2249XA Multiple fractures of ribs, unspecified side, initial encounter for closed fracture: Secondary | ICD-10-CM | POA: Diagnosis not present

## 2023-02-05 DIAGNOSIS — R2689 Other abnormalities of gait and mobility: Secondary | ICD-10-CM | POA: Diagnosis not present

## 2023-02-05 DIAGNOSIS — R262 Difficulty in walking, not elsewhere classified: Secondary | ICD-10-CM | POA: Diagnosis not present
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
## 2023-02-06 ENCOUNTER — Telehealth: Payer: Self-pay | Admitting: Physician Assistant

## 2023-02-06 NOTE — Telephone Encounter (Signed)
Received MR request from [place]

## 2023-02-09 ENCOUNTER — Ambulatory Visit: Payer: 59 | Admitting: Speech Pathology

## 2023-02-09 DIAGNOSIS — Q766 Other congenital malformations of ribs: Secondary | ICD-10-CM | POA: Diagnosis not present

## 2023-02-09 DIAGNOSIS — R2689 Other abnormalities of gait and mobility: Secondary | ICD-10-CM | POA: Diagnosis not present

## 2023-02-09 DIAGNOSIS — R41841 Cognitive communication deficit: Secondary | ICD-10-CM

## 2023-02-09 DIAGNOSIS — M79604 Pain in right leg: Secondary | ICD-10-CM | POA: Diagnosis not present

## 2023-02-09 DIAGNOSIS — M79605 Pain in left leg: Secondary | ICD-10-CM | POA: Diagnosis not present

## 2023-02-09 DIAGNOSIS — R262 Difficulty in walking, not elsewhere classified: Secondary | ICD-10-CM | POA: Diagnosis not present

## 2023-02-09 DIAGNOSIS — M6281 Muscle weakness (generalized): Secondary | ICD-10-CM | POA: Diagnosis not present

## 2023-02-09 DIAGNOSIS — M549 Dorsalgia, unspecified: Secondary | ICD-10-CM | POA: Diagnosis not present

## 2023-02-09 DIAGNOSIS — S2249XA Multiple fractures of ribs, unspecified side, initial encounter for closed fracture: Secondary | ICD-10-CM | POA: Diagnosis not present

## 2023-02-09 DIAGNOSIS — M546 Pain in thoracic spine: Secondary | ICD-10-CM | POA: Diagnosis not present
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
## 2023-02-11 ENCOUNTER — Ambulatory Visit: Payer: 59 | Admitting: Speech Pathology

## 2023-02-11 DIAGNOSIS — R41841 Cognitive communication deficit: Secondary | ICD-10-CM

## 2023-02-11 DIAGNOSIS — M6281 Muscle weakness (generalized): Secondary | ICD-10-CM | POA: Diagnosis not present

## 2023-02-11 DIAGNOSIS — M79605 Pain in left leg: Secondary | ICD-10-CM | POA: Diagnosis not present

## 2023-02-11 DIAGNOSIS — R2689 Other abnormalities of gait and mobility: Secondary | ICD-10-CM | POA: Diagnosis not present

## 2023-02-11 DIAGNOSIS — M79604 Pain in right leg: Secondary | ICD-10-CM | POA: Diagnosis not present

## 2023-02-11 DIAGNOSIS — M546 Pain in thoracic spine: Secondary | ICD-10-CM | POA: Diagnosis not present

## 2023-02-11 DIAGNOSIS — R262 Difficulty in walking, not elsewhere classified: Secondary | ICD-10-CM | POA: Diagnosis not present

## 2023-02-11 DIAGNOSIS — M549 Dorsalgia, unspecified: Secondary | ICD-10-CM | POA: Diagnosis not present

## 2023-02-11 DIAGNOSIS — Q766 Other congenital malformations of ribs: Secondary | ICD-10-CM | POA: Diagnosis not present

## 2023-02-11 DIAGNOSIS — S2249XA Multiple fractures of ribs, unspecified side, initial encounter for closed fracture: Secondary | ICD-10-CM | POA: Diagnosis not present
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
## 2023-02-16 ENCOUNTER — Ambulatory Visit: Payer: 59 | Admitting: Speech Pathology

## 2023-02-16 ENCOUNTER — Ambulatory Visit: Payer: 59

## 2023-02-16 DIAGNOSIS — Q766 Other congenital malformations of ribs: Secondary | ICD-10-CM | POA: Diagnosis not present

## 2023-02-16 DIAGNOSIS — M6281 Muscle weakness (generalized): Secondary | ICD-10-CM | POA: Diagnosis not present

## 2023-02-16 DIAGNOSIS — S2249XA Multiple fractures of ribs, unspecified side, initial encounter for closed fracture: Secondary | ICD-10-CM | POA: Diagnosis not present

## 2023-02-16 DIAGNOSIS — M546 Pain in thoracic spine: Secondary | ICD-10-CM | POA: Diagnosis not present

## 2023-02-16 DIAGNOSIS — M549 Dorsalgia, unspecified: Secondary | ICD-10-CM | POA: Diagnosis not present

## 2023-02-16 DIAGNOSIS — R41841 Cognitive communication deficit: Secondary | ICD-10-CM

## 2023-02-16 DIAGNOSIS — M79605 Pain in left leg: Secondary | ICD-10-CM | POA: Diagnosis not present

## 2023-02-16 DIAGNOSIS — R262 Difficulty in walking, not elsewhere classified: Secondary | ICD-10-CM

## 2023-02-16 DIAGNOSIS — M79604 Pain in right leg: Secondary | ICD-10-CM | POA: Diagnosis not present

## 2023-02-16 DIAGNOSIS — R2689 Other abnormalities of gait and mobility: Secondary | ICD-10-CM | POA: Diagnosis not present
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
## 2023-02-18 ENCOUNTER — Ambulatory Visit: Payer: 59 | Admitting: Speech Pathology

## 2023-02-18 ENCOUNTER — Other Ambulatory Visit: Payer: Self-pay | Admitting: Physician Assistant

## 2023-02-18 ENCOUNTER — Ambulatory Visit: Payer: 59

## 2023-02-18 ENCOUNTER — Telehealth: Payer: Self-pay

## 2023-02-18 DIAGNOSIS — M79605 Pain in left leg: Secondary | ICD-10-CM | POA: Diagnosis not present

## 2023-02-18 DIAGNOSIS — R262 Difficulty in walking, not elsewhere classified: Secondary | ICD-10-CM

## 2023-02-18 DIAGNOSIS — M6281 Muscle weakness (generalized): Secondary | ICD-10-CM

## 2023-02-18 DIAGNOSIS — S2241XD Multiple fractures of ribs, right side, subsequent encounter for fracture with routine healing: Secondary | ICD-10-CM

## 2023-02-18 DIAGNOSIS — M546 Pain in thoracic spine: Secondary | ICD-10-CM | POA: Diagnosis not present

## 2023-02-18 DIAGNOSIS — M549 Dorsalgia, unspecified: Secondary | ICD-10-CM

## 2023-02-18 DIAGNOSIS — R41841 Cognitive communication deficit: Secondary | ICD-10-CM | POA: Diagnosis not present

## 2023-02-18 DIAGNOSIS — S2249XA Multiple fractures of ribs, unspecified side, initial encounter for closed fracture: Secondary | ICD-10-CM | POA: Diagnosis not present

## 2023-02-18 DIAGNOSIS — M79604 Pain in right leg: Secondary | ICD-10-CM

## 2023-02-18 DIAGNOSIS — Q766 Other congenital malformations of ribs: Secondary | ICD-10-CM | POA: Diagnosis not present

## 2023-02-18 DIAGNOSIS — R2689 Other abnormalities of gait and mobility: Secondary | ICD-10-CM | POA: Diagnosis not present

## 2023-02-18 MED ORDER — TRAMADOL HCL 50 MG PO TABS: 50.0000 mg | ORAL_TABLET | Freq: Every day | ORAL | 0 refills | Status: DC | PRN
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
## 2023-02-18 NOTE — Telephone Encounter (Signed)
Patient notified
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
## 2023-02-19 ENCOUNTER — Telehealth: Payer: Self-pay

## 2023-02-19 NOTE — Telephone Encounter (Signed)
Sent P.A. for patient's Glipizide.

## 2023-02-23 ENCOUNTER — Ambulatory Visit: Payer: 59 | Admitting: Speech Pathology

## 2023-02-25 ENCOUNTER — Ambulatory Visit: Payer: 59

## 2023-02-25 ENCOUNTER — Ambulatory Visit: Payer: 59 | Attending: Physician Assistant | Admitting: Speech Pathology

## 2023-02-25 DIAGNOSIS — M79604 Pain in right leg: Secondary | ICD-10-CM | POA: Insufficient documentation

## 2023-02-25 DIAGNOSIS — M546 Pain in thoracic spine: Secondary | ICD-10-CM | POA: Insufficient documentation

## 2023-02-25 DIAGNOSIS — R2689 Other abnormalities of gait and mobility: Secondary | ICD-10-CM

## 2023-02-25 DIAGNOSIS — Q766 Other congenital malformations of ribs: Secondary | ICD-10-CM | POA: Insufficient documentation

## 2023-02-25 DIAGNOSIS — M79605 Pain in left leg: Secondary | ICD-10-CM | POA: Diagnosis not present

## 2023-02-25 DIAGNOSIS — M6281 Muscle weakness (generalized): Secondary | ICD-10-CM

## 2023-02-25 DIAGNOSIS — R262 Difficulty in walking, not elsewhere classified: Secondary | ICD-10-CM

## 2023-02-25 DIAGNOSIS — M549 Dorsalgia, unspecified: Secondary | ICD-10-CM | POA: Insufficient documentation

## 2023-02-25 DIAGNOSIS — R41841 Cognitive communication deficit: Secondary | ICD-10-CM | POA: Insufficient documentation
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
## 2023-02-26 ENCOUNTER — Ambulatory Visit: Payer: 59 | Admitting: Speech Pathology
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
## 2023-03-02 ENCOUNTER — Ambulatory Visit: Payer: 59 | Admitting: Speech Pathology

## 2023-03-02 DIAGNOSIS — R41841 Cognitive communication deficit: Secondary | ICD-10-CM

## 2023-03-02 DIAGNOSIS — M6281 Muscle weakness (generalized): Secondary | ICD-10-CM | POA: Diagnosis not present

## 2023-03-02 DIAGNOSIS — M79605 Pain in left leg: Secondary | ICD-10-CM | POA: Diagnosis not present

## 2023-03-02 DIAGNOSIS — M546 Pain in thoracic spine: Secondary | ICD-10-CM | POA: Diagnosis not present

## 2023-03-02 DIAGNOSIS — M549 Dorsalgia, unspecified: Secondary | ICD-10-CM | POA: Diagnosis not present

## 2023-03-02 DIAGNOSIS — Q766 Other congenital malformations of ribs: Secondary | ICD-10-CM | POA: Diagnosis not present

## 2023-03-02 DIAGNOSIS — R262 Difficulty in walking, not elsewhere classified: Secondary | ICD-10-CM | POA: Diagnosis not present

## 2023-03-02 DIAGNOSIS — R2689 Other abnormalities of gait and mobility: Secondary | ICD-10-CM | POA: Diagnosis not present

## 2023-03-02 DIAGNOSIS — M79604 Pain in right leg: Secondary | ICD-10-CM | POA: Diagnosis not present
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
## 2023-03-03 ENCOUNTER — Other Ambulatory Visit: Payer: Self-pay

## 2023-03-03 MED ORDER — GLYBURIDE-METFORMIN 5-500 MG PO TABS: ORAL_TABLET | ORAL | 5 refills | Status: DC

## 2023-03-05 ENCOUNTER — Ambulatory Visit: Payer: 59 | Admitting: Speech Pathology

## 2023-03-05 DIAGNOSIS — M79604 Pain in right leg: Secondary | ICD-10-CM | POA: Diagnosis not present

## 2023-03-05 DIAGNOSIS — R41841 Cognitive communication deficit: Secondary | ICD-10-CM

## 2023-03-05 DIAGNOSIS — Q766 Other congenital malformations of ribs: Secondary | ICD-10-CM | POA: Diagnosis not present

## 2023-03-05 DIAGNOSIS — M549 Dorsalgia, unspecified: Secondary | ICD-10-CM | POA: Diagnosis not present

## 2023-03-05 DIAGNOSIS — M79605 Pain in left leg: Secondary | ICD-10-CM | POA: Diagnosis not present

## 2023-03-05 DIAGNOSIS — R2689 Other abnormalities of gait and mobility: Secondary | ICD-10-CM | POA: Diagnosis not present

## 2023-03-05 DIAGNOSIS — M6281 Muscle weakness (generalized): Secondary | ICD-10-CM | POA: Diagnosis not present

## 2023-03-05 DIAGNOSIS — R262 Difficulty in walking, not elsewhere classified: Secondary | ICD-10-CM | POA: Diagnosis not present

## 2023-03-05 DIAGNOSIS — M546 Pain in thoracic spine: Secondary | ICD-10-CM | POA: Diagnosis not present
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
## 2023-03-09 ENCOUNTER — Encounter: Payer: 59 | Admitting: Speech Pathology

## 2023-03-09 ENCOUNTER — Telehealth: Payer: Self-pay | Admitting: Physician Assistant

## 2023-03-09 NOTE — Telephone Encounter (Signed)
31 pages of MR faxed to [facility]; [phone]

## 2023-03-10 ENCOUNTER — Encounter: Payer: 59 | Admitting: Speech Pathology

## 2023-03-11 ENCOUNTER — Ambulatory Visit: Payer: 59

## 2023-03-11 ENCOUNTER — Encounter: Payer: 59 | Admitting: Speech Pathology

## 2023-03-11 ENCOUNTER — Ambulatory Visit: Payer: 59 | Admitting: Speech Pathology

## 2023-03-11 DIAGNOSIS — M79605 Pain in left leg: Secondary | ICD-10-CM | POA: Diagnosis not present

## 2023-03-11 DIAGNOSIS — M6281 Muscle weakness (generalized): Secondary | ICD-10-CM | POA: Diagnosis not present

## 2023-03-11 DIAGNOSIS — M549 Dorsalgia, unspecified: Secondary | ICD-10-CM

## 2023-03-11 DIAGNOSIS — M79604 Pain in right leg: Secondary | ICD-10-CM

## 2023-03-11 DIAGNOSIS — Q766 Other congenital malformations of ribs: Secondary | ICD-10-CM | POA: Diagnosis not present

## 2023-03-11 DIAGNOSIS — R2689 Other abnormalities of gait and mobility: Secondary | ICD-10-CM | POA: Diagnosis not present

## 2023-03-11 DIAGNOSIS — M546 Pain in thoracic spine: Secondary | ICD-10-CM

## 2023-03-11 DIAGNOSIS — R41841 Cognitive communication deficit: Secondary | ICD-10-CM

## 2023-03-11 DIAGNOSIS — R262 Difficulty in walking, not elsewhere classified: Secondary | ICD-10-CM
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
## 2023-03-12 ENCOUNTER — Telehealth: Payer: Self-pay | Admitting: Physician Assistant

## 2023-03-12 ENCOUNTER — Ambulatory Visit: Payer: 59 | Admitting: Speech Pathology

## 2023-03-12 NOTE — Telephone Encounter (Signed)
Itemized statement emailed to [employer] with [employer]

## 2023-03-16 ENCOUNTER — Other Ambulatory Visit: Payer: Self-pay | Admitting: Physician Assistant

## 2023-03-16 DIAGNOSIS — E1165 Type 2 diabetes mellitus with hyperglycemia: Secondary | ICD-10-CM

## 2023-03-18 ENCOUNTER — Ambulatory Visit: Payer: 59

## 2023-03-18 DIAGNOSIS — R2689 Other abnormalities of gait and mobility: Secondary | ICD-10-CM

## 2023-03-18 DIAGNOSIS — M79605 Pain in left leg: Secondary | ICD-10-CM | POA: Diagnosis not present

## 2023-03-18 DIAGNOSIS — R262 Difficulty in walking, not elsewhere classified: Secondary | ICD-10-CM | POA: Diagnosis not present

## 2023-03-18 DIAGNOSIS — M79604 Pain in right leg: Secondary | ICD-10-CM

## 2023-03-18 DIAGNOSIS — M549 Dorsalgia, unspecified: Secondary | ICD-10-CM

## 2023-03-18 DIAGNOSIS — R41841 Cognitive communication deficit: Secondary | ICD-10-CM | POA: Diagnosis not present

## 2023-03-18 DIAGNOSIS — Q766 Other congenital malformations of ribs: Secondary | ICD-10-CM | POA: Diagnosis not present

## 2023-03-18 DIAGNOSIS — M6281 Muscle weakness (generalized): Secondary | ICD-10-CM

## 2023-03-18 DIAGNOSIS — M546 Pain in thoracic spine: Secondary | ICD-10-CM
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
## 2023-03-20 ENCOUNTER — Ambulatory Visit: Payer: 59 | Admitting: Speech Pathology

## 2023-03-20 DIAGNOSIS — M79604 Pain in right leg: Secondary | ICD-10-CM | POA: Diagnosis not present

## 2023-03-20 DIAGNOSIS — R262 Difficulty in walking, not elsewhere classified: Secondary | ICD-10-CM | POA: Diagnosis not present

## 2023-03-20 DIAGNOSIS — M549 Dorsalgia, unspecified: Secondary | ICD-10-CM | POA: Diagnosis not present

## 2023-03-20 DIAGNOSIS — M546 Pain in thoracic spine: Secondary | ICD-10-CM | POA: Diagnosis not present

## 2023-03-20 DIAGNOSIS — Q766 Other congenital malformations of ribs: Secondary | ICD-10-CM | POA: Diagnosis not present

## 2023-03-20 DIAGNOSIS — R41841 Cognitive communication deficit: Secondary | ICD-10-CM

## 2023-03-20 DIAGNOSIS — R2689 Other abnormalities of gait and mobility: Secondary | ICD-10-CM | POA: Diagnosis not present

## 2023-03-20 DIAGNOSIS — M79605 Pain in left leg: Secondary | ICD-10-CM | POA: Diagnosis not present

## 2023-03-20 DIAGNOSIS — M6281 Muscle weakness (generalized): Secondary | ICD-10-CM | POA: Diagnosis not present
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
## 2023-03-23 ENCOUNTER — Ambulatory Visit: Payer: 59 | Admitting: Speech Pathology

## 2023-03-23 ENCOUNTER — Ambulatory Visit: Payer: 59 | Attending: Physician Assistant

## 2023-03-23 DIAGNOSIS — R262 Difficulty in walking, not elsewhere classified: Secondary | ICD-10-CM | POA: Diagnosis not present

## 2023-03-23 DIAGNOSIS — M546 Pain in thoracic spine: Secondary | ICD-10-CM | POA: Diagnosis not present

## 2023-03-23 DIAGNOSIS — M549 Dorsalgia, unspecified: Secondary | ICD-10-CM

## 2023-03-23 DIAGNOSIS — M79604 Pain in right leg: Secondary | ICD-10-CM

## 2023-03-23 DIAGNOSIS — M79605 Pain in left leg: Secondary | ICD-10-CM

## 2023-03-23 DIAGNOSIS — M6281 Muscle weakness (generalized): Secondary | ICD-10-CM | POA: Diagnosis not present

## 2023-03-23 DIAGNOSIS — R2689 Other abnormalities of gait and mobility: Secondary | ICD-10-CM | POA: Diagnosis not present
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
## 2023-03-25 ENCOUNTER — Encounter: Payer: 59 | Admitting: Speech Pathology

## 2023-03-25 ENCOUNTER — Ambulatory Visit: Payer: 59

## 2023-03-25 DIAGNOSIS — M546 Pain in thoracic spine: Secondary | ICD-10-CM

## 2023-03-25 DIAGNOSIS — M549 Dorsalgia, unspecified: Secondary | ICD-10-CM | POA: Diagnosis not present

## 2023-03-25 DIAGNOSIS — M6281 Muscle weakness (generalized): Secondary | ICD-10-CM

## 2023-03-25 DIAGNOSIS — M79605 Pain in left leg: Secondary | ICD-10-CM | POA: Diagnosis not present

## 2023-03-25 DIAGNOSIS — M79604 Pain in right leg: Secondary | ICD-10-CM

## 2023-03-25 DIAGNOSIS — R2689 Other abnormalities of gait and mobility: Secondary | ICD-10-CM | POA: Diagnosis not present

## 2023-03-25 DIAGNOSIS — R262 Difficulty in walking, not elsewhere classified: Secondary | ICD-10-CM | POA: Diagnosis not present
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
## 2023-03-27 ENCOUNTER — Encounter: Payer: Self-pay | Admitting: Physician Assistant

## 2023-03-27 ENCOUNTER — Ambulatory Visit
Admission: RE | Admit: 2023-03-27 | Discharge: 2023-03-27 | Disposition: A | Discharge status: Home / Self Care | Payer: 59 | Source: Ambulatory Visit | Attending: Physician Assistant | Admitting: Physician Assistant

## 2023-03-27 ENCOUNTER — Ambulatory Visit (INDEPENDENT_AMBULATORY_CARE_PROVIDER_SITE_OTHER): Payer: 59 | Admitting: Physician Assistant

## 2023-03-27 ENCOUNTER — Ambulatory Visit
Admission: RE | Admit: 2023-03-27 | Discharge: 2023-03-27 | Disposition: A | Discharge status: Home / Self Care | Payer: 59 | Attending: Physician Assistant | Admitting: Physician Assistant

## 2023-03-27 VITALS — BP 138/82 | HR 98 | Temp 98.1°F | Resp 16 | Ht 66.0 in | Wt 152.6 lb

## 2023-03-27 DIAGNOSIS — S2241XD Multiple fractures of ribs, right side, subsequent encounter for fracture with routine healing: Secondary | ICD-10-CM | POA: Diagnosis not present

## 2023-03-27 DIAGNOSIS — E1165 Type 2 diabetes mellitus with hyperglycemia: Secondary | ICD-10-CM | POA: Diagnosis not present

## 2023-03-27 DIAGNOSIS — I10 Essential (primary) hypertension: Secondary | ICD-10-CM

## 2023-03-27 DIAGNOSIS — Z0389 Encounter for observation for other suspected diseases and conditions ruled out: Secondary | ICD-10-CM | POA: Diagnosis not present

## 2023-03-27 LAB — POCT GLYCOSYLATED HEMOGLOBIN (HGB A1C): Hemoglobin A1C: 7.2 % — AB (ref 4.0–5.6)

## 2023-03-27 MED ORDER — ACCU-CHEK GUIDE VI STRP: ORAL_STRIP | 3 refills | Status: DC
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
## 2023-03-30 ENCOUNTER — Telehealth: Payer: Self-pay

## 2023-03-30 ENCOUNTER — Other Ambulatory Visit: Payer: Self-pay

## 2023-03-30 ENCOUNTER — Encounter: Payer: 59 | Admitting: Speech Pathology

## 2023-03-30 ENCOUNTER — Ambulatory Visit: Payer: 59

## 2023-03-30 NOTE — Telephone Encounter (Signed)
Pt advised for xray result and copy of result at front desk

## 2023-03-30 NOTE — Telephone Encounter (Signed)
Chest xray shows old rib fractures. Patient may have copy of results/work note to return to work

## 2023-04-01 ENCOUNTER — Encounter: Payer: 59 | Admitting: Speech Pathology

## 2023-04-01 ENCOUNTER — Ambulatory Visit: Payer: 59

## 2023-04-06 ENCOUNTER — Ambulatory Visit: Payer: 59

## 2023-04-06 ENCOUNTER — Encounter: Payer: 59 | Admitting: Speech Pathology

## 2023-04-08 ENCOUNTER — Ambulatory Visit: Payer: 59

## 2023-04-08 ENCOUNTER — Encounter: Payer: 59 | Admitting: Speech Pathology

## 2023-04-10 ENCOUNTER — Other Ambulatory Visit: Payer: Self-pay | Admitting: Physician Assistant

## 2023-04-13 ENCOUNTER — Ambulatory Visit: Payer: 59

## 2023-04-13 ENCOUNTER — Encounter: Payer: 59 | Admitting: Speech Pathology

## 2023-04-15 ENCOUNTER — Encounter: Payer: 59 | Admitting: Speech Pathology

## 2023-04-15 ENCOUNTER — Ambulatory Visit: Payer: 59

## 2023-04-16 DIAGNOSIS — E113393 Type 2 diabetes mellitus with moderate nonproliferative diabetic retinopathy without macular edema, bilateral: Secondary | ICD-10-CM | POA: Diagnosis not present

## 2023-04-17 ENCOUNTER — Telehealth: Payer: Self-pay

## 2023-04-17 NOTE — Telephone Encounter (Signed)
Completed P.A. for patient's Glipizide-Metformin.

## 2023-04-17 NOTE — Telephone Encounter (Signed)
Waiting for response from CoverMyMeds.

## 2023-04-19 ENCOUNTER — Telehealth: Payer: Self-pay | Admitting: Physician Assistant
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
## 2023-04-20 ENCOUNTER — Encounter: Payer: 59 | Admitting: Speech Pathology

## 2023-04-20 ENCOUNTER — Ambulatory Visit: Payer: 59

## 2023-04-22 ENCOUNTER — Encounter: Payer: 59 | Admitting: Speech Pathology

## 2023-04-22 ENCOUNTER — Ambulatory Visit: Payer: 59

## 2023-04-23 ENCOUNTER — Telehealth: Payer: Self-pay | Admitting: Physician Assistant

## 2023-04-23 NOTE — Telephone Encounter (Addendum)
Received request from [facility] office for 03/27/23 office note and billing statement. Faxed to [phone]

## 2023-04-23 NOTE — Telephone Encounter (Signed)
error 

## 2023-04-27 ENCOUNTER — Encounter: Payer: 59 | Admitting: Speech Pathology

## 2023-04-27 ENCOUNTER — Ambulatory Visit: Payer: 59

## 2023-04-29 ENCOUNTER — Encounter: Payer: 59 | Admitting: Speech Pathology

## 2023-04-29 ENCOUNTER — Ambulatory Visit: Payer: 59

## 2023-05-04 ENCOUNTER — Encounter: Payer: 59 | Admitting: Speech Pathology

## 2023-05-04 ENCOUNTER — Ambulatory Visit: Payer: 59

## 2023-05-05 ENCOUNTER — Telehealth: Payer: Self-pay | Admitting: Physician Assistant

## 2023-05-05 NOTE — Telephone Encounter (Signed)
s/w patient to schedule diabetic eye exam here at office. She stated she has done at regular eye doctor-Toni

## 2023-05-06 ENCOUNTER — Ambulatory Visit: Payer: 59

## 2023-05-06 ENCOUNTER — Encounter: Payer: 59 | Admitting: Speech Pathology

## 2023-05-08 ENCOUNTER — Other Ambulatory Visit: Payer: Self-pay | Admitting: Physician Assistant

## 2023-05-08 NOTE — Telephone Encounter (Signed)
Please ccheck

## 2023-05-11 ENCOUNTER — Ambulatory Visit: Payer: 59

## 2023-05-11 ENCOUNTER — Encounter: Payer: 59 | Admitting: Speech Pathology

## 2023-05-13 ENCOUNTER — Encounter: Payer: 59 | Admitting: Speech Pathology

## 2023-05-18 ENCOUNTER — Telehealth: Payer: Self-pay | Admitting: Physician Assistant

## 2023-05-18 NOTE — Telephone Encounter (Signed)
Received call on after-hour line requesting refill on medication. I explained to patient that office is closed for holiday and will not be able to refill until Tuesday 05/19/23 when office opens. Sent message to cmas-Toni

## 2023-05-19 ENCOUNTER — Other Ambulatory Visit: Payer: Self-pay

## 2023-05-19 NOTE — Telephone Encounter (Signed)
Patient approved for Glyburide-Metformin, notified patient that she can now pick it up from the pharmacy.

## 2023-05-20 ENCOUNTER — Encounter: Payer: 59 | Admitting: Speech Pathology

## 2023-05-20 ENCOUNTER — Ambulatory Visit: Payer: 59

## 2023-05-25 ENCOUNTER — Encounter: Payer: 59 | Admitting: Speech Pathology

## 2023-05-25 ENCOUNTER — Ambulatory Visit: Payer: 59

## 2023-05-27 ENCOUNTER — Ambulatory Visit: Payer: 59

## 2023-05-27 ENCOUNTER — Encounter: Payer: 59 | Admitting: Speech Pathology

## 2023-06-01 ENCOUNTER — Ambulatory Visit: Payer: 59

## 2023-06-01 ENCOUNTER — Encounter: Payer: 59 | Admitting: Speech Pathology

## 2023-06-03 ENCOUNTER — Ambulatory Visit: Payer: 59

## 2023-06-03 ENCOUNTER — Encounter: Payer: 59 | Admitting: Speech Pathology

## 2023-06-08 ENCOUNTER — Ambulatory Visit: Payer: 59

## 2023-06-08 ENCOUNTER — Encounter: Payer: 59 | Admitting: Speech Pathology

## 2023-06-10 ENCOUNTER — Encounter: Payer: 59 | Admitting: Speech Pathology

## 2023-06-10 ENCOUNTER — Ambulatory Visit: Payer: 59

## 2023-06-15 ENCOUNTER — Encounter: Payer: 59 | Admitting: Speech Pathology

## 2023-06-15 ENCOUNTER — Ambulatory Visit: Payer: 59

## 2023-06-17 ENCOUNTER — Ambulatory Visit: Payer: 59

## 2023-06-17 ENCOUNTER — Encounter: Payer: 59 | Admitting: Speech Pathology

## 2023-06-20 ENCOUNTER — Other Ambulatory Visit: Payer: Self-pay | Admitting: Physician Assistant

## 2023-06-20 DIAGNOSIS — E1165 Type 2 diabetes mellitus with hyperglycemia: Secondary | ICD-10-CM

## 2023-06-22 ENCOUNTER — Encounter: Payer: 59 | Admitting: Speech Pathology

## 2023-06-22 ENCOUNTER — Ambulatory Visit: Payer: 59

## 2023-06-24 ENCOUNTER — Ambulatory Visit: Payer: 59

## 2023-06-24 ENCOUNTER — Encounter: Payer: 59 | Admitting: Speech Pathology

## 2023-06-24 ENCOUNTER — Other Ambulatory Visit: Payer: Self-pay | Admitting: Physician Assistant

## 2023-06-24 DIAGNOSIS — Z1231 Encounter for screening mammogram for malignant neoplasm of breast: Secondary | ICD-10-CM

## 2023-06-29 ENCOUNTER — Ambulatory Visit: Payer: 59

## 2023-06-29 ENCOUNTER — Encounter: Payer: 59 | Admitting: Speech Pathology

## 2023-07-01 ENCOUNTER — Ambulatory Visit: Payer: 59

## 2023-07-01 ENCOUNTER — Encounter: Payer: 59 | Admitting: Speech Pathology

## 2023-07-06 ENCOUNTER — Encounter: Payer: 59 | Admitting: Speech Pathology

## 2023-07-06 ENCOUNTER — Ambulatory Visit: Payer: 59

## 2023-07-07 ENCOUNTER — Other Ambulatory Visit: Payer: Self-pay

## 2023-07-07 DIAGNOSIS — E1165 Type 2 diabetes mellitus with hyperglycemia: Secondary | ICD-10-CM

## 2023-07-07 MED ORDER — ACCU-CHEK GUIDE VI STRP
ORAL_STRIP | 3 refills | Status: DC
Start: 2023-07-07 — End: 2024-07-11

## 2023-07-08 ENCOUNTER — Ambulatory Visit: Payer: 59

## 2023-07-08 ENCOUNTER — Encounter: Payer: 59 | Admitting: Speech Pathology

## 2023-07-31 ENCOUNTER — Ambulatory Visit: Payer: 59 | Admitting: Physician Assistant

## 2023-07-31 ENCOUNTER — Encounter: Payer: Self-pay | Admitting: Physician Assistant

## 2023-07-31 ENCOUNTER — Ambulatory Visit
Admission: RE | Admit: 2023-07-31 | Discharge: 2023-07-31 | Disposition: A | Discharge status: Home / Self Care | Payer: 59 | Source: Ambulatory Visit | Attending: Physician Assistant | Admitting: Physician Assistant

## 2023-07-31 ENCOUNTER — Ambulatory Visit (INDEPENDENT_AMBULATORY_CARE_PROVIDER_SITE_OTHER): Payer: 59 | Admitting: Physician Assistant

## 2023-07-31 VITALS — BP 127/71 | HR 76 | Temp 97.8°F | Resp 16 | Ht 66.0 in | Wt 155.0 lb

## 2023-07-31 DIAGNOSIS — E559 Vitamin D deficiency, unspecified: Secondary | ICD-10-CM

## 2023-07-31 DIAGNOSIS — M2011 Hallux valgus (acquired), right foot: Secondary | ICD-10-CM

## 2023-07-31 DIAGNOSIS — E538 Deficiency of other specified B group vitamins: Secondary | ICD-10-CM

## 2023-07-31 DIAGNOSIS — R946 Abnormal results of thyroid function studies: Secondary | ICD-10-CM

## 2023-07-31 DIAGNOSIS — E782 Mixed hyperlipidemia: Secondary | ICD-10-CM | POA: Diagnosis not present

## 2023-07-31 DIAGNOSIS — I10 Essential (primary) hypertension: Secondary | ICD-10-CM | POA: Diagnosis not present

## 2023-07-31 DIAGNOSIS — Z1231 Encounter for screening mammogram for malignant neoplasm of breast: Secondary | ICD-10-CM | POA: Insufficient documentation

## 2023-07-31 DIAGNOSIS — R5383 Other fatigue: Secondary | ICD-10-CM

## 2023-07-31 DIAGNOSIS — E1165 Type 2 diabetes mellitus with hyperglycemia: Secondary | ICD-10-CM

## 2023-07-31 LAB — POCT GLYCOSYLATED HEMOGLOBIN (HGB A1C): Hemoglobin A1C: 7.1 % — AB (ref 4.0–5.6)

## 2023-07-31 MED ORDER — SITAGLIPTIN PHOSPHATE 100 MG PO TABS: 100.0000 mg | ORAL_TABLET | Freq: Every day | ORAL | 1 refills | Status: DC

## 2023-07-31 NOTE — Progress Notes (Signed)
[facility] [address] [place], [place] [address]  Internal MEDICINE  Office Visit Note  Patient Name: Regina Short  [id number]  [id number]  Date of Service: 08/11/2023  Chief Complaint  Patient presents with   Follow-up   Diabetes    HPI Pt is here for routine follow up and is doing well -Does have hallax valgus, right worse than left. Would like podiatry referral -Alma Friendly has been out for the past 2 weeks, will give sample and resend script. May need PA -Will have eye exam in October -due for labs prior to CPE -mammogram results not available yet  Current Medication: Outpatient Encounter Medications as of 07/31/2023  Medication Sig   Accu-Chek FastClix Lancets MISC Use as directed to check blood sugars twice a day  E11.65   acetaminophen (TYLENOL) 500 MG tablet Take 2 tablets (1,000 mg total) by mouth every 6 (six) hours as needed.   benazepril (LOTENSIN) 10 MG tablet TAKE 1 TABLET BY MOUTH EVERY DAY   Cholecalciferol 125 MCG (5000 UT) capsule Take 5,000 Units by mouth daily.   docusate sodium (COLACE) 100 MG capsule Take 1 capsule (100 mg total) by mouth 2 (two) times daily.   glucose blood (ACCU-CHEK GUIDE) test strip USE TO TEST BLOOD SUGARS TWICE A DAY AS NEEDED E11.65   glyBURIDE-metformin (GLUCOVANCE) 5-500 MG tablet TAKE 1.5 TABLET BY MOUTH TWICE A DAY OR IF BLOOD SUGAR DROPS TAKE 1 TABLET TWICE A DAY   lidocaine (LIDODERM) 5 % Place 1 patch onto the skin every 12 (twelve) hours. Remove & Discard patch within 12 hours or as directed by MD   methocarbamol (ROBAXIN) 500 MG tablet Take 1 tablet (500 mg total) by mouth every 8 (eight) hours as needed for muscle spasms.   polyethylene glycol powder (GLYCOLAX/MIRALAX) 17 GM/SCOOP powder Take 17 g by mouth daily.   pravastatin (PRAVACHOL) 10 MG tablet TAKE 1 TABLET BY MOUTH EVERY DAY   traMADol (ULTRAM) 50 MG tablet Take 1 tablet (50 mg total) by mouth daily as needed.   [DISCONTINUED] JANUVIA 100 MG tablet TAKE  1 TABLET BY MOUTH EVERY DAY   sitaGLIPtin (JANUVIA) 100 MG tablet Take 1 tablet (100 mg total) by mouth daily.   No facility-administered encounter medications on file as of 07/31/2023.    Surgical History: Past Surgical History:  Procedure Laterality Date   ABDOMINAL HYSTERECTOMY     ABDOMINAL SURGERY     5 tumors removed   BREAST BIOPSY Right 02/02/15 core   focal fibroadenomatoid changes and sclerosis    Medical History: Past Medical History:  Diagnosis Date   Chronic back pain    Diabetes mellitus without complication (HCC)    GERD (gastroesophageal reflux disease)    Hand tendonitis    Hypertension     Family History: Family History  Problem Relation Age of Onset   Congestive Heart Failure Other    Breast cancer Maternal Aunt 19    Social History   Socioeconomic History   Marital status: Single    Spouse name: Not on file   Number of children: Not on file   Years of education: Not on file   Highest education level: Not on file  Occupational History   Not on file  Tobacco Use   Smoking status: Former    Current packs/day: 0.00    Types: Cigarettes    Quit date: 02/26/2021    Years since quitting: 2.4   Smokeless tobacco: Never   Tobacco comments:  Quit  Substance and Sexual Activity   Alcohol use: Yes    Alcohol/week: 0.0 standard drinks of alcohol    Comment: holidays   Drug use: No   Sexual activity: Yes    Birth control/protection: None  Other Topics Concern   Not on file  Social History Narrative   Not on file   Social Determinants of Health   Financial Resource Strain: Not on file  Food Insecurity: No Food Insecurity (01/17/2023)   Hunger Vital Sign    Worried About Running Out of Food in the Last Year: Never true    Ran Out of Food in the Last Year: Never true  Transportation Needs: No Transportation Needs (01/17/2023)   PRAPARE - Administrator, Civil Service (Medical): No    Lack of Transportation (Non-Medical): No   Physical Activity: Not on file  Stress: Not on file  Social Connections: Not on file  Intimate Partner Violence: Not At Risk (01/17/2023)   Humiliation, Afraid, Rape, and Kick questionnaire    Fear of Current or Ex-Partner: No    Emotionally Abused: No    Physically Abused: No    Sexually Abused: No      Review of Systems  Constitutional:  Negative for chills, fatigue and unexpected weight change.  HENT:  Negative for congestion, rhinorrhea, sneezing and sore throat.   Eyes:  Positive for visual disturbance. Negative for redness.  Respiratory:  Negative for cough, chest tightness and shortness of breath.   Cardiovascular:  Negative for chest pain and palpitations.  Gastrointestinal:  Negative for abdominal pain, constipation, diarrhea, nausea and vomiting.  Genitourinary:  Negative for dysuria and frequency.  Musculoskeletal:  Positive for arthralgias. Negative for back pain, joint swelling and neck pain.  Skin:  Negative for rash.  Neurological: Negative.  Negative for tremors and numbness.  Hematological:  Negative for adenopathy. Does not bruise/bleed easily.  Psychiatric/Behavioral:  Negative for behavioral problems (Depression), sleep disturbance and suicidal ideas. The patient is not nervous/anxious.     Vital Signs: BP 127/71   Pulse 76   Temp 97.8 F (36.6 C)   Resp 16   Ht 5\' 6"  (1.676 m)   Wt 155 lb (70.3 kg)   SpO2 96%   BMI 25.02 kg/m    Physical Exam Vitals and nursing note reviewed.  Constitutional:      General: She is not in acute distress.    Appearance: She is well-developed and normal weight. She is not diaphoretic.  HENT:     Head: Normocephalic and atraumatic.     Mouth/Throat:     Pharynx: No oropharyngeal exudate.  Neck:     Thyroid: No thyromegaly.     Vascular: No JVD.     Trachea: No tracheal deviation.  Cardiovascular:     Rate and Rhythm: Normal rate and regular rhythm.     Heart sounds: Normal heart sounds. No murmur heard.    No  friction rub. No gallop.  Pulmonary:     Effort: Pulmonary effort is normal. No respiratory distress.     Breath sounds: No wheezing or rales.  Chest:     Chest wall: No tenderness.  Abdominal:     General: Bowel sounds are normal.     Palpations: Abdomen is soft.  Musculoskeletal:        General: No swelling or tenderness. Normal range of motion.     Cervical back: Normal range of motion and neck supple.     Right lower leg:  No edema.     Left lower leg: No edema.  Feet:     Comments: Hallux valgus on right that is bothersome and red from rubbing against shoes Lymphadenopathy:     Cervical: No cervical adenopathy.  Skin:    General: Skin is warm and dry.  Neurological:     Mental Status: She is alert and oriented to person, place, and time.     Cranial Nerves: No cranial nerve deficit.  Psychiatric:        Behavior: Behavior normal.        Thought Content: Thought content normal.        Judgment: Judgment normal.        Assessment/Plan: 1. Uncontrolled type 2 diabetes mellitus with hyperglycemia (HCC) - POCT HgB A1C is 7.1 which is improved from 7.2 last visit. Continue to work on diet and exercise and continue current medications - sitaGLIPtin (JANUVIA) 100 MG tablet; Take 1 tablet (100 mg total) by mouth daily.  Dispense: 90 tablet; Refill: 1 - Ambulatory referral to Podiatry  2. Essential hypertension Stable, continue current medication  3. Hallux valgus (acquired), right foot - Ambulatory referral to Podiatry  4. Vitamin D deficiency - VITAMIN D 25 Hydroxy (Vit-D Deficiency, Fractures)  5. B12 deficiency - B12 and Folate Panel  6. Abnormal thyroid exam - TSH + free T4  7. Mixed hyperlipidemia - Lipid Panel With LDL/HDL Ratio  8. Other fatigue - CBC w/Diff/Platelet - Comprehensive metabolic panel - TSH + free T4 - Lipid Panel With LDL/HDL Ratio - VITAMIN D 25 Hydroxy (Vit-D Deficiency, Fractures) - B12 and Folate Panel   General Counseling: Djeneba  verbalizes understanding of the findings of todays visit and agrees with plan of treatment. I have discussed any further diagnostic evaluation that may be needed or ordered today. We also reviewed her medications today. she has been encouraged to call the office with any questions or concerns that should arise related to todays visit.    Orders Placed This Encounter  Procedures   CBC w/Diff/Platelet   Comprehensive metabolic panel   TSH + free T4   Lipid Panel With LDL/HDL Ratio   VITAMIN D 25 Hydroxy (Vit-D Deficiency, Fractures)   B12 and Folate Panel   Ambulatory referral to Podiatry   POCT HgB A1C    Meds ordered this encounter  Medications   sitaGLIPtin (JANUVIA) 100 MG tablet    Sig: Take 1 tablet (100 mg total) by mouth daily.    Dispense:  90 tablet    Refill:  1    This patient was seen by Lynn Ito, PA-C in collaboration with Dr. Beverely Risen as a part of collaborative care agreement.   Total time spent:30 Minutes Time spent includes review of chart, medications, test results, and follow up plan with the patient.      Dr Lyndon Code Internal medicine

## 2023-08-03 ENCOUNTER — Other Ambulatory Visit: Payer: Self-pay | Admitting: Physician Assistant

## 2023-08-03 DIAGNOSIS — N6489 Other specified disorders of breast: Secondary | ICD-10-CM

## 2023-08-03 DIAGNOSIS — R928 Other abnormal and inconclusive findings on diagnostic imaging of breast: Secondary | ICD-10-CM

## 2023-08-10 ENCOUNTER — Ambulatory Visit
Admission: RE | Admit: 2023-08-10 | Discharge: 2023-08-10 | Disposition: A | Discharge status: Home / Self Care | Payer: 59 | Source: Ambulatory Visit | Attending: Physician Assistant | Admitting: Physician Assistant

## 2023-08-10 DIAGNOSIS — N6489 Other specified disorders of breast: Secondary | ICD-10-CM | POA: Diagnosis not present

## 2023-08-10 DIAGNOSIS — R928 Other abnormal and inconclusive findings on diagnostic imaging of breast: Secondary | ICD-10-CM

## 2023-08-10 DIAGNOSIS — R92333 Mammographic heterogeneous density, bilateral breasts: Secondary | ICD-10-CM | POA: Diagnosis not present

## 2023-08-14 ENCOUNTER — Ambulatory Visit (INDEPENDENT_AMBULATORY_CARE_PROVIDER_SITE_OTHER): Payer: 59

## 2023-08-14 ENCOUNTER — Encounter: Payer: Self-pay | Admitting: Podiatry

## 2023-08-14 ENCOUNTER — Ambulatory Visit: Payer: 59 | Admitting: Podiatry

## 2023-08-14 DIAGNOSIS — M2011 Hallux valgus (acquired), right foot: Secondary | ICD-10-CM

## 2023-08-14 DIAGNOSIS — M7751 Other enthesopathy of right foot: Secondary | ICD-10-CM

## 2023-08-14 MED ORDER — BETAMETHASONE SOD PHOS & ACET 6 (3-3) MG/ML IJ SUSP
3.0000 mg | Freq: Once | INTRAMUSCULAR | Status: AC
Start: 2023-08-14 — End: 2023-08-14
  Administered 2023-08-14: 3 mg via INTRA_ARTICULAR

## 2023-08-14 MED ORDER — MELOXICAM 15 MG PO TABS: 15.0000 mg | ORAL_TABLET | Freq: Every day | ORAL | 1 refills | Status: AC

## 2023-08-14 NOTE — Progress Notes (Signed)
Chief Complaint  Patient presents with   Diabetes    "I'm Diabetic.  My feet burn under the bottom of both feet.  I have a bunion on my right foot that bothers me." N - bunion L - right D - January 2024 O - suddenly, gotten worse C - sore, sharp pain, red A - walking, shoes pressing it T - none  N - burning L - plantar bilateral D - 3-4 years O - gradually worse C - burning A - standing/ walking at work 12 hrs. T - PCP did that needle test on my feet    Subjective: 62 y.o. female PMHx T2DM; A1c 7.1 on 07/31/2023 presents today as a new patient for evaluation of a symptomatic painful bunion to the right great toe joint.  This is been present for several years.  Over the last 3-4 years the pain has increased and exacerbated.  It is painful now on a daily basis.  She works on her feet all day long.  She has tried different shoes with minimal relief.  Past Medical History:  Diagnosis Date   Chronic back pain    Diabetes mellitus without complication (HCC)    GERD (gastroesophageal reflux disease)    Hand tendonitis    Hypertension     Past Surgical History:  Procedure Laterality Date   ABDOMINAL HYSTERECTOMY     ABDOMINAL SURGERY     5 tumors removed   BREAST BIOPSY Right 02/02/15 core   focal fibroadenomatoid changes and sclerosis    Allergies  Allergen Reactions   Micardis [Telmisartan]     rash   Ciprofloxacin Rash   Tramadol Rash     Objective: Physical Exam General: The patient is alert and oriented x3 in no acute distress.  Dermatology: Skin is cool, dry and supple bilateral lower extremities. Negative for open lesions or macerations.  Vascular: Palpable pedal pulses bilaterally. No edema or erythema noted. Capillary refill within normal limits.  Neurological: Grossly intact via light touch  Musculoskeletal Exam: Clinical evidence of bunion deformity noted to the respective foot. There is moderate pain on palpation range of motion of the first MPJ.  Lateral deviation of the hallux noted consistent with hallux abductovalgus.  Radiographic Exam RT foot 08/14/2023: Normal osseous mineralization.  No acute fractures identified.  Increased intermetatarsal angle greater than 15 with a hallux abductus angle greater than 30 noted on AP view.   Assessment: 1.  Hallux valgus right   Plan of Care:  -Patient was evaluated. X-Rays reviewed. -Today we discussed the pathology and etiology of bunion deformity.  We discussed different conservative treatment options.  The patient would like to consider having surgery towards the end of the year/beginning of next year -In the meantime injection of 0.5 cc Celestone Soluspan injected into the first MTP of the right foot -Prescription for meloxicam 15 mg daily -Recommend wide fitting shoes that do not irritate or constrict the toebox area -Return to clinic December for follow-up and possible surgical consent.  Patient states that in December/January she will have her daughter to help her postoperatively.  She understands that she will be about 2 months out of work.      Felecia Shelling, DPM [facility]  Dr. Felecia Shelling, DPM    [address]  [place], [place] [address]                Office [phone]  Fax [phone]

## 2023-10-05 ENCOUNTER — Other Ambulatory Visit: Payer: Self-pay | Admitting: Physician Assistant

## 2023-10-15 DIAGNOSIS — Z008 Encounter for other general examination: Secondary | ICD-10-CM | POA: Diagnosis not present

## 2023-10-20 DIAGNOSIS — E113393 Type 2 diabetes mellitus with moderate nonproliferative diabetic retinopathy without macular edema, bilateral: Secondary | ICD-10-CM | POA: Diagnosis not present

## 2023-10-30 DIAGNOSIS — R946 Abnormal results of thyroid function studies: Secondary | ICD-10-CM | POA: Diagnosis not present

## 2023-10-30 DIAGNOSIS — E559 Vitamin D deficiency, unspecified: Secondary | ICD-10-CM | POA: Diagnosis not present

## 2023-10-30 DIAGNOSIS — R5383 Other fatigue: Secondary | ICD-10-CM | POA: Diagnosis not present

## 2023-10-30 DIAGNOSIS — E782 Mixed hyperlipidemia: Secondary | ICD-10-CM | POA: Diagnosis not present

## 2023-10-30 DIAGNOSIS — E538 Deficiency of other specified B group vitamins: Secondary | ICD-10-CM | POA: Diagnosis not present

## 2023-10-31 LAB — COMPREHENSIVE METABOLIC PANEL
ALT: 14 [IU]/L (ref 0–32)
AST: 18 [IU]/L (ref 0–40)
Albumin: 5 g/dL — ABNORMAL HIGH (ref 3.9–4.9)
Alkaline Phosphatase: 109 [IU]/L (ref 44–121)
BUN/Creatinine Ratio: 32 — ABNORMAL HIGH (ref 12–28)
BUN: 21 mg/dL (ref 8–27)
Bilirubin Total: 0.4 mg/dL (ref 0.0–1.2)
CO2: 22 mmol/L (ref 20–29)
Calcium: 10.4 mg/dL — ABNORMAL HIGH (ref 8.7–10.3)
Chloride: 100 mmol/L (ref 96–106)
Creatinine, Ser: 0.65 mg/dL (ref 0.57–1.00)
Globulin, Total: 2.3 g/dL (ref 1.5–4.5)
Glucose: 173 mg/dL — ABNORMAL HIGH (ref 70–99)
Potassium: 5.7 mmol/L — ABNORMAL HIGH (ref 3.5–5.2)
Sodium: 139 mmol/L (ref 134–144)
Total Protein: 7.3 g/dL (ref 6.0–8.5)
eGFR: 99 mL/min/{1.73_m2} (ref >59)

## 2023-10-31 LAB — CBC WITH DIFFERENTIAL/PLATELET
Basophils Absolute: 0 10*3/uL (ref 0.0–0.2)
Basos: 1 %
EOS (ABSOLUTE): 0.1 10*3/uL (ref 0.0–0.4)
Eos: 2 %
Hematocrit: 41.7 % (ref 34.0–46.6)
Hemoglobin: 13.1 g/dL (ref 11.1–15.9)
Immature Grans (Abs): 0 10*3/uL (ref 0.0–0.1)
Immature Granulocytes: 0 %
Lymphocytes Absolute: 1.8 10*3/uL (ref 0.7–3.1)
Lymphs: 43 %
MCH: 26.4 pg — ABNORMAL LOW (ref 26.6–33.0)
MCHC: 31.4 g/dL — ABNORMAL LOW (ref 31.5–35.7)
MCV: 84 fL (ref 79–97)
Monocytes Absolute: 0.3 10*3/uL (ref 0.1–0.9)
Monocytes: 7 %
Neutrophils Absolute: 2 10*3/uL (ref 1.4–7.0)
Neutrophils: 47 %
Platelets: 313 10*3/uL (ref 150–450)
RBC: 4.97 x10E6/uL (ref 3.77–5.28)
RDW: 15.7 % — ABNORMAL HIGH (ref 11.7–15.4)
WBC: 4.1 10*3/uL (ref 3.4–10.8)

## 2023-10-31 LAB — VITAMIN D 25 HYDROXY (VIT D DEFICIENCY, FRACTURES): Vit D, 25-Hydroxy: 60.7 ng/mL (ref 30.0–100.0)

## 2023-10-31 LAB — TSH+FREE T4
Free T4: 1.32 ng/dL (ref 0.82–1.77)
TSH: 0.48 u[IU]/mL (ref 0.450–4.500)

## 2023-10-31 LAB — LIPID PANEL WITH LDL/HDL RATIO
Cholesterol, Total: 225 mg/dL — ABNORMAL HIGH (ref 100–199)
HDL: 106 mg/dL (ref >39)
LDL Chol Calc (NIH): 98 mg/dL (ref 0–99)
LDL/HDL Ratio: 0.9 {ratio} (ref 0.0–3.2)
Triglycerides: 125 mg/dL (ref 0–149)
VLDL Cholesterol Cal: 21 mg/dL (ref 5–40)

## 2023-10-31 LAB — B12 AND FOLATE PANEL
Folate: 13.8 ng/mL (ref >3.0)
Vitamin B-12: 813 pg/mL (ref 232–1245)

## 2023-11-03 ENCOUNTER — Telehealth: Payer: Self-pay

## 2023-11-03 ENCOUNTER — Other Ambulatory Visit: Payer: Self-pay | Admitting: Physician Assistant

## 2023-11-03 DIAGNOSIS — E1165 Type 2 diabetes mellitus with hyperglycemia: Secondary | ICD-10-CM

## 2023-11-03 DIAGNOSIS — E875 Hyperkalemia: Secondary | ICD-10-CM

## 2023-11-03 NOTE — Telephone Encounter (Signed)
Patient will call back to let us know if she has been taking potassium and calcium. She needs to decrease the amount she is taking.

## 2023-11-03 NOTE — Telephone Encounter (Signed)
-----   Message from Carlean Jews sent at 11/03/2023  1:20 PM EST ----- Please let patient know that her potassium and calcium are elevated and ask if she has been taking any supplements of these? If so need to decrease. Will need to repeat labs in 1 week once stopped. Will also add A1c to lab order--placed

## 2023-11-10 DIAGNOSIS — E1165 Type 2 diabetes mellitus with hyperglycemia: Secondary | ICD-10-CM | POA: Diagnosis not present

## 2023-11-10 DIAGNOSIS — E875 Hyperkalemia: Secondary | ICD-10-CM | POA: Diagnosis not present

## 2023-11-11 LAB — BASIC METABOLIC PANEL
BUN/Creatinine Ratio: 33 — ABNORMAL HIGH (ref 12–28)
BUN: 23 mg/dL (ref 8–27)
CO2: 26 mmol/L (ref 20–29)
Calcium: 10 mg/dL (ref 8.7–10.3)
Chloride: 102 mmol/L (ref 96–106)
Creatinine, Ser: 0.7 mg/dL (ref 0.57–1.00)
Glucose: 194 mg/dL — ABNORMAL HIGH (ref 70–99)
Potassium: 5.7 mmol/L — ABNORMAL HIGH (ref 3.5–5.2)
Sodium: 141 mmol/L (ref 134–144)
eGFR: 98 mL/min/{1.73_m2} (ref >59)

## 2023-11-11 LAB — HGB A1C W/O EAG: Hgb A1c MFr Bld: 6.5 % — ABNORMAL HIGH (ref 4.8–5.6)

## 2023-11-12 ENCOUNTER — Telehealth: Payer: Self-pay

## 2023-11-12 NOTE — Telephone Encounter (Signed)
Spoke with patient regarding labs. Per Leotis Shames, she will now take a 1/2 tab of Benazepril for 1 week and re-check labs to see if potassium levels have decreased.

## 2023-11-12 NOTE — Telephone Encounter (Signed)
-----   Message from Carlean Jews sent at 11/11/2023  2:25 PM EST ----- Did patient ever states whether she was taking potassium and if she stopped? Or if eating a lot foods with potassium like bananas? It is still elevated. If she already made changes then will need to try cutting back her benazepril as this can raise potassium--take 1/2 and monitor BP--if high call office and will adjust further. Please order potassium level to be done in 1 week again after changes.

## 2023-11-26 ENCOUNTER — Other Ambulatory Visit: Payer: Self-pay

## 2023-11-26 DIAGNOSIS — E1165 Type 2 diabetes mellitus with hyperglycemia: Secondary | ICD-10-CM

## 2023-11-26 DIAGNOSIS — E875 Hyperkalemia: Secondary | ICD-10-CM

## 2023-11-26 MED ORDER — SITAGLIPTIN PHOSPHATE 100 MG PO TABS
100.0000 mg | ORAL_TABLET | Freq: Every day | ORAL | 1 refills | Status: DC
Start: 2023-11-26 — End: 2024-04-22

## 2023-11-26 MED ORDER — BENAZEPRIL-HYDROCHLOROTHIAZIDE 5-6.25 MG PO TABS: 1.0000 | ORAL_TABLET | Freq: Every day | ORAL | 0 refills | Status: DC

## 2023-11-27 DIAGNOSIS — E875 Hyperkalemia: Secondary | ICD-10-CM | POA: Diagnosis not present

## 2023-11-28 LAB — BASIC METABOLIC PANEL
BUN/Creatinine Ratio: 28 (ref 12–28)
BUN: 16 mg/dL (ref 8–27)
CO2: 23 mmol/L (ref 20–29)
Calcium: 9.5 mg/dL (ref 8.7–10.3)
Chloride: 104 mmol/L (ref 96–106)
Creatinine, Ser: 0.58 mg/dL (ref 0.57–1.00)
Glucose: 170 mg/dL — ABNORMAL HIGH (ref 70–99)
Potassium: 4.6 mmol/L (ref 3.5–5.2)
Sodium: 139 mmol/L (ref 134–144)
eGFR: 102 mL/min/{1.73_m2} (ref >59)

## 2023-12-01 ENCOUNTER — Ambulatory Visit: Payer: 59 | Admitting: Podiatry

## 2023-12-01 ENCOUNTER — Encounter: Payer: Self-pay | Admitting: Podiatry

## 2023-12-01 DIAGNOSIS — M2011 Hallux valgus (acquired), right foot: Secondary | ICD-10-CM | POA: Diagnosis not present

## 2023-12-01 NOTE — Progress Notes (Signed)
   Chief Complaint  Patient presents with   Foot Pain    "It's still sore and red."    Subjective: 62 y.o. female PMHx T2DM; A1c 7.1 on 07/31/2023 presenting today for follow-up evaluation of bunion deformity to the right foot.  She continues to have pain and tenderness to the right foot bunion area.  She has been taking the meloxicam with minimal relief.  She also wears good shoes that are wide in the toebox area but she continues to have pain.  She presents today for surgical consult.  She says that she is ready to have surgery to correct for her symptomatic bunion which has been ongoing for several years.  Past Medical History:  Diagnosis Date   Chronic back pain    Diabetes mellitus without complication (HCC)    GERD (gastroesophageal reflux disease)    Hand tendonitis    Hypertension     Past Surgical History:  Procedure Laterality Date   ABDOMINAL HYSTERECTOMY     ABDOMINAL SURGERY     5 tumors removed   BREAST BIOPSY Right 02/02/15 core   focal fibroadenomatoid changes and sclerosis    Allergies  Allergen Reactions   Micardis [Telmisartan]     rash   Ciprofloxacin Rash   Tramadol Rash     Objective: Physical Exam General: The patient is alert and oriented x3 in no acute distress.  Dermatology: Skin is cool, dry and supple bilateral lower extremities. Negative for open lesions or macerations.  Vascular: Palpable pedal pulses bilaterally. No edema or erythema noted. Capillary refill within normal limits.  Neurological: Grossly intact via light touch  Musculoskeletal Exam: Clinical evidence of bunion deformity noted to the respective foot. There is moderate pain on palpation range of motion of the first MPJ. Lateral deviation of the hallux noted consistent with hallux abductovalgus.  Radiographic Exam RT foot 08/14/2023: Normal osseous mineralization.  No acute fractures identified.  Increased intermetatarsal angle greater than 15 with a hallux abductus angle greater  than 30 noted on AP view.   Assessment: 1.  Hallux valgus right   Plan of Care:  -Patient was evaluated. X-Rays reviewed. -Today again we discussed the conservative vs. surgical management. The patient opts for surgical management.  Risk benefits advantages and disadvantages explained.  Possible complications discussed.  All patient questions were answered. No guarantees were expressed or implied. -Authorization for surgery was initiated today.  Surgery will consist of bunionectomy with osteotomy right foot -Patient has a knee scooter at home for postoperative use.  She understands that she will be about 2 months out of work.  She is already told her supervisor -Return to clinic 1 week postop  *Works at [employer] on her feet all day.        Felecia Shelling, DPM [facility]  Dr. Felecia Shelling, DPM    [address] [place], [place] [address]                Office [phone]  Fax [phone]

## 2023-12-02 ENCOUNTER — Telehealth: Payer: Self-pay

## 2023-12-02 NOTE — Telephone Encounter (Signed)
Spoke with patient regarding normal potassium levels.

## 2023-12-02 NOTE — Telephone Encounter (Signed)
-----   Message from Carlean Jews sent at 11/30/2023  4:20 PM EST ----- Please let her know her potassium is back in normal range

## 2023-12-03 ENCOUNTER — Telehealth: Payer: Self-pay | Admitting: Podiatry

## 2023-12-03 NOTE — Telephone Encounter (Signed)
DOS-12/24/23  Regina Short [id number]  [employer] EFFECTIVE DATE- 12/22/22  DEDUCTIBLE- $700.00 WITH REMAINING $0.00 OOP-$3000.00 WITH REMAINING $0.00 COINSURANCE- 30%  PER THE AUTOMATED [employer] SYSTEM, PRIOR AUTH IS NOT REQUIRED FOR CPT CODE 45409  CALL REF #: [id number]

## 2023-12-09 ENCOUNTER — Telehealth: Payer: Self-pay

## 2023-12-09 NOTE — Telephone Encounter (Signed)
Lvm for patient to [facility] 12/18/23 appointment.

## 2023-12-11 ENCOUNTER — Other Ambulatory Visit: Payer: Self-pay | Admitting: Podiatry

## 2023-12-13 ENCOUNTER — Other Ambulatory Visit: Payer: Self-pay | Admitting: Physician Assistant

## 2023-12-18 ENCOUNTER — Encounter: Payer: Self-pay | Admitting: Nurse Practitioner

## 2023-12-18 ENCOUNTER — Encounter: Payer: 59 | Admitting: Nurse Practitioner

## 2023-12-18 ENCOUNTER — Ambulatory Visit: Payer: 59

## 2023-12-18 DIAGNOSIS — E1165 Type 2 diabetes mellitus with hyperglycemia: Secondary | ICD-10-CM | POA: Diagnosis not present

## 2023-12-20 LAB — MICROALBUMIN / CREATININE URINE RATIO
Creatinine, Urine: 154.5 mg/dL
Microalb/Creat Ratio: 6 mg/g{creat} (ref 0–29)
Microalbumin, Urine: 8.9 ug/mL

## 2023-12-21 ENCOUNTER — Encounter: Payer: 59 | Admitting: Physician Assistant

## 2023-12-21 ENCOUNTER — Encounter: Payer: Self-pay | Admitting: Nurse Practitioner

## 2023-12-21 ENCOUNTER — Ambulatory Visit (INDEPENDENT_AMBULATORY_CARE_PROVIDER_SITE_OTHER): Payer: 59 | Admitting: Nurse Practitioner

## 2023-12-21 VITALS — BP 128/78 | HR 67 | Temp 98.1°F | Resp 16 | Ht 66.0 in | Wt 152.2 lb

## 2023-12-21 DIAGNOSIS — Z0001 Encounter for general adult medical examination with abnormal findings: Secondary | ICD-10-CM | POA: Diagnosis not present

## 2023-12-21 DIAGNOSIS — E1159 Type 2 diabetes mellitus with other circulatory complications: Secondary | ICD-10-CM | POA: Diagnosis not present

## 2023-12-21 DIAGNOSIS — E785 Hyperlipidemia, unspecified: Secondary | ICD-10-CM

## 2023-12-21 DIAGNOSIS — I152 Hypertension secondary to endocrine disorders: Secondary | ICD-10-CM

## 2023-12-21 DIAGNOSIS — E611 Iron deficiency: Secondary | ICD-10-CM

## 2023-12-21 DIAGNOSIS — E1169 Type 2 diabetes mellitus with other specified complication: Secondary | ICD-10-CM | POA: Diagnosis not present

## 2023-12-21 MED ORDER — PRAVASTATIN SODIUM 10 MG PO TABS: 10.0000 mg | ORAL_TABLET | Freq: Every day | ORAL | 5 refills | Status: DC

## 2023-12-21 MED ORDER — BENAZEPRIL-HYDROCHLOROTHIAZIDE 5-6.25 MG PO TABS: 1.0000 | ORAL_TABLET | Freq: Every day | ORAL | 5 refills | Status: DC

## 2023-12-21 NOTE — Progress Notes (Cosign Needed)
[facility] [address] [place], [place] [address]  Internal MEDICINE  Office Visit Note  Patient Name: Regina Short  [id number]  [id number]  Date of Service: 12/21/2023  Chief Complaint  Patient presents with   Diabetes   Gastroesophageal Reflux   Hypertension   Annual Exam     Regina Short presents for an annual well visit and physical exam.  Well-appearing 62 y.o. female with hypertension, allergic rhinitis, diabetes, osteoarthritis, migraines, and low vitamin D  Routine CRC screening: due in 2032  Routine mammogram: due in August 2025 DEXA scan: due in 3 years  Pap smear: due in 2026  Eye exam: due now   foot exam: done today Labs: reviewed labs with patient New or worsening pain: right shoulder arthritis.  Other concerns:Trigger finger of 4th finger on left hand, plans to get this looked at at [facility] soon after her foot surgery this week.     Current Medication: Outpatient Encounter Medications as of 12/21/2023  Medication Sig   Accu-Chek FastClix Lancets MISC Use as directed to check blood sugars twice a day  E11.65   acetaminophen (TYLENOL) 500 MG tablet Take 2 tablets (1,000 mg total) by mouth every 6 (six) hours as needed.   Cholecalciferol 125 MCG (5000 UT) capsule Take 5,000 Units by mouth daily.   docusate sodium (COLACE) 100 MG capsule Take 1 capsule (100 mg total) by mouth 2 (two) times daily.   glucose blood (ACCU-CHEK GUIDE) test strip USE TO TEST BLOOD SUGARS TWICE A DAY AS NEEDED E11.65   glyBURIDE-metformin (GLUCOVANCE) 5-500 MG tablet TAKE 1.5 TABLET BY MOUTH TWICE A DAY OR IF BLOOD SUGAR DROPS TAKE 1 TABLET TWICE A DAY   methocarbamol (ROBAXIN) 500 MG tablet Take 1 tablet (500 mg total) by mouth every 8 (eight) hours as needed for muscle spasms.   polyethylene glycol powder (GLYCOLAX/MIRALAX) 17 GM/SCOOP powder Take 17 g by mouth daily.   sitaGLIPtin (JANUVIA) 100 MG tablet Take 1 tablet (100 mg total) by mouth daily.   traMADol (ULTRAM)  50 MG tablet Take 1 tablet (50 mg total) by mouth daily as needed.   [DISCONTINUED] benazepril (LOTENSIN) 10 MG tablet TAKE 1 TABLET BY MOUTH EVERY DAY   [DISCONTINUED] benazepril-hydrochlorthiazide (LOTENSIN HCT) 5-6.25 MG tablet Take 1 tablet by mouth daily.   [DISCONTINUED] pravastatin (PRAVACHOL) 10 MG tablet TAKE 1 TABLET BY MOUTH EVERY DAY   benazepril-hydrochlorthiazide (LOTENSIN HCT) 5-6.25 MG tablet Take 1 tablet by mouth daily.   pravastatin (PRAVACHOL) 10 MG tablet Take 1 tablet (10 mg total) by mouth daily.   No facility-administered encounter medications on file as of 12/21/2023.    Surgical History: Past Surgical History:  Procedure Laterality Date   ABDOMINAL HYSTERECTOMY     ABDOMINAL SURGERY     5 tumors removed   BREAST BIOPSY Right 02/02/15 core   focal fibroadenomatoid changes and sclerosis    Medical History: Past Medical History:  Diagnosis Date   Chronic back pain    Diabetes mellitus without complication (HCC)    GERD (gastroesophageal reflux disease)    Hand tendonitis    Hypertension     Family History: Family History  Problem Relation Age of Onset   Congestive Heart Failure Other    Breast cancer Maternal Aunt 26    Social History   Socioeconomic History   Marital status: Single    Spouse name: Not on file   Number of children: Not on file   Years of education: Not on file  Highest education level: Not on file  Occupational History   Not on file  Tobacco Use   Smoking status: Former    Current packs/day: 0.00    Types: Cigarettes    Quit date: 02/26/2021    Years since quitting: 2.8   Smokeless tobacco: Never   Tobacco comments:    Quit  Substance and Sexual Activity   Alcohol use: Yes    Comment: holidays - occasions   Drug use: No   Sexual activity: Yes    Birth control/protection: None  Other Topics Concern   Not on file  Social History Narrative   Not on file   Social Drivers of Health   Financial Resource Strain: Not  on file  Food Insecurity: No Food Insecurity (01/17/2023)   Hunger Vital Sign    Worried About Running Out of Food in the Last Year: Never true    Ran Out of Food in the Last Year: Never true  Transportation Needs: No Transportation Needs (01/17/2023)   PRAPARE - Administrator, Civil Service (Medical): No    Lack of Transportation (Non-Medical): No  Physical Activity: Not on file  Stress: Not on file  Social Connections: Not on file  Intimate Partner Violence: Not At Risk (01/17/2023)   Humiliation, Afraid, Rape, and Kick questionnaire    Fear of Current or Ex-Partner: No    Emotionally Abused: No    Physically Abused: No    Sexually Abused: No      Review of Systems  Constitutional:  Negative for chills, fatigue and unexpected weight change.  HENT:  Negative for congestion, rhinorrhea, sneezing and sore throat.   Eyes:  Positive for visual disturbance. Negative for redness.  Respiratory:  Negative for cough, chest tightness and shortness of breath.   Cardiovascular:  Negative for chest pain and palpitations.  Gastrointestinal:  Negative for abdominal pain, constipation, diarrhea, nausea and vomiting.  Genitourinary:  Negative for dysuria and frequency.  Musculoskeletal:  Positive for arthralgias and back pain. Negative for joint swelling and neck pain.  Skin:  Negative for rash.  Neurological: Negative.  Negative for tremors and numbness.  Hematological:  Negative for adenopathy. Does not bruise/bleed easily.  Psychiatric/Behavioral:  Negative for behavioral problems (Depression), sleep disturbance and suicidal ideas. The patient is not nervous/anxious.     Vital Signs: BP 128/78   Pulse 67   Temp 98.1 F (36.7 C)   Resp 16   Ht 5\' 6"  (1.676 m)   Wt 152 lb 3.2 oz (69 kg)   SpO2 98%   BMI 24.57 kg/m    Physical Exam Vitals and nursing note reviewed.  Constitutional:      General: She is not in acute distress.    Appearance: Normal appearance. She is  well-developed and normal weight. She is not diaphoretic.  HENT:     Head: Normocephalic and atraumatic.     Mouth/Throat:     Pharynx: No oropharyngeal exudate.  Neck:     Thyroid: No thyromegaly.     Vascular: No JVD.     Trachea: No tracheal deviation.  Cardiovascular:     Rate and Rhythm: Normal rate and regular rhythm.     Pulses:          Dorsalis pedis pulses are 3+ on the right side and 3+ on the left side.       Posterior tibial pulses are 3+ on the right side and 3+ on the left side.     Heart  sounds: Normal heart sounds. No murmur heard.    No friction rub. No gallop.  Pulmonary:     Effort: Pulmonary effort is normal. No respiratory distress.     Breath sounds: No wheezing or rales.  Chest:     Chest wall: No tenderness.  Abdominal:     General: Bowel sounds are normal.     Palpations: Abdomen is soft.     Tenderness: There is no abdominal tenderness.  Musculoskeletal:        General: No swelling or tenderness. Normal range of motion.     Cervical back: Normal range of motion and neck supple.     Right lower leg: No edema.     Left lower leg: No edema.     Right foot: Normal range of motion.     Left foot: Normal range of motion.  Feet:     Right foot:     Protective Sensation: 2 sites tested.  2 sites sensed.     Skin integrity: Skin integrity normal.     Toenail Condition: Right toenails are normal.     Left foot:     Protective Sensation: 2 sites tested.  2 sites sensed.     Skin integrity: Skin integrity normal.     Toenail Condition: Left toenails are normal.  Lymphadenopathy:     Cervical: No cervical adenopathy.  Skin:    General: Skin is warm and dry.  Neurological:     Mental Status: She is alert and oriented to person, place, and time.     Cranial Nerves: No cranial nerve deficit.  Psychiatric:        Behavior: Behavior normal.        Thought Content: Thought content normal.        Judgment: Judgment normal.        Assessment/Plan: 1.  Encounter for routine adult health examination with abnormal findings (Primary) Age-appropriate preventive screenings and vaccinations discussed, annual physical exam completed. Routine labs for health maintenance recent results reviewed with patient. PHM updated.    2. Type 2 diabetes mellitus with other specified complication, without long-term current use of insulin (HCC) Recent A1c is stable and in target range of less than 7.0. follow up in 3-4 months to repeat A1c.   3. Hypertension associated with diabetes (HCC) Stable, continue benazepril-hydrochlorothiazide as prescribed.  - benazepril-hydrochlorthiazide (LOTENSIN HCT) 5-6.25 MG tablet; Take 1 tablet by mouth daily.  Dispense: 30 tablet; Refill: 5  4. Hyperlipidemia associated with type 2 diabetes mellitus (HCC) Continue pravastatin as prescribed.  - pravastatin (PRAVACHOL) 10 MG tablet; Take 1 tablet (10 mg total) by mouth daily.  Dispense: 30 tablet; Refill: 5  5. Iron deficiency Repeat CBC and iron panel also ordered - CBC with Differential/Platelet - Iron, TIBC and Ferritin Panel      General Counseling: Keeghan verbalizes understanding of the findings of todays visit and agrees with plan of treatment. I have discussed any further diagnostic evaluation that may be needed or ordered today. We also reviewed her medications today. she has been encouraged to call the office with any questions or concerns that should arise related to todays visit.    Orders Placed This Encounter  Procedures   CBC with Differential/Platelet   Iron, TIBC and Ferritin Panel    Meds ordered this encounter  Medications   pravastatin (PRAVACHOL) 10 MG tablet    Sig: Take 1 tablet (10 mg total) by mouth daily.    Dispense:  30 tablet  Refill:  5    For future refills   benazepril-hydrochlorthiazide (LOTENSIN HCT) 5-6.25 MG tablet    Sig: Take 1 tablet by mouth daily.    Dispense:  30 tablet    Refill:  5    For future refills    Return  in about 4 months (around 04/20/2024) for F/U, Recheck A1C with LAUREN PCP.   Total time spent:30 Minutes Time spent includes review of chart, medications, test results, and follow up plan with the patient.   [place] Controlled Substance Database was reviewed by me.  This patient was seen by Sallyanne Kuster, FNP-C in collaboration with Dr. Beverely Risen as a part of collaborative care agreement.  Edvardo Honse R. Tedd Sias, MSN, FNP-C Internal medicine

## 2023-12-24 ENCOUNTER — Other Ambulatory Visit: Payer: Self-pay | Admitting: Podiatry

## 2023-12-24 DIAGNOSIS — G8918 Other acute postprocedural pain: Secondary | ICD-10-CM | POA: Diagnosis not present

## 2023-12-24 DIAGNOSIS — M2011 Hallux valgus (acquired), right foot: Secondary | ICD-10-CM | POA: Diagnosis not present

## 2023-12-24 MED ORDER — OXYCODONE-ACETAMINOPHEN 5-325 MG PO TABS
1.0000 | ORAL_TABLET | ORAL | 0 refills | Status: DC | PRN
Start: 2023-12-24 — End: 2024-08-12

## 2023-12-24 MED ORDER — IBUPROFEN 800 MG PO TABS: 800.0000 mg | ORAL_TABLET | Freq: Three times a day (TID) | ORAL | 1 refills | Status: DC

## 2023-12-24 NOTE — Progress Notes (Signed)
 PRN postop

## 2024-01-01 ENCOUNTER — Ambulatory Visit (INDEPENDENT_AMBULATORY_CARE_PROVIDER_SITE_OTHER): Payer: 59

## 2024-01-01 ENCOUNTER — Encounter: Payer: Self-pay | Admitting: Podiatry

## 2024-01-01 ENCOUNTER — Ambulatory Visit (INDEPENDENT_AMBULATORY_CARE_PROVIDER_SITE_OTHER): Payer: 59 | Admitting: Podiatry

## 2024-01-01 DIAGNOSIS — Z9889 Other specified postprocedural states: Secondary | ICD-10-CM | POA: Diagnosis not present

## 2024-01-01 NOTE — Progress Notes (Signed)
   Chief Complaint  Patient presents with   Routine Post Op    POV #1 DOS 12/24/2023 [place] BUNIONECTMY RT It's doing good.  It feels kind of funny on top of my foot.  It's tingling.  My pain level is a six.    Subjective:  Patient presents today status post bunionectomy with distal osteotomy of the right foot.  DOS: 12/24/2023.  Patient doing well.  Essentially she is NWB in the cam boot using a walker.  No new complaints  Past Medical History:  Diagnosis Date   Chronic back pain    Diabetes mellitus without complication (HCC)    GERD (gastroesophageal reflux disease)    Hand tendonitis    Hypertension     Past Surgical History:  Procedure Laterality Date   ABDOMINAL HYSTERECTOMY     ABDOMINAL SURGERY     5 tumors removed   BREAST BIOPSY Right 02/02/15 core   focal fibroadenomatoid changes and sclerosis    Allergies  Allergen Reactions   Micardis  [Telmisartan ]     rash   Ciprofloxacin Rash   Tramadol  Rash    Objective/Physical Exam Neurovascular status intact.  Incision well coapted with sutures intact. No sign of infectious process noted. No dehiscence. No active bleeding noted.  Moderate edema noted to the surgical extremity.  Radiographic Exam RT foot 01/01/2024:  Austin within the first metatarsal noted with intact orthopedic screws x 2.  Decent alignment of the first ray.  Assessment: 1. s/p bunionectomy with distal first metatarsal osteotomy right foot. DOS: 12/24/2023   Plan of Care:  -Patient was evaluated. X-rays reviewed - Dressings changed.  Leave clean dry and intact x 1 week -Continue strict NWB in the cam boot using the walker -Return to clinic 1 week suture removal   Thresa EMERSON Sar, DPM [facility]  Dr. Thresa EMERSON Sar, DPM    [address] [place], [place] [address]                Office [phone]  Fax [phone]

## 2024-01-07 DIAGNOSIS — E611 Iron deficiency: Secondary | ICD-10-CM | POA: Diagnosis not present

## 2024-01-08 LAB — CBC WITH DIFFERENTIAL/PLATELET
Basophils Absolute: 0.1 10*3/uL (ref 0.0–0.2)
Basos: 1 %
EOS (ABSOLUTE): 0.2 10*3/uL (ref 0.0–0.4)
Eos: 3 %
Hematocrit: 41.1 % (ref 34.0–46.6)
Hemoglobin: 13.1 g/dL (ref 11.1–15.9)
Immature Grans (Abs): 0.1 10*3/uL (ref 0.0–0.1)
Immature Granulocytes: 1 %
Lymphocytes Absolute: 2.4 10*3/uL (ref 0.7–3.1)
Lymphs: 34 %
MCH: 26.4 pg — ABNORMAL LOW (ref 26.6–33.0)
MCHC: 31.9 g/dL (ref 31.5–35.7)
MCV: 83 fL (ref 79–97)
Monocytes Absolute: 0.4 10*3/uL (ref 0.1–0.9)
Monocytes: 6 %
Neutrophils Absolute: 3.9 10*3/uL (ref 1.4–7.0)
Neutrophils: 55 %
Platelets: 359 10*3/uL (ref 150–450)
RBC: 4.97 x10E6/uL (ref 3.77–5.28)
RDW: 15.5 % — ABNORMAL HIGH (ref 11.7–15.4)
WBC: 7 10*3/uL (ref 3.4–10.8)

## 2024-01-08 LAB — IRON,TIBC AND FERRITIN PANEL
Ferritin: 85 ng/mL (ref 15–150)
Iron Saturation: 31 % (ref 15–55)
Iron: 110 ug/dL (ref 27–139)
Total Iron Binding Capacity: 350 ug/dL (ref 250–450)
UIBC: 240 ug/dL (ref 118–369)

## 2024-01-09 ENCOUNTER — Other Ambulatory Visit: Payer: Self-pay | Admitting: Physician Assistant

## 2024-01-09 DIAGNOSIS — I10 Essential (primary) hypertension: Secondary | ICD-10-CM

## 2024-01-09 NOTE — Progress Notes (Signed)
Error, rescheduled.

## 2024-01-12 ENCOUNTER — Encounter: Payer: Self-pay | Admitting: Podiatry

## 2024-01-12 ENCOUNTER — Ambulatory Visit: Payer: 59 | Admitting: Podiatry

## 2024-01-12 DIAGNOSIS — M2011 Hallux valgus (acquired), right foot: Secondary | ICD-10-CM

## 2024-01-12 NOTE — Progress Notes (Signed)
   Chief Complaint  Patient presents with   Routine Post Op    "It's doing."    Subjective:  Patient presents today status post bunionectomy with distal osteotomy of the right foot.  DOS: 12/24/2023.  Patient continued to do well.  She is minimal WBAT at the moment in the cam boot with crutches.  Past Medical History:  Diagnosis Date   Chronic back pain    Diabetes mellitus without complication (HCC)    GERD (gastroesophageal reflux disease)    Hand tendonitis    Hypertension     Past Surgical History:  Procedure Laterality Date   ABDOMINAL HYSTERECTOMY     ABDOMINAL SURGERY     5 tumors removed   BREAST BIOPSY Right 02/02/15 core   focal fibroadenomatoid changes and sclerosis    Allergies  Allergen Reactions   Micardis [Telmisartan]     rash   Ciprofloxacin Rash   Tramadol Rash    Objective/Physical Exam Neurovascular status intact.  Incision well coapted with sutures intact. No sign of infectious process noted. No dehiscence. No active bleeding noted.  Moderate edema noted to the surgical extremity.  Radiographic Exam RT foot 01/01/2024:  Austin within the first metatarsal noted with intact orthopedic screws x 2.  Decent alignment of the first ray.  Assessment: 1. s/p bunionectomy with distal first metatarsal osteotomy right foot. DOS: 12/24/2023   Plan of Care:  -Patient was evaluated.  -Sutures removed. -Continue strict NWB in the cam boot using the walker -Return to clinic 2 weeks follow-up x-ray   Felecia Shelling, DPM [facility]  Dr. Felecia Shelling, DPM    [address] [place], [place] [address]                Office [phone]  Fax [phone]

## 2024-01-26 ENCOUNTER — Ambulatory Visit (INDEPENDENT_AMBULATORY_CARE_PROVIDER_SITE_OTHER): Payer: 59 | Admitting: Podiatry

## 2024-01-26 ENCOUNTER — Ambulatory Visit (INDEPENDENT_AMBULATORY_CARE_PROVIDER_SITE_OTHER): Payer: 59

## 2024-01-26 DIAGNOSIS — Z9889 Other specified postprocedural states: Secondary | ICD-10-CM | POA: Diagnosis not present

## 2024-01-26 NOTE — Progress Notes (Signed)
   Chief Complaint  Patient presents with   Routine Post Op    POV #3 DOS 12/24/2023 [place] BUNIONECTMY RT    Subjective:  Patient presents today status post bunionectomy with distal osteotomy of the right foot.  DOS: 12/24/2023.  Patient continued to do well.  She is minimal WBAT at the moment in the cam boot with crutches.  Past Medical History:  Diagnosis Date   Chronic back pain    Diabetes mellitus without complication (HCC)    GERD (gastroesophageal reflux disease)    Hand tendonitis    Hypertension     Past Surgical History:  Procedure Laterality Date   ABDOMINAL HYSTERECTOMY     ABDOMINAL SURGERY     5 tumors removed   BREAST BIOPSY Right 02/02/15 core   focal fibroadenomatoid changes and sclerosis    Allergies  Allergen Reactions   Micardis  [Telmisartan ]     rash   Ciprofloxacin Rash   Tramadol  Rash    Objective/Physical Exam Neurovascular status intact.  Incision nicely healed.  Mild edema noted throughout the surgical area but overall good alignment of the first ray  Radiographic Exam RT foot 01/26/2024:  Unchanged.  Austin within the first metatarsal noted with intact orthopedic screws x 2.  Decent alignment of the first ray.  Degenerative changes noted to the first MTP  Assessment: 1. s/p bunionectomy with distal first metatarsal osteotomy right foot. DOS: 12/24/2023   Plan of Care:  -Patient was evaluated.  X-rays reviewed -Patient has been WBAT in the cam boot with the assistance of crutches.  She may now discontinue the crutches -Recommend range of motion exercises and massage to the foot and first MTP daily.  This was demonstrated with the patient today -The patient is uneasy about transitioning out of the boot for now.  Recommend full weightbearing in the boot for 4 weeks. -Return to clinic in 4 weeks and at this time we will discuss transitioning out of the cam boot into good supportive tennis shoes.  Physical therapy if needed at this time -Follow-up  x-rays in 4 weeks as well   Thresa EMERSON Sar, DPM [facility]  Dr. Thresa EMERSON Sar, DPM    [address] [place], [place] [address]                Office [phone]  Fax [phone]

## 2024-01-27 ENCOUNTER — Other Ambulatory Visit: Payer: Self-pay

## 2024-01-27 ENCOUNTER — Other Ambulatory Visit: Payer: Self-pay | Admitting: Physician Assistant

## 2024-01-27 DIAGNOSIS — N6489 Other specified disorders of breast: Secondary | ICD-10-CM

## 2024-01-27 DIAGNOSIS — R928 Other abnormal and inconclusive findings on diagnostic imaging of breast: Secondary | ICD-10-CM

## 2024-01-27 MED ORDER — GLYBURIDE-METFORMIN 5-500 MG PO TABS: ORAL_TABLET | ORAL | 0 refills | Status: DC

## 2024-02-23 ENCOUNTER — Ambulatory Visit (INDEPENDENT_AMBULATORY_CARE_PROVIDER_SITE_OTHER)

## 2024-02-23 ENCOUNTER — Encounter: Payer: Self-pay | Admitting: Podiatry

## 2024-02-23 ENCOUNTER — Ambulatory Visit (INDEPENDENT_AMBULATORY_CARE_PROVIDER_SITE_OTHER): Payer: 59 | Admitting: Podiatry

## 2024-02-23 DIAGNOSIS — Z9889 Other specified postprocedural states: Secondary | ICD-10-CM

## 2024-02-23 NOTE — Progress Notes (Signed)
   Chief Complaint  Patient presents with   Routine Post Op    POV  DOS 12/24/2023 [place] BUNIONECTMY RT      Subjective:  Patient presents today status post bunionectomy with distal osteotomy of the right foot.  DOS: 12/24/2023.  Patient continued to do well.  She is minimal WBAT at the moment in the cam boot with crutches.  Past Medical History:  Diagnosis Date   Chronic back pain    Diabetes mellitus without complication (HCC)    GERD (gastroesophageal reflux disease)    Hand tendonitis    Hypertension     Past Surgical History:  Procedure Laterality Date   ABDOMINAL HYSTERECTOMY     ABDOMINAL SURGERY     5 tumors removed   BREAST BIOPSY Right 02/02/15 core   focal fibroadenomatoid changes and sclerosis    Allergies  Allergen Reactions   Micardis [Telmisartan]     rash   Ciprofloxacin Rash   Tramadol Rash    Objective/Physical Exam Neurovascular status intact.  Incision nicely healed.  Mild edema noted  Radiographic Exam RT foot 02/23/2024:  Orthopedic screws are stable and intact.  Austin within the first metatarsal noted with intact orthopedic screws x 2.  Decent alignment of the first ray.  Degenerative changes noted to the first MTP  Assessment: 1. s/p bunionectomy with distal first metatarsal osteotomy right foot. DOS: 12/24/2023   Plan of Care:  -Patient was evaluated.  X-rays reviewed -Compression ankle sleeve dispensed.  Wear daily - Discontinue cam boot.  Patient may now transition out of the cam boot to good supportive tennis shoes and sneakers -Patient will slowly increase activity in tennis shoes.  Return to clinic in 4 weeks.  At that time the patient may tentatively return to work beginning 03/28/2024   Felecia Shelling, DPM [facility]  Dr. Felecia Shelling, DPM    [address] [place], [place] [address]                Office [phone]  Fax [phone]

## 2024-02-24 ENCOUNTER — Ambulatory Visit
Admission: RE | Admit: 2024-02-24 | Discharge: 2024-02-24 | Disposition: A | Discharge status: Home / Self Care | Payer: 59 | Source: Ambulatory Visit | Attending: Physician Assistant | Admitting: Physician Assistant

## 2024-02-24 DIAGNOSIS — N6489 Other specified disorders of breast: Secondary | ICD-10-CM | POA: Diagnosis not present

## 2024-02-24 DIAGNOSIS — R92333 Mammographic heterogeneous density, bilateral breasts: Secondary | ICD-10-CM | POA: Diagnosis not present

## 2024-02-24 DIAGNOSIS — R928 Other abnormal and inconclusive findings on diagnostic imaging of breast: Secondary | ICD-10-CM | POA: Insufficient documentation

## 2024-03-22 ENCOUNTER — Encounter: Payer: Self-pay | Admitting: Podiatry

## 2024-03-22 ENCOUNTER — Ambulatory Visit: Admitting: Podiatry

## 2024-03-22 DIAGNOSIS — Z9889 Other specified postprocedural states: Secondary | ICD-10-CM

## 2024-03-22 NOTE — Progress Notes (Signed)
   No chief complaint on file.   Subjective:  Patient presents today status post bunionectomy with distal osteotomy of the right foot.  DOS: 12/24/2023.  Patient is doing very well.  She is walking in regular tennis shoes.  She has been exercising without any pain or tenderness.  Overall she is very satisfied with no complaints  Past Medical History:  Diagnosis Date   Chronic back pain    Diabetes mellitus without complication (HCC)    GERD (gastroesophageal reflux disease)    Hand tendonitis    Hypertension     Past Surgical History:  Procedure Laterality Date   ABDOMINAL HYSTERECTOMY     ABDOMINAL SURGERY     5 tumors removed   BREAST BIOPSY Right 02/02/15 core   focal fibroadenomatoid changes and sclerosis    Allergies  Allergen Reactions   Micardis [Telmisartan]     rash   Ciprofloxacin Rash   Tramadol Rash    Objective/Physical Exam Neurovascular status intact.  Incision nicely healed.  No edema noted.  Range of motion to the great toe joint is WNL.  No tenderness with palpation or range of motion.  The toes in good rectus alignment  Radiographic Exam RT foot 02/23/2024:  Orthopedic screws are stable and intact.  Austin within the first metatarsal noted with intact orthopedic screws x 2.  Decent alignment of the first ray.  Degenerative changes noted to the first MTP  Assessment: 1. s/p bunionectomy with distal first metatarsal osteotomy right foot. DOS: 12/24/2023   Plan of Care:  -Patient was evaluated.   -Patient is ready to return to work.  She would like to return to work beginning tomorrow, 03/23/2024 no more than 8 hours per shift x 2 weeks.  After 2 weeks she may resume full activity no restrictions -Return to clinic as needed  Felecia Shelling, DPM [facility]  Dr. Felecia Shelling, DPM    [address] [place], [place] [address]                Office [phone]  Fax [phone]

## 2024-04-04 ENCOUNTER — Other Ambulatory Visit: Payer: Self-pay | Admitting: Physician Assistant

## 2024-04-04 ENCOUNTER — Other Ambulatory Visit: Payer: Self-pay | Admitting: Podiatry

## 2024-04-04 DIAGNOSIS — I10 Essential (primary) hypertension: Secondary | ICD-10-CM

## 2024-04-08 DIAGNOSIS — Z008 Encounter for other general examination: Secondary | ICD-10-CM | POA: Diagnosis not present

## 2024-04-15 ENCOUNTER — Ambulatory Visit: Payer: 59 | Admitting: Physician Assistant

## 2024-04-22 ENCOUNTER — Ambulatory Visit: Payer: 59 | Admitting: Physician Assistant

## 2024-04-22 ENCOUNTER — Encounter: Payer: Self-pay | Admitting: Physician Assistant

## 2024-04-22 VITALS — BP 138/80 | HR 71 | Temp 97.8°F | Resp 16 | Ht 66.0 in | Wt 154.0 lb

## 2024-04-22 DIAGNOSIS — I152 Hypertension secondary to endocrine disorders: Secondary | ICD-10-CM

## 2024-04-22 DIAGNOSIS — E1159 Type 2 diabetes mellitus with other circulatory complications: Secondary | ICD-10-CM

## 2024-04-22 DIAGNOSIS — M6530 Trigger finger, unspecified finger: Secondary | ICD-10-CM | POA: Diagnosis not present

## 2024-04-22 DIAGNOSIS — E1169 Type 2 diabetes mellitus with other specified complication: Secondary | ICD-10-CM | POA: Diagnosis not present

## 2024-04-22 LAB — POCT GLYCOSYLATED HEMOGLOBIN (HGB A1C): Hemoglobin A1C: 8.6 % — AB (ref 4.0–5.6)

## 2024-04-22 MED ORDER — SITAGLIPTIN PHOSPHATE 100 MG PO TABS: 100.0000 mg | ORAL_TABLET | Freq: Every day | ORAL | 1 refills | Status: DC

## 2024-04-22 NOTE — Progress Notes (Signed)
 [facility] [address] [place], [place] [address]  Internal MEDICINE  Office Visit Note  Patient Name: Regina Short  [id number]  [id number]  Date of Service: 04/22/2024  Chief Complaint  Patient presents with   Follow-up   Diabetes   Hypertension    HPI Pt is here for routine follow up -Had foot surgery in Jan, went well -Trigger finger on left ring finger and right middle finger. Would like referral to ortho -has been taking benazepril -hydrochlorothiazide  and doing well with this -having some sugar fluctuations not as active after surgery. States she was told at home practitioner visit 2 weeks ago, that A1c was 6.8?? However it is 8.6 today. -Still taking 1.5 tabs glucovance  BID, and Januvia  100mg . She is getting back into routine now and exercising and expect this to improve but advsied to monitor closely and call if this is not the case -has upcoming eye exam  Current Medication: Outpatient Encounter Medications as of 04/22/2024  Medication Sig   Accu-Chek FastClix Lancets MISC Use as directed to check blood sugars twice a day  E11.65   acetaminophen  (TYLENOL ) 500 MG tablet Take 2 tablets (1,000 mg total) by mouth every 6 (six) hours as needed.   benazepril -hydrochlorthiazide (LOTENSIN  HCT) 5-6.25 MG tablet Take 1 tablet by mouth daily.   Cholecalciferol 125 MCG (5000 UT) capsule Take 5,000 Units by mouth daily.   docusate sodium  (COLACE) 100 MG capsule Take 1 capsule (100 mg total) by mouth 2 (two) times daily.   glucose blood (ACCU-CHEK GUIDE) test strip USE TO TEST BLOOD SUGARS TWICE A DAY AS NEEDED E11.65   glyBURIDE -metformin  (GLUCOVANCE ) 5-500 MG tablet TAKE 1.5 TABLET BY MOUTH TWICE A DAY OR IF BLOOD SUGAR DROPS TAKE 1 TABLET TWICE A DAY   ibuprofen  (ADVIL ) 800 MG tablet TAKE 1 TABLET BY MOUTH THREE TIMES A DAY   methocarbamol  (ROBAXIN ) 500 MG tablet Take 1 tablet (500 mg total) by mouth every 8 (eight) hours as needed for muscle spasms.    oxyCODONE -acetaminophen  (PERCOCET) 5-325 MG tablet Take 1 tablet by mouth every 4 (four) hours as needed for severe pain (pain score 7-10).   polyethylene glycol powder (GLYCOLAX /MIRALAX ) 17 GM/SCOOP powder Take 17 g by mouth daily.   pravastatin  (PRAVACHOL ) 10 MG tablet Take 1 tablet (10 mg total) by mouth daily.   traMADol  (ULTRAM ) 50 MG tablet Take 1 tablet (50 mg total) by mouth daily as needed.   [DISCONTINUED] sitaGLIPtin  (JANUVIA ) 100 MG tablet Take 1 tablet (100 mg total) by mouth daily.   sitaGLIPtin  (JANUVIA ) 100 MG tablet Take 1 tablet (100 mg total) by mouth daily.   No facility-administered encounter medications on file as of 04/22/2024.    Surgical History: Past Surgical History:  Procedure Laterality Date   ABDOMINAL HYSTERECTOMY     ABDOMINAL SURGERY     5 tumors removed   BREAST BIOPSY Right 02/02/15 core   focal fibroadenomatoid changes and sclerosis    Medical History: Past Medical History:  Diagnosis Date   Chronic back pain    Diabetes mellitus without complication (HCC)    GERD (gastroesophageal reflux disease)    Hand tendonitis    Hypertension     Family History: Family History  Problem Relation Age of Onset   Congestive Heart Failure Other    Breast cancer Maternal Aunt 21    Social History   Socioeconomic History   Marital status: Single    Spouse name: Not on file   Number of children: Not  on file   Years of education: Not on file   Highest education level: Not on file  Occupational History   Not on file  Tobacco Use   Smoking status: Former    Current packs/day: 0.00    Types: Cigarettes    Quit date: 02/26/2021    Years since quitting: 3.1   Smokeless tobacco: Never   Tobacco comments:    Quit  Substance and Sexual Activity   Alcohol use: Yes    Comment: holidays - occasions   Drug use: No   Sexual activity: Yes    Birth control/protection: None  Other Topics Concern   Not on file  Social History Narrative   Not on file    Social Drivers of Health   Financial Resource Strain: Not on file  Food Insecurity: No Food Insecurity (01/17/2023)   Hunger Vital Sign    Worried About Running Out of Food in the Last Year: Never true    Ran Out of Food in the Last Year: Never true  Transportation Needs: No Transportation Needs (01/17/2023)   PRAPARE - Administrator, Civil Service (Medical): No    Lack of Transportation (Non-Medical): No  Physical Activity: Not on file  Stress: Not on file  Social Connections: Not on file  Intimate Partner Violence: Not At Risk (01/17/2023)   Humiliation, Afraid, Rape, and Kick questionnaire    Fear of Current or Ex-Partner: No    Emotionally Abused: No    Physically Abused: No    Sexually Abused: No      Review of Systems  Constitutional:  Negative for chills, fatigue and unexpected weight change.  HENT:  Negative for congestion, rhinorrhea, sneezing and sore throat.   Eyes:  Positive for visual disturbance. Negative for redness.  Respiratory:  Negative for cough, chest tightness and shortness of breath.   Cardiovascular:  Negative for chest pain and palpitations.  Gastrointestinal:  Negative for abdominal pain, constipation, diarrhea, nausea and vomiting.  Genitourinary:  Negative for dysuria and frequency.  Musculoskeletal:  Positive for arthralgias. Negative for joint swelling and neck pain.  Skin:  Negative for rash.  Neurological: Negative.  Negative for tremors and numbness.  Hematological:  Negative for adenopathy. Does not bruise/bleed easily.  Psychiatric/Behavioral:  Negative for behavioral problems (Depression), sleep disturbance and suicidal ideas. The patient is not nervous/anxious.     Vital Signs: BP 138/80   Pulse 71   Temp 97.8 F (36.6 C)   Resp 16   Ht 5\' 6"  (1.676 m)   Wt 154 lb (69.9 kg)   SpO2 97%   BMI 24.86 kg/m    Physical Exam Vitals and nursing note reviewed.  Constitutional:      General: She is not in acute distress.     Appearance: She is well-developed and normal weight. She is not diaphoretic.  HENT:     Head: Normocephalic and atraumatic.  Neck:     Thyroid: No thyromegaly.     Vascular: No JVD.     Trachea: No tracheal deviation.  Cardiovascular:     Rate and Rhythm: Normal rate and regular rhythm.     Heart sounds: Normal heart sounds. No murmur heard.    No friction rub. No gallop.  Pulmonary:     Effort: Pulmonary effort is normal. No respiratory distress.     Breath sounds: No wheezing or rales.  Chest:     Chest wall: No tenderness.  Musculoskeletal:  General: No swelling or tenderness. Normal range of motion.     Cervical back: Normal range of motion and neck supple.     Comments: Trigger finger left ring finger  Lymphadenopathy:     Cervical: No cervical adenopathy.  Skin:    General: Skin is warm and dry.  Neurological:     Mental Status: She is alert.  Psychiatric:        Behavior: Behavior normal.        Thought Content: Thought content normal.        Judgment: Judgment normal.        Assessment/Plan: 1. Type 2 diabetes mellitus with other specified complication, without long-term current use of insulin  (HCC) (Primary) - POCT HgB A1C is 8.6 which is significantly increased from 6.5 last visit. Likely increased from change in activity. Continue to monitor closely and call if numbers not improving - sitaGLIPtin  (JANUVIA ) 100 MG tablet; Take 1 tablet (100 mg total) by mouth daily.  Dispense: 90 tablet; Refill: 1  2. Hypertension associated with diabetes (HCC) Stable, continue current medication  3. Acquired trigger finger - AMB referral to orthopedics   General Counseling: lejla taflinger understanding of the findings of todays visit and agrees with plan of treatment. I have discussed any further diagnostic evaluation that may be needed or ordered today. We also reviewed her medications today. she has been encouraged to call the office with any questions or  concerns that should arise related to todays visit.    Orders Placed This Encounter  Procedures   AMB referral to orthopedics   POCT HgB A1C    Meds ordered this encounter  Medications   sitaGLIPtin  (JANUVIA ) 100 MG tablet    Sig: Take 1 tablet (100 mg total) by mouth daily.    Dispense:  90 tablet    Refill:  1    This patient was seen by Taylor Favia, PA-C in collaboration with Dr. Verneta Gone as a part of collaborative care agreement.   Total time spent:30 Minutes Time spent includes review of chart, medications, test results, and follow up plan with the patient.      Dr Fozia M Khan Internal medicine

## 2024-04-25 ENCOUNTER — Telehealth: Payer: Self-pay | Admitting: Physician Assistant

## 2024-04-25 NOTE — Telephone Encounter (Signed)
Orthopedic referral sent via [facility] to [facility]. Notified patient. Gave pt telephone # [phone]

## 2024-04-25 NOTE — Telephone Encounter (Signed)
 Orthopedic appointment 05/06/2024 @ [facility]

## 2024-05-13 DIAGNOSIS — M65341 Trigger finger, right ring finger: Secondary | ICD-10-CM | POA: Diagnosis not present

## 2024-05-13 DIAGNOSIS — M65332 Trigger finger, left middle finger: Secondary | ICD-10-CM | POA: Diagnosis not present

## 2024-05-13 DIAGNOSIS — M65342 Trigger finger, left ring finger: Secondary | ICD-10-CM | POA: Diagnosis not present

## 2024-05-13 DIAGNOSIS — M65331 Trigger finger, right middle finger: Secondary | ICD-10-CM | POA: Diagnosis not present

## 2024-05-15 ENCOUNTER — Other Ambulatory Visit: Payer: Self-pay | Admitting: Physician Assistant

## 2024-05-17 ENCOUNTER — Other Ambulatory Visit: Payer: Self-pay | Admitting: Physician Assistant

## 2024-05-18 ENCOUNTER — Telehealth: Payer: Self-pay

## 2024-05-18 NOTE — Telephone Encounter (Signed)
 Completed P.A. for patient's Glyburide -Metformin  5-500mg .

## 2024-06-02 DIAGNOSIS — E113393 Type 2 diabetes mellitus with moderate nonproliferative diabetic retinopathy without macular edema, bilateral: Secondary | ICD-10-CM | POA: Diagnosis not present

## 2024-06-08 ENCOUNTER — Telehealth: Payer: Self-pay

## 2024-06-08 ENCOUNTER — Other Ambulatory Visit: Payer: Self-pay | Admitting: Nurse Practitioner

## 2024-06-08 ENCOUNTER — Other Ambulatory Visit: Payer: Self-pay | Admitting: Physician Assistant

## 2024-06-08 DIAGNOSIS — E1159 Type 2 diabetes mellitus with other circulatory complications: Secondary | ICD-10-CM

## 2024-06-08 NOTE — Telephone Encounter (Signed)
 Accidentally d/c med glucovance  5-500  and its already approved PA sent med today

## 2024-06-27 ENCOUNTER — Encounter: Payer: Self-pay | Admitting: Physician Assistant

## 2024-06-27 ENCOUNTER — Ambulatory Visit (INDEPENDENT_AMBULATORY_CARE_PROVIDER_SITE_OTHER): Admitting: Physician Assistant

## 2024-06-27 VITALS — BP 115/68 | HR 86 | Temp 98.3°F | Resp 16 | Ht 66.0 in | Wt 156.6 lb

## 2024-06-27 DIAGNOSIS — I152 Hypertension secondary to endocrine disorders: Secondary | ICD-10-CM

## 2024-06-27 DIAGNOSIS — E1159 Type 2 diabetes mellitus with other circulatory complications: Secondary | ICD-10-CM | POA: Diagnosis not present

## 2024-06-27 DIAGNOSIS — E785 Hyperlipidemia, unspecified: Secondary | ICD-10-CM

## 2024-06-27 DIAGNOSIS — E1169 Type 2 diabetes mellitus with other specified complication: Secondary | ICD-10-CM

## 2024-06-27 MED ORDER — PRAVASTATIN SODIUM 10 MG PO TABS: 10.0000 mg | ORAL_TABLET | Freq: Every day | ORAL | 5 refills | Status: DC

## 2024-06-27 MED ORDER — ACCU-CHEK GUIDE ME W/DEVICE KIT: PACK | 0 refills | Status: DC

## 2024-06-27 NOTE — Progress Notes (Signed)
 [facility] [address] [place], [place] [address]  Internal MEDICINE  Office Visit Note  Patient Name: Regina Short  [id number]  [id number]  Date of Service: 06/27/2024  Chief Complaint  Patient presents with   Follow-up   Diabetes   Gastroesophageal Reflux   Hypertension   Medication Refill    HPI Pt is here for routine follow up -BG in AM fluctuate greatly still due to what she eats the night before, can range 118-208 -Eats dinner at 4pm at work, then not off until 6pm. Will get [facility] or other fastfood as these are only options nearby to work. Will get salad from [facility] sometimes.  -discussed meal prepping and she will try to bring meals to work to reduce fast food intake -may try protein shakes with veggies as she does not like much protein sources. Advised on low sugar protein shake options and to always check labels -BP stable -did have diabetic eye exam recently -is seeing ortho now for trigger fingers  Current Medication: Outpatient Encounter Medications as of 06/27/2024  Medication Sig   Blood Glucose Monitoring Suppl (ACCU-CHEK GUIDE ME) w/Device KIT Use as directed. E11.65   Accu-Chek FastClix Lancets MISC Use as directed to check blood sugars twice a day  E11.65   acetaminophen  (TYLENOL ) 500 MG tablet Take 2 tablets (1,000 mg total) by mouth every 6 (six) hours as needed.   benazepril -hydrochlorthiazide (LOTENSIN  HCT) 5-6.25 MG tablet TAKE 1 TABLET BY MOUTH EVERY DAY   Cholecalciferol 125 MCG (5000 UT) capsule Take 5,000 Units by mouth daily.   docusate sodium  (COLACE) 100 MG capsule Take 1 capsule (100 mg total) by mouth 2 (two) times daily.   glucose blood (ACCU-CHEK GUIDE) test strip USE TO TEST BLOOD SUGARS TWICE A DAY AS NEEDED E11.65   glyBURIDE -metformin  (GLUCOVANCE ) 5-500 MG tablet TAKE 1.5 TABLET BY MOUTH TWICE A DAY OR IF BLOOD SUGAR DROPS TAKE 1 TABLET TWICE A DAY   ibuprofen  (ADVIL ) 800 MG tablet TAKE 1 TABLET BY MOUTH THREE TIMES A DAY    methocarbamol  (ROBAXIN ) 500 MG tablet Take 1 tablet (500 mg total) by mouth every 8 (eight) hours as needed for muscle spasms.   oxyCODONE -acetaminophen  (PERCOCET) 5-325 MG tablet Take 1 tablet by mouth every 4 (four) hours as needed for severe pain (pain score 7-10).   polyethylene glycol powder (GLYCOLAX /MIRALAX ) 17 GM/SCOOP powder Take 17 g by mouth daily.   pravastatin  (PRAVACHOL ) 10 MG tablet Take 1 tablet (10 mg total) by mouth daily.   sitaGLIPtin  (JANUVIA ) 100 MG tablet Take 1 tablet (100 mg total) by mouth daily.   traMADol  (ULTRAM ) 50 MG tablet Take 1 tablet (50 mg total) by mouth daily as needed.   [DISCONTINUED] pravastatin  (PRAVACHOL ) 10 MG tablet Take 1 tablet (10 mg total) by mouth daily.   No facility-administered encounter medications on file as of 06/27/2024.    Surgical History: Past Surgical History:  Procedure Laterality Date   ABDOMINAL HYSTERECTOMY     ABDOMINAL SURGERY     5 tumors removed   BREAST BIOPSY Right 02/02/15 core   focal fibroadenomatoid changes and sclerosis    Medical History: Past Medical History:  Diagnosis Date   Chronic back pain    Diabetes mellitus without complication (HCC)    GERD (gastroesophageal reflux disease)    Hand tendonitis    Hypertension     Family History: Family History  Problem Relation Age of Onset   Congestive Heart Failure Other    Breast cancer  Maternal Aunt 6    Social History   Socioeconomic History   Marital status: Single    Spouse name: Not on file   Number of children: Not on file   Years of education: Not on file   Highest education level: Not on file  Occupational History   Not on file  Tobacco Use   Smoking status: Former    Current packs/day: 0.00    Types: Cigarettes    Quit date: 02/26/2021    Years since quitting: 3.3   Smokeless tobacco: Never   Tobacco comments:    Quit  Substance and Sexual Activity   Alcohol use: Yes    Comment: holidays - occasions   Drug use: No   Sexual  activity: Yes    Birth control/protection: None  Other Topics Concern   Not on file  Social History Narrative   Not on file   Social Drivers of Health   Financial Resource Strain: Not on file  Food Insecurity: No Food Insecurity (01/17/2023)   Hunger Vital Sign    Worried About Running Out of Food in the Last Year: Never true    Ran Out of Food in the Last Year: Never true  Transportation Needs: No Transportation Needs (01/17/2023)   PRAPARE - Administrator, Civil Service (Medical): No    Lack of Transportation (Non-Medical): No  Physical Activity: Not on file  Stress: Not on file  Social Connections: Not on file  Intimate Partner Violence: Not At Risk (01/17/2023)   Humiliation, Afraid, Rape, and Kick questionnaire    Fear of Current or Ex-Partner: No    Emotionally Abused: No    Physically Abused: No    Sexually Abused: No      Review of Systems  Constitutional:  Negative for chills, fatigue and unexpected weight change.  HENT:  Negative for congestion, rhinorrhea, sneezing and sore throat.   Eyes:  Positive for visual disturbance. Negative for redness.  Respiratory:  Negative for cough, chest tightness and shortness of breath.   Cardiovascular:  Negative for chest pain and palpitations.  Gastrointestinal:  Negative for abdominal pain, constipation, diarrhea, nausea and vomiting.  Genitourinary:  Negative for dysuria and frequency.  Musculoskeletal:  Positive for arthralgias. Negative for joint swelling and neck pain.  Skin:  Negative for rash.  Neurological: Negative.  Negative for tremors and numbness.  Hematological:  Negative for adenopathy. Does not bruise/bleed easily.  Psychiatric/Behavioral:  Negative for behavioral problems (Depression), sleep disturbance and suicidal ideas. The patient is not nervous/anxious.     Vital Signs: BP 115/68   Pulse 86   Temp 98.3 F (36.8 C)   Resp 16   Ht 5' 6 (1.676 m)   Wt 156 lb 9.6 oz (71 kg)   SpO2 92%    BMI 25.28 kg/m    Physical Exam Vitals and nursing note reviewed.  Constitutional:      General: She is not in acute distress.    Appearance: She is well-developed and normal weight. She is not diaphoretic.  HENT:     Head: Normocephalic and atraumatic.  Neck:     Thyroid: No thyromegaly.     Vascular: No JVD.     Trachea: No tracheal deviation.  Cardiovascular:     Rate and Rhythm: Normal rate and regular rhythm.     Heart sounds: Normal heart sounds. No murmur heard.    No friction rub. No gallop.  Pulmonary:     Effort: Pulmonary effort is  normal. No respiratory distress.     Breath sounds: No wheezing or rales.  Chest:     Chest wall: No tenderness.  Musculoskeletal:        General: No swelling or tenderness. Normal range of motion.     Cervical back: Normal range of motion and neck supple.  Lymphadenopathy:     Cervical: No cervical adenopathy.  Skin:    General: Skin is warm and dry.  Neurological:     Mental Status: She is alert.  Psychiatric:        Behavior: Behavior normal.        Thought Content: Thought content normal.        Judgment: Judgment normal.        Assessment/Plan: 1. Type 2 diabetes mellitus with other specified complication, without long-term current use of insulin  (HCC) (Primary) Will do better about diet and try increasing protein and veggies and avoiding carbs and fast food options. Will keep meds the same as she makes diet adjustment and will keep log. Given additional diabetes diet-planning guide to help - Blood Glucose Monitoring Suppl (ACCU-CHEK GUIDE ME) w/Device KIT; Use as directed. E11.65  Dispense: 1 kit; Refill: 0  2. Hyperlipidemia associated with type 2 diabetes mellitus (HCC) Continue pravastatin  - pravastatin  (PRAVACHOL ) 10 MG tablet; Take 1 tablet (10 mg total) by mouth daily.  Dispense: 30 tablet; Refill: 5  3. Hypertension associated with diabetes (HCC) Stable, continue benazapril-hydrochlorothiazide  as  before   General Counseling: Kimika verbalizes understanding of the findings of todays visit and agrees with plan of treatment. I have discussed any further diagnostic evaluation that may be needed or ordered today. We also reviewed her medications today. she has been encouraged to call the office with any questions or concerns that should arise related to todays visit.    No orders of the defined types were placed in this encounter.   Meds ordered this encounter  Medications   pravastatin  (PRAVACHOL ) 10 MG tablet    Sig: Take 1 tablet (10 mg total) by mouth daily.    Dispense:  30 tablet    Refill:  5    For future refills   Blood Glucose Monitoring Suppl (ACCU-CHEK GUIDE ME) w/Device KIT    Sig: Use as directed. E11.65    Dispense:  1 kit    Refill:  0    This patient was seen by Tinnie Pro, PA-C in collaboration with Dr. Sigrid Bathe as a part of collaborative care agreement.   Total time spent:30 Minutes Time spent includes review of chart, medications, test results, and follow up plan with the patient.      Dr Fozia M Khan Internal medicine

## 2024-06-28 ENCOUNTER — Other Ambulatory Visit: Payer: Self-pay | Admitting: Physician Assistant

## 2024-06-28 DIAGNOSIS — I10 Essential (primary) hypertension: Secondary | ICD-10-CM

## 2024-07-09 ENCOUNTER — Other Ambulatory Visit: Payer: Self-pay | Admitting: Physician Assistant

## 2024-07-09 DIAGNOSIS — E1165 Type 2 diabetes mellitus with hyperglycemia: Secondary | ICD-10-CM

## 2024-07-12 ENCOUNTER — Other Ambulatory Visit: Payer: Self-pay | Admitting: Physician Assistant

## 2024-07-12 DIAGNOSIS — Z1231 Encounter for screening mammogram for malignant neoplasm of breast: Secondary | ICD-10-CM

## 2024-07-12 DIAGNOSIS — R928 Other abnormal and inconclusive findings on diagnostic imaging of breast: Secondary | ICD-10-CM

## 2024-07-12 DIAGNOSIS — N6489 Other specified disorders of breast: Secondary | ICD-10-CM

## 2024-07-18 DIAGNOSIS — I10 Essential (primary) hypertension: Secondary | ICD-10-CM | POA: Diagnosis not present

## 2024-07-18 DIAGNOSIS — I8001 Phlebitis and thrombophlebitis of superficial vessels of right lower extremity: Secondary | ICD-10-CM | POA: Diagnosis not present

## 2024-07-26 ENCOUNTER — Other Ambulatory Visit: Payer: Self-pay | Admitting: Physician Assistant

## 2024-07-26 DIAGNOSIS — E1169 Type 2 diabetes mellitus with other specified complication: Secondary | ICD-10-CM

## 2024-08-12 ENCOUNTER — Encounter: Payer: Self-pay | Admitting: Physician Assistant

## 2024-08-12 ENCOUNTER — Ambulatory Visit (INDEPENDENT_AMBULATORY_CARE_PROVIDER_SITE_OTHER): Admitting: Physician Assistant

## 2024-08-12 VITALS — BP 138/72 | HR 62 | Temp 97.8°F | Resp 16 | Ht 66.0 in | Wt 150.0 lb

## 2024-08-12 DIAGNOSIS — Z87891 Personal history of nicotine dependence: Secondary | ICD-10-CM

## 2024-08-12 DIAGNOSIS — E1169 Type 2 diabetes mellitus with other specified complication: Secondary | ICD-10-CM

## 2024-08-12 DIAGNOSIS — E11649 Type 2 diabetes mellitus with hypoglycemia without coma: Secondary | ICD-10-CM | POA: Diagnosis not present

## 2024-08-12 DIAGNOSIS — I10 Essential (primary) hypertension: Secondary | ICD-10-CM

## 2024-08-12 LAB — POCT GLYCOSYLATED HEMOGLOBIN (HGB A1C): Hemoglobin A1C: 7 % — AB (ref 4.0–5.6)

## 2024-08-12 MED ORDER — DEXCOM G7 SENSOR MISC: 3 refills | Status: DC

## 2024-08-12 MED ORDER — SITAGLIPTIN PHOSPHATE 100 MG PO TABS: 100.0000 mg | ORAL_TABLET | Freq: Every day | ORAL | 1 refills | Status: DC

## 2024-08-12 NOTE — Progress Notes (Signed)
 [facility] [address] [place], [place] [address]  Internal MEDICINE  Office Visit Note  Patient Name: Regina Short  [id number]  [id number]  Date of Service: 08/15/2024  Chief Complaint  Patient presents with   Follow-up   Diabetes   Hypertension    HPI Pt is here for routine follow up -BG dropping some at work and will eat something. Highest is 160 with eating -lowest 47 but had not eaten that evening and likely reason for this -discussed she may drop to 1 tab glucovance  in PM if lows instead of 1.5 tabs -BP not checked at home recently -going to get flu shot today -will try for CGM as she has had some lows-will need sensor only -quit smoking for 3 years now! Was smoking 1/2ppd, started in her 3s. Will move forward CT lung cancer screening -mammo and US  in Dec  Current Medication: Outpatient Encounter Medications as of 08/12/2024  Medication Sig   Accu-Chek FastClix Lancets MISC Use as directed to check blood sugars twice a day  E11.65   ACCU-CHEK GUIDE TEST test strip USE TO TEST BLOOD SUGARS TWICE A DAY AS NEEDED E11.65   acetaminophen  (TYLENOL ) 500 MG tablet Take 2 tablets (1,000 mg total) by mouth every 6 (six) hours as needed.   benazepril -hydrochlorthiazide (LOTENSIN  HCT) 5-6.25 MG tablet TAKE 1 TABLET BY MOUTH EVERY DAY   Blood Glucose Monitoring Suppl (ACCU-CHEK GUIDE ME) w/Device KIT USE AS DIRECTED. E11.65   Cholecalciferol 125 MCG (5000 UT) capsule Take 5,000 Units by mouth daily.   Continuous Glucose Sensor (DEXCOM G7 SENSOR) MISC Use as directed   docusate sodium  (COLACE) 100 MG capsule Take 1 capsule (100 mg total) by mouth 2 (two) times daily.   glyBURIDE -metformin  (GLUCOVANCE ) 5-500 MG tablet TAKE 1.5 TABLET BY MOUTH TWICE A DAY OR IF BLOOD SUGAR DROPS TAKE 1 TABLET TWICE A DAY   ibuprofen  (ADVIL ) 800 MG tablet TAKE 1 TABLET BY MOUTH THREE TIMES A DAY   polyethylene glycol powder (GLYCOLAX /MIRALAX ) 17 GM/SCOOP powder Take 17 g by mouth daily.    pravastatin  (PRAVACHOL ) 10 MG tablet Take 1 tablet (10 mg total) by mouth daily.   [DISCONTINUED] methocarbamol  (ROBAXIN ) 500 MG tablet Take 1 tablet (500 mg total) by mouth every 8 (eight) hours as needed for muscle spasms.   [DISCONTINUED] oxyCODONE -acetaminophen  (PERCOCET) 5-325 MG tablet Take 1 tablet by mouth every 4 (four) hours as needed for severe pain (pain score 7-10).   [DISCONTINUED] sitaGLIPtin  (JANUVIA ) 100 MG tablet Take 1 tablet (100 mg total) by mouth daily.   [DISCONTINUED] traMADol  (ULTRAM ) 50 MG tablet Take 1 tablet (50 mg total) by mouth daily as needed.   sitaGLIPtin  (JANUVIA ) 100 MG tablet Take 1 tablet (100 mg total) by mouth daily.   No facility-administered encounter medications on file as of 08/12/2024.    Surgical History: Past Surgical History:  Procedure Laterality Date   ABDOMINAL HYSTERECTOMY     ABDOMINAL SURGERY     5 tumors removed   BREAST BIOPSY Right 02/02/15 core   focal fibroadenomatoid changes and sclerosis    Medical History: Past Medical History:  Diagnosis Date   Chronic back pain    Diabetes mellitus without complication (HCC)    GERD (gastroesophageal reflux disease)    Hand tendonitis    Hypertension     Family History: Family History  Problem Relation Age of Onset   Congestive Heart Failure Other    Breast cancer Maternal Aunt 98    Social History  Socioeconomic History   Marital status: Single    Spouse name: Not on file   Number of children: Not on file   Years of education: Not on file   Highest education level: Not on file  Occupational History   Not on file  Tobacco Use   Smoking status: Former    Current packs/day: 0.00    Types: Cigarettes    Quit date: 02/26/2021    Years since quitting: 3.4   Smokeless tobacco: Never   Tobacco comments:    Quit  Substance and Sexual Activity   Alcohol use: Yes    Comment: holidays - occasions   Drug use: No   Sexual activity: Yes    Birth control/protection: None   Other Topics Concern   Not on file  Social History Narrative   Not on file   Social Drivers of Health   Financial Resource Strain: Not on file  Food Insecurity: No Food Insecurity (01/17/2023)   Hunger Vital Sign    Worried About Running Out of Food in the Last Year: Never true    Ran Out of Food in the Last Year: Never true  Transportation Needs: No Transportation Needs (01/17/2023)   PRAPARE - Administrator, Civil Service (Medical): No    Lack of Transportation (Non-Medical): No  Physical Activity: Not on file  Stress: Not on file  Social Connections: Not on file  Intimate Partner Violence: Not At Risk (01/17/2023)   Humiliation, Afraid, Rape, and Kick questionnaire    Fear of Current or Ex-Partner: No    Emotionally Abused: No    Physically Abused: No    Sexually Abused: No      Review of Systems  Constitutional:  Negative for chills, fatigue and unexpected weight change.  HENT:  Negative for congestion, rhinorrhea, sneezing and sore throat.   Eyes:  Positive for visual disturbance. Negative for redness.  Respiratory:  Negative for cough, chest tightness and shortness of breath.   Cardiovascular:  Negative for chest pain and palpitations.  Gastrointestinal:  Negative for abdominal pain, constipation, diarrhea, nausea and vomiting.  Genitourinary:  Negative for dysuria and frequency.  Musculoskeletal:  Positive for arthralgias. Negative for joint swelling and neck pain.  Skin:  Negative for rash.  Neurological: Negative.  Negative for tremors and numbness.  Hematological:  Negative for adenopathy. Does not bruise/bleed easily.  Psychiatric/Behavioral:  Negative for behavioral problems (Depression), sleep disturbance and suicidal ideas. The patient is not nervous/anxious.     Vital Signs: BP 138/72 Comment: 150/90  Pulse 62   Temp 97.8 F (36.6 C)   Resp 16   Ht 5' 6 (1.676 m)   Wt 150 lb (68 kg)   SpO2 99%   BMI 24.21 kg/m    Physical  Exam Vitals and nursing note reviewed.  Constitutional:      General: She is not in acute distress.    Appearance: She is well-developed and normal weight. She is not diaphoretic.  HENT:     Head: Normocephalic and atraumatic.  Neck:     Thyroid: No thyromegaly.     Vascular: No JVD.     Trachea: No tracheal deviation.  Cardiovascular:     Rate and Rhythm: Normal rate and regular rhythm.     Heart sounds: Normal heart sounds. No murmur heard.    No friction rub. No gallop.  Pulmonary:     Effort: Pulmonary effort is normal. No respiratory distress.     Breath sounds: No  wheezing or rales.  Chest:     Chest wall: No tenderness.  Musculoskeletal:        General: No swelling or tenderness. Normal range of motion.     Cervical back: Normal range of motion and neck supple.  Lymphadenopathy:     Cervical: No cervical adenopathy.  Skin:    General: Skin is warm and dry.  Neurological:     Mental Status: She is alert.  Psychiatric:        Behavior: Behavior normal.        Thought Content: Thought content normal.        Judgment: Judgment normal.        Assessment/Plan: 1. Type 2 diabetes mellitus with hypoglycemia without coma, without long-term current use of insulin  (HCC) (Primary) - POCT HgB A1C is 7 which is improved from 8.6 last visit and pt applauded for this improvement. Discussed decreasing nightly glucovance  to 1 tab instead of 1.5 if continued lows and to ensure eating at regular intervals. Will also send CGM to help with monitoring given hypoglycemic at times - Microalbumin / creatinine urine ratio - sitaGLIPtin  (JANUVIA ) 100 MG tablet; Take 1 tablet (100 mg total) by mouth daily.  Dispense: 90 tablet; Refill: 1 - Continuous Glucose Sensor (DEXCOM G7 SENSOR) MISC; Use as directed  Dispense: 3 each; Refill: 3  2. Essential hypertension Stable, continue current medication  3. Former smoker Will order low dose screening CT since ~40year hx of smoking until 3 years  ago - CT CHEST LUNG CA SCREEN LOW DOSE W/O CM; Future   General Counseling: Wylma verbalizes understanding of the findings of todays visit and agrees with plan of treatment. I have discussed any further diagnostic evaluation that may be needed or ordered today. We also reviewed her medications today. she has been encouraged to call the office with any questions or concerns that should arise related to todays visit.    Orders Placed This Encounter  Procedures   CT CHEST LUNG CA SCREEN LOW DOSE W/O CM   Microalbumin / creatinine urine ratio   POCT HgB A1C    Meds ordered this encounter  Medications   sitaGLIPtin  (JANUVIA ) 100 MG tablet    Sig: Take 1 tablet (100 mg total) by mouth daily.    Dispense:  90 tablet    Refill:  1   Continuous Glucose Sensor (DEXCOM G7 SENSOR) MISC    Sig: Use as directed    Dispense:  3 each    Refill:  3    This patient was seen by Tinnie Pro, PA-C in collaboration with Dr. Sigrid Bathe as a part of collaborative care agreement.   Total time spent:30 Minutes Time spent includes review of chart, medications, test results, and follow up plan with the patient.      Dr Fozia M Khan Internal medicine

## 2024-08-13 LAB — MICROALBUMIN / CREATININE URINE RATIO
Creatinine, Urine: 90.6 mg/dL
Microalb/Creat Ratio: 5 mg/g{creat} (ref 0–29)
Microalbumin, Urine: 4.5 ug/mL

## 2024-08-19 ENCOUNTER — Telehealth: Payer: Self-pay

## 2024-08-19 NOTE — Telephone Encounter (Signed)
 PA was approved for Januvia  and patient was notified.

## 2024-09-16 ENCOUNTER — Other Ambulatory Visit

## 2024-09-16 ENCOUNTER — Ambulatory Visit

## 2024-09-16 ENCOUNTER — Encounter

## 2024-09-23 ENCOUNTER — Ambulatory Visit
Admission: RE | Admit: 2024-09-23 | Discharge: 2024-09-23 | Disposition: A | Discharge status: Home / Self Care | Source: Ambulatory Visit | Attending: Physician Assistant | Admitting: Physician Assistant

## 2024-09-23 DIAGNOSIS — R928 Other abnormal and inconclusive findings on diagnostic imaging of breast: Secondary | ICD-10-CM | POA: Insufficient documentation

## 2024-09-23 DIAGNOSIS — R92333 Mammographic heterogeneous density, bilateral breasts: Secondary | ICD-10-CM | POA: Diagnosis not present

## 2024-09-23 DIAGNOSIS — N6489 Other specified disorders of breast: Secondary | ICD-10-CM | POA: Insufficient documentation

## 2024-09-23 DIAGNOSIS — Z1231 Encounter for screening mammogram for malignant neoplasm of breast: Secondary | ICD-10-CM | POA: Diagnosis not present

## 2024-09-26 NOTE — Progress Notes (Signed)
 KINGSTON GUILES                                          MRN: [id number]   09/26/2024   The [facility] Quality Team Specialist reviewed this patient medical record for the purposes of chart review for care gap closure. The following were reviewed: chart review for care gap closure-glycemic status assessment.    [facility] Quality Team

## 2024-09-30 ENCOUNTER — Ambulatory Visit
Admission: RE | Admit: 2024-09-30 | Discharge: 2024-09-30 | Disposition: A | Discharge status: Home / Self Care | Source: Ambulatory Visit | Attending: Physician Assistant | Admitting: Physician Assistant

## 2024-09-30 DIAGNOSIS — Z87891 Personal history of nicotine dependence: Secondary | ICD-10-CM | POA: Diagnosis not present

## 2024-10-12 ENCOUNTER — Ambulatory Visit: Payer: Self-pay | Admitting: Physician Assistant

## 2024-10-12 NOTE — Telephone Encounter (Signed)
Spoke with patient regarding chest CT.

## 2024-10-12 NOTE — Telephone Encounter (Signed)
-----   Message from Tinnie MARLA Pro sent at 10/12/2024  1:19 PM EDT ----- Please let her know that her CT chest screening did not show any suspicious pulmonary nodules. It did show mild emphysema (COPD) and aortic atherosclerosis. Will plan to repeat in 1 year for annual  screening ----- Message ----- From: Interface, Rad Results In Sent: 10/04/2024   4:36 PM EDT To: Tinnie MARLA Pro, PA-C

## 2024-12-03 ENCOUNTER — Other Ambulatory Visit: Payer: Self-pay | Admitting: Physician Assistant

## 2024-12-15 ENCOUNTER — Other Ambulatory Visit: Payer: Self-pay | Admitting: Physician Assistant

## 2024-12-15 DIAGNOSIS — E1159 Type 2 diabetes mellitus with other circulatory complications: Secondary | ICD-10-CM

## 2024-12-23 ENCOUNTER — Encounter: Payer: Self-pay | Admitting: Physician Assistant

## 2024-12-23 ENCOUNTER — Ambulatory Visit (INDEPENDENT_AMBULATORY_CARE_PROVIDER_SITE_OTHER): Payer: 59 | Admitting: Physician Assistant

## 2024-12-23 VITALS — BP 105/65 | HR 68 | Temp 98.0°F | Resp 16 | Ht 66.0 in | Wt 155.0 lb

## 2024-12-23 DIAGNOSIS — E538 Deficiency of other specified B group vitamins: Secondary | ICD-10-CM | POA: Diagnosis not present

## 2024-12-23 DIAGNOSIS — Z1329 Encounter for screening for other suspected endocrine disorder: Secondary | ICD-10-CM | POA: Diagnosis not present

## 2024-12-23 DIAGNOSIS — E559 Vitamin D deficiency, unspecified: Secondary | ICD-10-CM

## 2024-12-23 DIAGNOSIS — R5383 Other fatigue: Secondary | ICD-10-CM | POA: Diagnosis not present

## 2024-12-23 DIAGNOSIS — E1165 Type 2 diabetes mellitus with hyperglycemia: Secondary | ICD-10-CM

## 2024-12-23 DIAGNOSIS — R300 Dysuria: Secondary | ICD-10-CM

## 2024-12-23 DIAGNOSIS — Z0001 Encounter for general adult medical examination with abnormal findings: Secondary | ICD-10-CM

## 2024-12-23 DIAGNOSIS — E782 Mixed hyperlipidemia: Secondary | ICD-10-CM | POA: Diagnosis not present

## 2024-12-23 MED ORDER — ACCU-CHEK FASTCLIX LANCETS MISC: 1 refills | Status: AC

## 2024-12-23 NOTE — Progress Notes (Signed)
 " [facility] [address] [place], [place] [address]  Internal MEDICINE  Office Visit Note  Patient Name: Regina Short  [id number]  [id number]  Date of Service: 12/23/2024  Chief Complaint  Patient presents with   Annual Exam   Diabetes   Gastroesophageal Reflux   Hypertension   Medication Refill    Lancets     HPI Pt is here for routine health maintenance examination -doing well today -BP stable -CT previously reviewed, pt denies any breathing concerns -reacting to adhesive of dexcom, so finger pricking instead to monitor BG -trying to increase exercise -due for pap in October, states she had partial hysterectomy and that she still has her cervix -mammogram done in oct--needs 1 year diagnostic in Oct 2026 -UTD on colonoscopy -foot exam done  Current Medication: Outpatient Encounter Medications as of 12/23/2024  Medication Sig   ACCU-CHEK GUIDE TEST test strip USE TO TEST BLOOD SUGARS TWICE A DAY AS NEEDED E11.65   acetaminophen  (TYLENOL ) 500 MG tablet Take 2 tablets (1,000 mg total) by mouth every 6 (six) hours as needed.   benazepril -hydrochlorthiazide (LOTENSIN  HCT) 5-6.25 MG tablet TAKE 1 TABLET BY MOUTH EVERY DAY   Blood Glucose Monitoring Suppl (ACCU-CHEK GUIDE ME) w/Device KIT USE AS DIRECTED. E11.65   Cholecalciferol 125 MCG (5000 UT) capsule Take 5,000 Units by mouth daily.   docusate sodium  (COLACE) 100 MG capsule Take 1 capsule (100 mg total) by mouth 2 (two) times daily.   glyBURIDE -metformin  (GLUCOVANCE ) 5-500 MG tablet TAKE 1 & 1/2 TABLETS BY MOUTH TWICE DAILY OR IF BLOOD SUGAR DROPS, TAKE 1 TABLET TWICE DAILY   ibuprofen  (ADVIL ) 800 MG tablet TAKE 1 TABLET BY MOUTH THREE TIMES A DAY   polyethylene glycol powder (GLYCOLAX /MIRALAX ) 17 GM/SCOOP powder Take 17 g by mouth daily.   pravastatin  (PRAVACHOL ) 10 MG tablet Take 1 tablet (10 mg total) by mouth daily.   sitaGLIPtin  (JANUVIA ) 100 MG tablet Take 1 tablet (100 mg total) by mouth daily.    [DISCONTINUED] Accu-Chek FastClix Lancets MISC Use as directed to check blood sugars twice a day  E11.65   [DISCONTINUED] Continuous Glucose Sensor (DEXCOM G7 SENSOR) MISC Use as directed   Accu-Chek FastClix Lancets MISC Use as directed to check blood sugars twice a day  E11.65   No facility-administered encounter medications on file as of 12/23/2024.    Surgical History: Past Surgical History:  Procedure Laterality Date   ABDOMINAL HYSTERECTOMY     ABDOMINAL SURGERY     5 tumors removed   BREAST BIOPSY Right 02/02/15 core   focal fibroadenomatoid changes and sclerosis    Medical History: Past Medical History:  Diagnosis Date   Chronic back pain    Diabetes mellitus without complication (HCC)    GERD (gastroesophageal reflux disease)    Hand tendonitis    Hypertension     Family History: Family History  Problem Relation Age of Onset   Congestive Heart Failure Other    Breast cancer Maternal Aunt 60      Review of Systems  Constitutional:  Negative for chills, fatigue and unexpected weight change.  HENT:  Negative for congestion, rhinorrhea, sneezing and sore throat.   Eyes:  Positive for visual disturbance. Negative for redness.  Respiratory:  Negative for cough, chest tightness and shortness of breath.   Cardiovascular:  Negative for chest pain and palpitations.  Gastrointestinal:  Negative for abdominal pain, constipation, diarrhea, nausea and vomiting.  Genitourinary:  Negative for dysuria and frequency.  Musculoskeletal:  Positive for arthralgias. Negative for joint swelling and neck pain.  Skin:  Negative for rash.  Neurological: Negative.  Negative for tremors and numbness.  Hematological:  Negative for adenopathy. Does not bruise/bleed easily.  Psychiatric/Behavioral:  Negative for behavioral problems (Depression), sleep disturbance and suicidal ideas. The patient is not nervous/anxious.      Vital Signs: BP 105/65   Pulse 68   Temp 98 F (36.7 C)   Resp  16   Ht 5' 6 (1.676 m)   Wt 155 lb (70.3 kg)   SpO2 98%   BMI 25.02 kg/m    Physical Exam Vitals and nursing note reviewed.  Constitutional:      General: She is not in acute distress.    Appearance: She is well-developed and normal weight. She is not diaphoretic.  HENT:     Head: Normocephalic and atraumatic.     Mouth/Throat:     Pharynx: No posterior oropharyngeal erythema.  Neck:     Thyroid: No thyromegaly.     Vascular: No JVD.     Trachea: No tracheal deviation.  Cardiovascular:     Rate and Rhythm: Normal rate and regular rhythm.     Heart sounds: Normal heart sounds. No murmur heard.    No friction rub. No gallop.  Pulmonary:     Effort: Pulmonary effort is normal. No respiratory distress.     Breath sounds: No wheezing or rales.  Chest:     Chest wall: No tenderness.  Abdominal:     General: Bowel sounds are normal.     Tenderness: There is no abdominal tenderness.  Musculoskeletal:        General: No swelling or tenderness. Normal range of motion.     Cervical back: Normal range of motion and neck supple.  Lymphadenopathy:     Cervical: No cervical adenopathy.  Skin:    General: Skin is warm and dry.  Neurological:     Mental Status: She is alert and oriented to person, place, and time.  Psychiatric:        Behavior: Behavior normal.        Thought Content: Thought content normal.        Judgment: Judgment normal.      LABS: No results found for this or any previous visit (from the past 2160 hours).      Assessment/Plan: 1. Encounter for general adult medical examination with abnormal findings (Primary) CPE performed, UTD on PHM, labs ordered  2. Type 2 diabetes mellitus with hyperglycemia, without long-term current use of insulin  (HCC) Will check A1c with labs, continue to monitor and work on diet and exercise - Hgb A1C w/o eAG - Accu-Chek FastClix Lancets MISC; Use as directed to check blood sugars twice a day  E11.65  Dispense: 100 each;  Refill: 1  3. Mixed hyperlipidemia Continue pravastatin  and will update labs - Lipid Panel With LDL/HDL Ratio  4. B12 deficiency - B12 and Folate Panel  5. Vitamin D  deficiency - VITAMIN D  25 Hydroxy (Vit-D Deficiency, Fractures)  6. Thyroid disorder screening - TSH + free T4  7. Other fatigue - Hgb A1C w/o eAG - CBC w/Diff/Platelet - Comprehensive metabolic panel with GFR - TSH + free T4 - Lipid Panel With LDL/HDL Ratio - B12 and Folate Panel - VITAMIN D  25 Hydroxy (Vit-D Deficiency, Fractures) - Fe+TIBC+Fer   General Counseling: Foy verbalizes understanding of the findings of todays visit and agrees with plan of treatment. I have discussed any further diagnostic evaluation  that may be needed or ordered today. We also reviewed her medications today. she has been encouraged to call the office with any questions or concerns that should arise related to todays visit.    Counseling:    Orders Placed This Encounter  Procedures   Hgb A1C w/o eAG   CBC w/Diff/Platelet   Comprehensive metabolic panel with GFR   TSH + free T4   Lipid Panel With LDL/HDL Ratio   B12 and Folate Panel   VITAMIN D  25 Hydroxy (Vit-D Deficiency, Fractures)   Fe+TIBC+Fer    Meds ordered this encounter  Medications   Accu-Chek FastClix Lancets MISC    Sig: Use as directed to check blood sugars twice a day  E11.65    Dispense:  100 each    Refill:  1    This patient was seen by Tinnie Pro, PA-C in collaboration with Dr. Sigrid Bathe as a part of collaborative care agreement.  Total time spent:35 Minutes  Time spent includes review of chart, medications, test results, and follow up plan with the patient.     Sigrid CHRISTELLA Bathe, MD  Internal Medicine  "

## 2024-12-23 NOTE — Addendum Note (Signed)
 Addended by: ALMER BI on: 12/23/2024 11:02 AM   Modules accepted: Orders

## 2024-12-24 LAB — UA/M W/RFLX CULTURE, ROUTINE
Bilirubin, UA: NEGATIVE
Glucose, UA: NEGATIVE
Ketones, UA: NEGATIVE
Leukocytes,UA: NEGATIVE
Nitrite, UA: NEGATIVE
Protein,UA: NEGATIVE
RBC, UA: NEGATIVE
Specific Gravity, UA: 1.022 (ref 1.005–1.030)
Urobilinogen, Ur: 0.2 mg/dL (ref 0.2–1.0)
pH, UA: 5 (ref 5.0–7.5)

## 2024-12-24 LAB — MICROSCOPIC EXAMINATION
Bacteria, UA: NONE SEEN
Casts: NONE SEEN /LPF
RBC, Urine: NONE SEEN /HPF (ref 0–2)
WBC, UA: NONE SEEN /HPF (ref 0–5)

## 2024-12-30 LAB — COMPREHENSIVE METABOLIC PANEL WITH GFR
ALT: 12 IU/L (ref 0–32)
AST: 14 IU/L (ref 0–40)
Albumin: 4.5 g/dL (ref 3.9–4.9)
Alkaline Phosphatase: 100 IU/L (ref 49–135)
BUN/Creatinine Ratio: 22 (ref 12–28)
BUN: 14 mg/dL (ref 8–27)
Bilirubin Total: 0.2 mg/dL (ref 0.0–1.2)
CO2: 22 mmol/L (ref 20–29)
Calcium: 9.9 mg/dL (ref 8.7–10.3)
Chloride: 103 mmol/L (ref 96–106)
Creatinine, Ser: 0.65 mg/dL (ref 0.57–1.00)
Globulin, Total: 1.9 g/dL (ref 1.5–4.5)
Glucose: 183 mg/dL — ABNORMAL HIGH (ref 70–99)
Potassium: 5.1 mmol/L (ref 3.5–5.2)
Sodium: 138 mmol/L (ref 134–144)
Total Protein: 6.4 g/dL (ref 6.0–8.5)
eGFR: 99 mL/min/1.73

## 2024-12-30 LAB — B12 AND FOLATE PANEL
Folate: >20 ng/mL
Vitamin B-12: 972 pg/mL (ref 232–1245)

## 2024-12-30 LAB — LIPID PANEL WITH LDL/HDL RATIO
Cholesterol, Total: 180 mg/dL (ref 100–199)
HDL: 82 mg/dL
LDL Chol Calc (NIH): 82 mg/dL (ref 0–99)
LDL/HDL Ratio: 1 ratio (ref 0.0–3.2)
Triglycerides: 89 mg/dL (ref 0–149)
VLDL Cholesterol Cal: 16 mg/dL (ref 5–40)

## 2024-12-30 LAB — CBC WITH DIFFERENTIAL/PLATELET
Basophils Absolute: 0 x10E3/uL (ref 0.0–0.2)
Basos: 1 %
EOS (ABSOLUTE): 0.1 x10E3/uL (ref 0.0–0.4)
Eos: 2 %
Hematocrit: 38.9 % (ref 34.0–46.6)
Hemoglobin: 12.1 g/dL (ref 11.1–15.9)
Immature Grans (Abs): 0 x10E3/uL (ref 0.0–0.1)
Immature Granulocytes: 0 %
Lymphocytes Absolute: 1.5 x10E3/uL (ref 0.7–3.1)
Lymphs: 42 %
MCH: 26.1 pg — ABNORMAL LOW (ref 26.6–33.0)
MCHC: 31.1 g/dL — ABNORMAL LOW (ref 31.5–35.7)
MCV: 84 fL (ref 79–97)
Monocytes Absolute: 0.3 x10E3/uL (ref 0.1–0.9)
Monocytes: 7 %
Neutrophils Absolute: 1.7 x10E3/uL (ref 1.4–7.0)
Neutrophils: 48 %
Platelets: 266 x10E3/uL (ref 150–450)
RBC: 4.64 x10E6/uL (ref 3.77–5.28)
RDW: 15.4 % (ref 11.7–15.4)
WBC: 3.6 x10E3/uL (ref 3.4–10.8)

## 2024-12-30 LAB — IRON,TIBC AND FERRITIN PANEL
Ferritin: 81 ng/mL (ref 15–150)
Iron Saturation: 25 % (ref 15–55)
Iron: 85 ug/dL (ref 27–139)
Total Iron Binding Capacity: 339 ug/dL (ref 250–450)
UIBC: 254 ug/dL (ref 118–369)

## 2024-12-30 LAB — VITAMIN D 25 HYDROXY (VIT D DEFICIENCY, FRACTURES): Vit D, 25-Hydroxy: 76.6 ng/mL (ref 30.0–100.0)

## 2024-12-30 LAB — TSH+FREE T4
Free T4: 1.22 ng/dL (ref 0.82–1.77)
TSH: 0.94 u[IU]/mL (ref 0.450–4.500)

## 2024-12-30 LAB — HGB A1C W/O EAG: Hgb A1c MFr Bld: 7 % — ABNORMAL HIGH (ref 4.8–5.6)

## 2024-12-31 ENCOUNTER — Other Ambulatory Visit: Payer: Self-pay | Admitting: Physician Assistant

## 2024-12-31 DIAGNOSIS — E1169 Type 2 diabetes mellitus with other specified complication: Secondary | ICD-10-CM

## 2025-01-04 ENCOUNTER — Ambulatory Visit: Payer: Self-pay | Admitting: Physician Assistant

## 2025-01-06 NOTE — Telephone Encounter (Signed)
-----   Message from Tinnie MARLA Pro sent at 01/04/2025  1:13 PM EST ----- Please let her know that overall labs look ok, A1c stable at 7

## 2025-01-06 NOTE — Telephone Encounter (Signed)
 Spoke with patient regarding lab results.

## 2025-01-22 ENCOUNTER — Other Ambulatory Visit: Payer: Self-pay | Admitting: Physician Assistant

## 2025-01-22 DIAGNOSIS — E1165 Type 2 diabetes mellitus with hyperglycemia: Secondary | ICD-10-CM

## 2025-01-26 ENCOUNTER — Other Ambulatory Visit: Payer: Self-pay

## 2025-01-26 DIAGNOSIS — E11649 Type 2 diabetes mellitus with hypoglycemia without coma: Secondary | ICD-10-CM

## 2025-01-26 MED ORDER — SITAGLIPTIN PHOSPHATE 100 MG PO TABS: 100.0000 mg | ORAL_TABLET | Freq: Every day | ORAL | 1 refills | Status: AC

## 2025-03-31 ENCOUNTER — Ambulatory Visit: Admitting: Physician Assistant

## 2025-12-29 ENCOUNTER — Encounter: Admitting: Physician Assistant
